# Patient Record
Sex: Male | Born: 1935
Health system: Southern US, Community
[De-identification: ages and names within clinical notes are randomized; demographics above are authoritative.]

## PROBLEM LIST (undated history)

## (undated) DIAGNOSIS — E785 Hyperlipidemia, unspecified: Secondary | ICD-10-CM

## (undated) DIAGNOSIS — M810 Age-related osteoporosis without current pathological fracture: Secondary | ICD-10-CM

## (undated) DIAGNOSIS — R634 Abnormal weight loss: Secondary | ICD-10-CM

## (undated) DIAGNOSIS — J449 Chronic obstructive pulmonary disease, unspecified: Secondary | ICD-10-CM

## (undated) HISTORY — DX: Age-related osteoporosis without current pathological fracture: M81.0

## (undated) HISTORY — DX: Abnormal weight loss: R63.4

## (undated) HISTORY — DX: Hyperlipidemia, unspecified: E78.5

## (undated) HISTORY — DX: Chronic obstructive pulmonary disease, unspecified: J44.9

## (undated) HISTORY — PX: COLONOSCOPY: SHX174

## (undated) HISTORY — PX: INNER EAR SURGERY: SHX679

---

## 2005-03-17 ENCOUNTER — Ambulatory Visit: Payer: Self-pay | Admitting: Internal Medicine

## 2005-04-06 ENCOUNTER — Ambulatory Visit: Payer: Self-pay | Admitting: Internal Medicine

## 2005-04-14 ENCOUNTER — Ambulatory Visit: Payer: Self-pay | Admitting: Internal Medicine

## 2005-05-05 ENCOUNTER — Ambulatory Visit: Payer: Self-pay | Admitting: Internal Medicine

## 2005-08-19 ENCOUNTER — Ambulatory Visit: Payer: Self-pay | Admitting: Internal Medicine

## 2005-12-15 ENCOUNTER — Ambulatory Visit: Payer: Self-pay | Admitting: Internal Medicine

## 2009-06-11 ENCOUNTER — Ambulatory Visit: Payer: Self-pay | Admitting: Family Medicine

## 2009-06-11 DIAGNOSIS — R319 Hematuria, unspecified: Secondary | ICD-10-CM | POA: Insufficient documentation

## 2009-06-11 DIAGNOSIS — M545 Low back pain, unspecified: Secondary | ICD-10-CM | POA: Insufficient documentation

## 2009-06-11 DIAGNOSIS — N529 Male erectile dysfunction, unspecified: Secondary | ICD-10-CM | POA: Insufficient documentation

## 2009-06-11 DIAGNOSIS — L821 Other seborrheic keratosis: Secondary | ICD-10-CM | POA: Insufficient documentation

## 2009-06-11 LAB — CONVERTED CEMR LAB
Glucose, Urine, Semiquant: NEGATIVE
Ketones, urine, test strip: NEGATIVE
Specific Gravity, Urine: <1.005
pH: 6

## 2009-06-13 ENCOUNTER — Encounter: Payer: Self-pay | Admitting: Family Medicine

## 2009-06-15 LAB — CONVERTED CEMR LAB
Bacteria, UA: NONE SEEN
RBC / HPF: NONE SEEN (ref <3)

## 2009-06-19 ENCOUNTER — Encounter: Payer: Self-pay | Admitting: Family Medicine

## 2009-06-22 LAB — CONVERTED CEMR LAB
AST: 22 units/L (ref 0–37)
Albumin: 4.4 g/dL (ref 3.5–5.2)
Alkaline Phosphatase: 53 units/L (ref 39–117)
BUN: 25 mg/dL — ABNORMAL HIGH (ref 6–23)
Basophils Absolute: 0 10*3/uL (ref 0.0–0.1)
Basophils Relative: 1 % (ref 0–1)
Eosinophils Relative: 3 % (ref 0–5)
Glucose, Bld: 107 mg/dL — ABNORMAL HIGH (ref 70–99)
HCT: 43.6 % (ref 39.0–52.0)
Lymphocytes Relative: 42 % (ref 12–46)
MCHC: 33.3 g/dL (ref 30.0–36.0)
Neutro Abs: 2.6 10*3/uL (ref 1.7–7.7)
PSA: 2.48 ng/mL (ref 0.10–4.00)
Platelets: 229 10*3/uL (ref 150–400)
Potassium: 4.6 meq/L (ref 3.5–5.3)
RDW: 14.2 % (ref 11.5–15.5)
Total Bilirubin: 0.5 mg/dL (ref 0.3–1.2)

## 2010-08-13 ENCOUNTER — Encounter: Payer: Self-pay | Admitting: Family Medicine

## 2010-08-24 ENCOUNTER — Encounter: Admission: RE | Admit: 2010-08-24 | Discharge: 2010-08-24 | Payer: Self-pay | Admitting: Family Medicine

## 2010-08-24 ENCOUNTER — Ambulatory Visit: Payer: Self-pay | Admitting: Family Medicine

## 2010-08-24 DIAGNOSIS — M25519 Pain in unspecified shoulder: Secondary | ICD-10-CM | POA: Insufficient documentation

## 2010-09-01 ENCOUNTER — Telehealth: Payer: Self-pay | Admitting: Family Medicine

## 2010-09-01 DIAGNOSIS — M810 Age-related osteoporosis without current pathological fracture: Secondary | ICD-10-CM

## 2010-09-01 HISTORY — DX: Age-related osteoporosis without current pathological fracture: M81.0

## 2010-09-03 ENCOUNTER — Ambulatory Visit: Payer: Self-pay | Admitting: Family Medicine

## 2010-09-06 LAB — CONVERTED CEMR LAB: Vit D, 25-Hydroxy: 55 ng/mL (ref 30–89)

## 2010-09-23 ENCOUNTER — Encounter: Payer: Self-pay | Admitting: Family Medicine

## 2010-11-23 NOTE — Consult Note (Signed)
Summary: [facility]  [facility]   Imported By: Maryln Gottron 10/01/2010 13:59:58  _____________________________________________________________________  External Attachment:    Type:   Image     Comment:   External Document

## 2010-11-23 NOTE — Progress Notes (Signed)
Summary: DEXA results  Phone Note Outgoing Call   Summary of Call: Call pt: Had to call [facility] to get rull bone density report. after his appt I realized I didn't have complete report.  Even though his t score in his spine was OK, the T score in his hip was low in the osteoporosis range. Make sure taking calcium and vitamin D two times a day . 1200mg  of calcium totoal and 1000iu of vitamin D daily.  In addition I do recommend a bidphosphonate for 3-5 years. Do rec we check a vit D level. Can fax slip.  Initial call taken by: Nani Gasser MD,  September 01, 2010 1:42 PM  Follow-up for Phone Call        spoke with pt's wife and she will notify pt Follow-up by: Avon Gully CMA, Duncan Dull),  September 02, 2010 8:19 AM  New Problems: OSTEOPOROSIS (ICD-733.00)   New Problems: OSTEOPOROSIS (ICD-733.00) New/Updated Medications: ALENDRONATE SODIUM 70 MG TABS (ALENDRONATE SODIUM) one tab by mouth Q week. Prescriptions: ALENDRONATE SODIUM 70 MG TABS (ALENDRONATE SODIUM) one tab by mouth Q week.  #4 x 11   Entered and Authorized by:   Nani Gasser MD   Signed by:   Nani Gasser MD on 09/01/2010   Method used:   Electronically to        [facility] [phone]* (retail)       [address] [place], [place]  [address]       Ph: [phone] or [phone]       Fax: [phone]   RxID:   [id number]

## 2010-11-23 NOTE — Assessment & Plan Note (Signed)
Summary: ELBOW/SHOULDER PAIN-   Vital Signs:  Patient profile:   77 year old male Height:      68 inches Weight:      145 pounds BMI:     22.13 Pulse rate:   85 / minute BP sitting:   148 / 81  (right arm) Cuff size:   regular  Vitals Entered By: Avon Gully CMA, Duncan Dull) (August 24, 2010 3:50 PM) CC: rt shoulder pain and rt elbow pain, hurts every am then doesnt hurt during the day, rt arm feel numb at times   Primary Care Provider:  Nani Gasser, MD  CC:  rt shoulder pain and rt elbow pain, hurts every am then doesnt hurt during the day, and rt arm feel numb at times.  History of Present Illness: rt shoulder pain and rt elbow pain, hurts every am then doesnt hurt during the day, rt arm feel numb at times.  Pain started about 2 weeks ago. No rash.  Waking up around 3 AM  with pain on the posterior shoulder.  And also pain over the elbow. Better after gets moving. No pain or limations of ROm during the day.  Taking IBU. No swelling redness. No trauma or injury.  Tramadol din't help. Most left sided. numbnes on the inside of the forearm that started about a week ago.  Was seen at [facility] about  week ago and had a fairly normal Left elbow and shoudler plain films that only showed mild hypertrophic changes.    Current Medications (verified): 1)  Mv 2)  Fish Oil 1200 Mg Caps (Omega-3 Fatty Acids) .... Take 1 Tablet By Mouth Once A Day 3)  Vit C  Allergies (verified): No Known Drug Allergies  Comments:  Nurse/Medical Assistant: The patient's medications and allergies were reviewed with the patient and were updated in the Medication and Allergy Lists. Avon Gully CMA, Duncan Dull) (August 24, 2010 3:52 PM)  Physical Exam  General:  Well-developed,well-nourished,in no acute distress; alert,appropriate and cooperative throughout examination Msk:  Right shoulder with NROM. NOntender. No swelling or redness over the joint.    Impression &  Recommendations:  Problem # 1:  SHOULDER PAIN (ICD-719.41) BAsed on his sxs and the numbness I would suspect nerve impingement form his neck.  Lets start with plain film and if normal then will tx wiht exercises, IBU, and can use the hydrocone at bedtime. If not better after 2-3 weeks then consider Nerve conduction vs cervical MRI for better evaluation.  Reviewed report form his xrays.  His updated medication list for this problem includes:    Hydrocodone-acetaminophen 5-500 Mg Tabs (Hydrocodone-acetaminophen) ..... One by mouth at bedtime as needed pain.  Orders: T-DG Cervical Spine 2-3 Views (04540)  Complete Medication List: 1)  Mv  2)  Fish Oil 1200 Mg Caps (Omega-3 fatty acids) .... Take 1 tablet by mouth once a day 3)  Vit C  4)  Hydrocodone-acetaminophen 5-500 Mg Tabs (Hydrocodone-acetaminophen) .... One by mouth at bedtime as needed pain. Prescriptions: HYDROCODONE-ACETAMINOPHEN 5-500 MG TABS (HYDROCODONE-ACETAMINOPHEN) one by mouth at bedtime as needed pain.  #30 x 0   Entered and Authorized by:   Nani Gasser MD   Signed by:   Nani Gasser MD on 08/24/2010   Method used:   Print then Give to Patient   RxID:   [id number]    Orders Added: 1)  T-DG Cervical Spine 2-3 Views [72040] 2)  Est. Patient Level III [08657]  Appended Document: ELBOW/SHOULDER PAIN-  Preventive Care Screening  Bone Density:    Date:  08/13/2010    Next Due:  08/2012    Results:  abnormal    Prevention & Chronic Care Immunizations   Influenza vaccine: Not documented    Tetanus booster: 10/24/2005: Historical   Tetanus booster due: 10/25/2015    Pneumococcal vaccine: Historical  (10/24/2005)   Pneumococcal vaccine due: None    H. zoster vaccine: Not documented  Colorectal Screening   Hemoccult: Not documented   Hemoccult due: Not Indicated    Colonoscopy: Not documented  Other Screening   PSA: 2.48  (06/19/2009)   Smoking status: current   (06/11/2009)  Lipids   Total Cholesterol: Not documented   LDL: Not documented   LDL Direct: Not documented   HDL: Not documented   Triglycerides: Not documented

## 2010-11-23 NOTE — Assessment & Plan Note (Signed)
Summary: Arm pain, osteoporosis   Vital Signs:  Patient profile:   77 year old male Height:      68 inches Weight:      145 pounds Pulse rate:   134 / minute BP sitting:   141 / 81  (right arm) Cuff size:   regular  Vitals Entered By: Avon Gully CMA, Duncan Dull) (September 03, 2010 2:44 PM) CC: rt arm still hurting and feels numb   Primary Care Provider:  Nani Gasser, MD  CC:  rt arm still hurting and feels numb.  History of Present Illness: rt arm still hurting and feels numb Did review his cerlvical spine films. he has been using NSAIDs and doing some stretching exercises.Feelt it is imrpving some.  Still some numbness in the medial forearm right labia lid.  He is no pain over the elbow itself.  Still some shoulder pain.his symptoms are still worse at night.    Current Medications (verified): 1)  Mv 2)  Fish Oil 1200 Mg Caps (Omega-3 Fatty Acids) .... Take 1 Tablet By Mouth Once A Day 3)  Vit C 4)  Hydrocodone-Acetaminophen 5-500 Mg Tabs (Hydrocodone-Acetaminophen) .... One By Mouth At Bedtime As Needed Pain. 5)  Alendronate Sodium 70 Mg Tabs (Alendronate Sodium) .... One Tab By Mouth Q Week.  Allergies (verified): No Known Drug Allergies  Comments:  Nurse/Medical Assistant: The patient's medications and allergies were reviewed with the patient and were updated in the Medication and Allergy Lists. Avon Gully CMA, Duncan Dull) (September 03, 2010 2:44 PM)  Physical Exam  General:  Well-developed,well-nourished,in no acute distress; alert,appropriate and cooperative throughout examination Msk:  left elbow with normal range of motion and strength.  Slight decreased sensation over the medial area of the forearm below the elbow.  Strength was normal in the wrist, with normal motion.  Finger strength 5/5.  No tenderness over the antecubital fossa or the bony landmarks of the elbow.  Neck with normal range of motion negative Spurling's.  Shoulder with improved range of  motion.   Impression & Recommendations:  Problem # 1:  SHOULDER PAIN (ICD-719.41) assembly of his pain and numbness is coming more from his neck.  It makes sense that at night while he sleeping in his neck as possibly tilting and in one direction or the other that it may be causing some impingement.  That makes the numbness worse.  He certainly has significant arthritis on his cervical x-ray.  I will refer him to sports medicine/orthopedics for further evaluation.  In the meantime he can use his NSAIDs p.r.n. His updated medication list for this problem includes:    Hydrocodone-acetaminophen 5-500 Mg Tabs (Hydrocodone-acetaminophen) ..... One by mouth at bedtime as needed pain.  Orders: Orthopedic Referral (Ortho)  Problem # 2:  OSTEOPOROSIS (ICD-733.00) I did go over his results with him.  He is 30 pickup his bisphosphonate and took his first dose this morning.  Encouraged to take calcium with vitamin D as well.  He did pepper his vitamin D serum level earlier today.  Repeat DEXA in two years.  Plan to continue bisphosphonate for 3 to 5 years. His updated medication list for this problem includes:    Alendronate Sodium 70 Mg Tabs (Alendronate sodium) ..... One tab by mouth q week.  Complete Medication List: 1)  Mv  2)  Fish Oil 1200 Mg Caps (Omega-3 fatty acids) .... Take 1 tablet by mouth once a day 3)  Vit C  4)  Hydrocodone-acetaminophen 5-500 Mg Tabs (Hydrocodone-acetaminophen) .Marland KitchenMarland KitchenMarland Kitchen  One by mouth at bedtime as needed pain. 5)  Alendronate Sodium 70 Mg Tabs (Alendronate sodium) .... One tab by mouth q week.   Orders Added: 1)  Orthopedic Referral [Ortho] 2)  Est. Patient Level III [11914]

## 2010-12-16 ENCOUNTER — Encounter: Payer: Self-pay | Admitting: Family Medicine

## 2010-12-16 ENCOUNTER — Ambulatory Visit (INDEPENDENT_AMBULATORY_CARE_PROVIDER_SITE_OTHER): Payer: Medicare Other | Admitting: Family Medicine

## 2010-12-16 DIAGNOSIS — R05 Cough: Secondary | ICD-10-CM

## 2010-12-16 DIAGNOSIS — R059 Cough, unspecified: Secondary | ICD-10-CM

## 2010-12-16 DIAGNOSIS — R51 Headache: Secondary | ICD-10-CM

## 2010-12-21 NOTE — Assessment & Plan Note (Signed)
Summary: HA, slight cough   Vital Signs:  Patient profile:   77 year old male Height:      68 inches Weight:      143 pounds O2 Sat:      95 % Temp:     97.9 degrees F oral Pulse rate:   71 / minute BP sitting:   102 / 59  (right arm) Cuff size:   regular  Vitals Entered By: Avon Gully CMA, Duncan Dull) (December 16, 2010 3:23 PM) CC: HA since yesterday,feels bad,cough   Primary Care Provider:  Nani Gasser, MD  CC:  HA since yesterday, feels bad, and cough.  History of Present Illness: HA since yesterday,feels bad,cough. NO myalgia. Severe HA and is pounding. No nasal congestions.  Slight cough, dry. Chills. Taking Tyelnol. NO SOB. NO ear pain.  No GI sxs.  HA over the whole head. Felt like a pressure. NO runny nose. Daughte had similar sxs.  No other sxs.   Current Medications (verified): 1)  Mv 2)  Fish Oil 1200 Mg Caps (Omega-3 Fatty Acids) .... Take 1 Tablet By Mouth Once A Day 3)  Vit C 4)  Alendronate Sodium 70 Mg Tabs (Alendronate Sodium) .... One Tab By Mouth Q Week.  Allergies (verified): No Known Drug Allergies  Comments:  Nurse/Medical Assistant: The patient's medications and allergies were reviewed with the patient and were updated in the Medication and Allergy Lists. Avon Gully CMA, Duncan Dull) (December 16, 2010 3:24 PM)  Physical Exam  General:  Well-developed,well-nourished,in no acute distress; alert,appropriate and cooperative throughout examination Head:  Normocephalic and atraumatic without obvious abnormalities. No apparent alopecia or balding. Eyes:  No corneal or conjunctival inflammation noted. EOMI. Perrla.  Ears:  External ear exam shows no significant lesions or deformities.  Otoscopic examination reveals clear canals, tympanic membranes are intact bilaterally without bulging, retraction, inflammation or discharge. Hearing is grossly normal bilaterally. Nose:  External nasal examination shows no deformity or inflammation.  Mouth:  Oral  mucosa and oropharynx without lesions or exudates.  Teeth in good repair. Neck:  No deformities, masses, or tenderness noted. Lungs:  Normal respiratory effort, chest expands symmetrically. Lungs are clear to auscultation, Coarse BS, no crackles or wheezes. Heart:  Normal rate and regular rhythm. S1 and S2 normal without gallop, murmur, click, rub or other extra sounds. Pulses:  Radial 2+  Neurologic:  alert & oriented X3 and gait normal.   Skin:  no rashes.   Cervical Nodes:  No lymphadenopathy noted Psych:  Cognition and judgment appear intact. Alert and cooperative with normal attention span and concentration. No apparent delusions, illusions, hallucinations   Impression & Recommendations:  Problem # 1:  HEADACHE (ICD-784.0) LIkely viral etiiology since also getting chills. Can use the Tyelnol for his HA since does seem to help and he is actually feeling some better today. Exam is normal. GAve reassurance that there is not sign of bronchitis or PNA. Pulse ox is at baseline.  The following medications were removed from the medication list:    Hydrocodone-acetaminophen 5-500 Mg Tabs (Hydrocodone-acetaminophen) ..... One by mouth at bedtime as needed pain.  Problem # 2:  COUGH (ICD-786.2) Cough is slight. No sure is early sxs of viral illnes. Asked him to call if gets worse wtih cough or gets SOB.  Exam is normal except for coasrse BS which is his baseline. Pulse ox at baseline as well.  No sign of bronchitis or PNA on exam.   Complete Medication List: 1)  Mv  2)  Fish Oil 1200 Mg Caps (Omega-3 fatty acids) .... Take 1 tablet by mouth once a day 3)  Vit C  4)  Alendronate Sodium 70 Mg Tabs (Alendronate sodium) .... One tab by mouth q week.  Patient Instructions: 1)  Call if gets worse or not better on Monday.    Orders Added: 1)  Est. Patient Level III [85462]    Prevention & Chronic Care Immunizations   Influenza vaccine: Not documented    Tetanus booster: 10/24/2005:  Historical   Tetanus booster due: 10/25/2015    Pneumococcal vaccine: Historical  (10/24/2005)   Pneumococcal vaccine due: None    H. zoster vaccine: Not documented  Colorectal Screening   Hemoccult: Not documented   Hemoccult due: Not Indicated    Colonoscopy: Not documented  Other Screening   PSA: 2.48  (06/19/2009)   PSA due due: 06/19/2010   Smoking status: current  (06/11/2009)  Lipids   Total Cholesterol: Not documented   LDL: Not documented   LDL Direct: Not documented   HDL: Not documented   Triglycerides: Not documented

## 2012-05-14 DIAGNOSIS — R972 Elevated prostate specific antigen [PSA]: Secondary | ICD-10-CM | POA: Diagnosis not present

## 2014-03-19 DIAGNOSIS — H2510 Age-related nuclear cataract, unspecified eye: Secondary | ICD-10-CM | POA: Diagnosis not present

## 2014-12-12 DIAGNOSIS — H6123 Impacted cerumen, bilateral: Secondary | ICD-10-CM | POA: Diagnosis not present

## 2014-12-15 ENCOUNTER — Encounter: Payer: Self-pay | Admitting: Family Medicine

## 2014-12-15 ENCOUNTER — Ambulatory Visit (INDEPENDENT_AMBULATORY_CARE_PROVIDER_SITE_OTHER): Payer: Medicare Other

## 2014-12-15 ENCOUNTER — Ambulatory Visit (INDEPENDENT_AMBULATORY_CARE_PROVIDER_SITE_OTHER): Payer: Medicare Other | Admitting: Family Medicine

## 2014-12-15 ENCOUNTER — Encounter (INDEPENDENT_AMBULATORY_CARE_PROVIDER_SITE_OTHER): Payer: Self-pay

## 2014-12-15 VITALS — BP 124/67 | HR 70 | Ht 69.0 in | Wt 136.0 lb

## 2014-12-15 DIAGNOSIS — R05 Cough: Secondary | ICD-10-CM

## 2014-12-15 DIAGNOSIS — J439 Emphysema, unspecified: Secondary | ICD-10-CM | POA: Insufficient documentation

## 2014-12-15 DIAGNOSIS — J449 Chronic obstructive pulmonary disease, unspecified: Secondary | ICD-10-CM

## 2014-12-15 DIAGNOSIS — R059 Cough, unspecified: Secondary | ICD-10-CM

## 2014-12-15 DIAGNOSIS — M810 Age-related osteoporosis without current pathological fracture: Secondary | ICD-10-CM | POA: Diagnosis not present

## 2014-12-15 DIAGNOSIS — Z0000 Encounter for general adult medical examination without abnormal findings: Secondary | ICD-10-CM

## 2014-12-15 DIAGNOSIS — H6123 Impacted cerumen, bilateral: Secondary | ICD-10-CM | POA: Diagnosis not present

## 2014-12-15 LAB — COMPLETE METABOLIC PANEL WITH GFR
ALT: 17 U/L (ref 0–53)
AST: 22 U/L (ref 0–37)
Albumin: 4 g/dL (ref 3.5–5.2)
Alkaline Phosphatase: 54 U/L (ref 39–117)
BILIRUBIN TOTAL: 0.7 mg/dL (ref 0.2–1.2)
BUN: 25 mg/dL — AB (ref 6–23)
CHLORIDE: 102 meq/L (ref 96–112)
CO2: 31 mEq/L (ref 19–32)
CREATININE: 1.14 mg/dL (ref 0.50–1.35)
Calcium: 9.5 mg/dL (ref 8.4–10.5)
GFR, EST AFRICAN AMERICAN: 71 mL/min
GFR, Est Non African American: 61 mL/min
GLUCOSE: 97 mg/dL (ref 70–99)
POTASSIUM: 4.5 meq/L (ref 3.5–5.3)
Sodium: 140 mEq/L (ref 135–145)
Total Protein: 6.8 g/dL (ref 6.0–8.3)

## 2014-12-15 LAB — LIPID PANEL
CHOLESTEROL: 203 mg/dL — AB (ref 0–200)
HDL: 88 mg/dL (ref >=40)
LDL Cholesterol: 97 mg/dL (ref 0–99)
Total CHOL/HDL Ratio: 2.3 Ratio
Triglycerides: 90 mg/dL (ref <150)
VLDL: 18 mg/dL (ref 0–40)

## 2014-12-15 NOTE — Progress Notes (Signed)
CC: Timothy Waller is a 81 y.o. male is here for Establish Care   Subjective: HPI:  Colonoscopy: Most recent 5 years ago reportedly normal, requesting outside records. Prostate: Discussed screening risks/beneifts with patient today, no known risk of abnormals, he would like PSA checked today   AAA Screen: Unsure if this is ever been done, requesting outside records  DEXA: History of osteoporosis with last DEXA scan 5 years ago, orders placed today  Influenza Vaccine: Up-to-date Pneumovax: Up-to-date Td/Tdap: Up-to-date Zoster: Already received  Very pleasant 81 year old here to establish care, he is requesting complete physical exam and also has bilateral hearing loss with thick wax discharge. He's had difficulty with cerumen impactions multiple times in the past and was seen for this at a local urgent care clinic where they were unable to remove the impactions bilaterally.  He also complains of difficulty gaining weight that has been present for the majority of his adult life. He denies any weight loss but tells me he can eat whatever he wants never gains a pound.  He is a smoker and has a daily cough he does not think he's ever had a chest x-ray. Cough has been present for at least 3 months now  Review of Systems - General ROS: negative for - chills, fever, night sweats, weight gain or weight loss Ophthalmic ROS: negative for - decreased vision Psychological ROS: negative for - anxiety or depression ENT ROS: negative for - , nasal congestion, tinnitus or allergies Hematological and Lymphatic ROS: negative for - bleeding problems, bruising or swollen lymph nodes Breast ROS: negative Respiratory ROS: no , shortness of breath, or wheezing Cardiovascular ROS: no chest pain or dyspnea on exertion Gastrointestinal ROS: no abdominal pain, change in bowel habits, or black or bloody stools Genito-Urinary ROS: negative for - genital discharge, genital ulcers, incontinence or abnormal  bleeding from genitals Musculoskeletal ROS: negative for - joint pain or muscle pain Neurological ROS: negative for - headaches or memory loss Dermatological ROS: negative for lumps, mole changes, rash and skin lesion changes  Past Medical History  Diagnosis Date  . Osteoporosis 09/01/2010    Qualifier: Diagnosis of  By: McCrimmon CMA, (AAMA), Seth Bake      No past surgical history on file. No family history on file.  History   Social History  . Marital Status: Married    Spouse Name: N/A  . Number of Children: N/A  . Years of Education: N/A   Occupational History  . Not on file.   Social History Main Topics  . Smoking status: Current Every Day Smoker -- 0.50 packs/day for 20 years    Types: Cigarettes  . Smokeless tobacco: Not on file  . Alcohol Use: Not on file  . Drug Use: No  . Sexual Activity: Not Currently   Other Topics Concern  . Not on file   Social History Narrative  . No narrative on file     Objective: BP 124/67 mmHg  Pulse 70  Ht 5\' 9"  (1.753 m)  Wt 136 lb (61.689 kg)  BMI 20.07 kg/m2  General: No Acute Distress HEENT: Atraumatic, normocephalic, conjunctivae normal without scleral icterus.  No nasal discharge, on initial exam hearing is moderately impaired and both ear canals have cerumen impactions, following removal of the impactions hearing grossly intact, TMs with good landmarks bilaterally with no middle ear abnormalities, posterior pharynx clear without oral lesions. Neck: Supple, trachea midline, no cervical nor supraclavicular adenopathy. Pulmonary: Comfortable breathing with trace end expiratory wheezing in the  lower lung fields. No rhonchi nor rales. Cardiac: Regular rate and rhythm.  No murmurs, rubs, nor gallops. No peripheral edema.  2+ peripheral pulses bilaterally. Abdomen: Bowel sounds normal.  No masses.  Non-tender without rebound.  Negative Murphy's sign. MSK: Grossly intact, no signs of weakness.  Full strength throughout upper and  lower extremities.  Full ROM in upper and lower extremities.  No midline spinal tenderness. Neuro: Gait unremarkable, CN II-XII grossly intact.  C5-C6 Reflex 2/4 Bilaterally, L4 Reflex 2/4 Bilaterally.  Cerebellar function intact. Skin: No rashes. Psych: Alert and oriented to person/place/time.  Thought process normal. No anxiety/depression.  Assessment & Plan: Timothy Waller was seen today for establish care.  Diagnoses and all orders for this visit:  Annual physical exam Orders: -     Lipid panel -     COMPLETE METABOLIC PANEL WITH GFR -     PSA  Cerumen impaction, bilateral  Osteoporosis Orders: -     DG Bone Density; Future  Cough Orders: -     DG Chest 2 View; Future   Indication: Cerumen impaction of the ears Medical necessity statement: On physical examination, cerumen impairs clinically significant portions of the external auditory canal, and tympanic membrane. Noted obstructive, copious cerumen that cannot be removed without magnification and instrumentations requiring physician skills Consent: Discussed benefits and risks of procedure and verbal consent obtained Procedure: Patient was prepped for the procedure. Utilized an otoscope to assess and take note of the ear canal, the tympanic membrane, and the presence, amount, and placement of the cerumen. Gentle water irrigation and soft plastic curette was utilized to remove cerumen.  Post procedure examination: shows cerumen was completely removed. Patient tolerated procedure well. The patient is made aware that they may experience temporary vertigo, temporary hearing loss, and temporary discomfort. If these symptom last for more than 24 hours to call the clinic or proceed to the ED.   Healthy lifestyle interventions including but not limited to regular exercise, a healthy low fat diet, moderation of salt intake, the dangers of tobacco/alcohol/recreational drug use, nutrition supplementation, and accident avoidance were discussed  with the patient and a handout was provided for future reference.  Provided with stool cards pending most recent colonoscopy records. Checking an x-ray given difficulty losing weight and chronic cough to screen for lung cancer.  Return in about 3 months (around 03/15/2015).

## 2014-12-16 ENCOUNTER — Telehealth: Payer: Self-pay | Admitting: Family Medicine

## 2014-12-16 DIAGNOSIS — E785 Hyperlipidemia, unspecified: Secondary | ICD-10-CM

## 2014-12-16 LAB — PSA: PSA: 3.89 ng/mL (ref <=4.00)

## 2014-12-16 MED ORDER — ATORVASTATIN CALCIUM 10 MG PO TABS: 10.0000 mg | ORAL_TABLET | Freq: Every day | ORAL | Status: DC

## 2014-12-16 NOTE — Telephone Encounter (Signed)
Timothy Waller, Will you please let patient know tha this kidney function, liver function, blood sugar and PSA prostate test were all normal.  His cholesterol values and age would put him at a 25% risk of having a heart attack in the next 10 years.  I would recommend that he start a cholesterol lowering medication called atorvastatin to help reduce this risk.  Rx sent to [facility] on [address].

## 2014-12-16 NOTE — Telephone Encounter (Signed)
Pt.notified

## 2015-02-13 ENCOUNTER — Other Ambulatory Visit: Payer: Medicare Other | Admitting: Family Medicine

## 2015-02-20 ENCOUNTER — Ambulatory Visit (INDEPENDENT_AMBULATORY_CARE_PROVIDER_SITE_OTHER): Payer: Medicare Other | Admitting: Family Medicine

## 2015-02-20 VITALS — BP 144/68 | HR 62 | Ht 69.0 in | Wt 134.0 lb

## 2015-02-20 DIAGNOSIS — J439 Emphysema, unspecified: Secondary | ICD-10-CM | POA: Diagnosis not present

## 2015-02-20 DIAGNOSIS — R05 Cough: Secondary | ICD-10-CM

## 2015-02-20 DIAGNOSIS — R059 Cough, unspecified: Secondary | ICD-10-CM

## 2015-02-20 DIAGNOSIS — Z1211 Encounter for screening for malignant neoplasm of colon: Secondary | ICD-10-CM | POA: Diagnosis not present

## 2015-02-20 MED ORDER — ALBUTEROL SULFATE (2.5 MG/3ML) 0.083% IN NEBU
2.5000 mg | INHALATION_SOLUTION | Freq: Once | RESPIRATORY_TRACT | Status: AC
  Administered 2015-02-20: 2.5 mg via RESPIRATORY_TRACT

## 2015-02-20 MED ORDER — BUDESONIDE-FORMOTEROL FUMARATE 160-4.5 MCG/ACT IN AERO: 2.0000 | INHALATION_SPRAY | Freq: Two times a day (BID) | RESPIRATORY_TRACT | Status: DC

## 2015-02-20 NOTE — Progress Notes (Signed)
CC: Timothy Waller is a 81 y.o. male is here for No chief complaint on file.   Subjective: HPI:  Follow-up chronic cough: He continues to have a daily cough that is nonproductive occurring multiple times each hour of the day. No nocturnal symptoms. He's cutting back on smoking. Cough is moderate in severity nothing particularly makes it better or worse. He denies any degree of shortness of breath whatsoever at rest or even with exertion. Denies chest pain wheezing, blood in sputum, orthopnea peripheral edema fevers or chills   Review Of Systems Outlined In HPI  Past Medical History  Diagnosis Date  . Osteoporosis 09/01/2010    Qualifier: Diagnosis of  By: McCrimmon CMA, (AAMA), Seth Bake      No past surgical history on file. Family History  Problem Relation Age of Onset  . Cancer Father     mouth cancer     History   Social History  . Marital Status: Married    Spouse Name: N/A  . Number of Children: N/A  . Years of Education: N/A   Occupational History  . Not on file.   Social History Main Topics  . Smoking status: Current Every Day Smoker -- 0.50 packs/day for 20 years    Types: Cigarettes  . Smokeless tobacco: Not on file  . Alcohol Use: Not on file  . Drug Use: No  . Sexual Activity: Not Currently   Other Topics Concern  . Not on file   Social History Narrative  . No narrative on file     Objective: BP 144/68 mmHg  Pulse 62  Ht 5\' 9"  (1.753 m)  Wt 134 lb (60.782 kg)  BMI 19.78 kg/m2  SpO2 96%  General: Alert and Oriented, No Acute Distress HEENT: Pupils equal, round, reactive to light. Conjunctivae clear.  Moist mucous membranes Lungs: Clear to auscultation bilaterally, no wheezing/ronchi/rales.  Comfortable work of breathing. Good air movement. Cardiac: Regular rate and rhythm. Normal S1/S2.  No murmurs, rubs, nor gallops.   Extremities: No peripheral edema.  Strong peripheral pulses.  Mental Status: No depression, anxiety, nor agitation. Skin: Warm and  dry.  Assessment & Plan: Diagnoses and all orders for this visit:  Cough Orders: -     Spirometry with graph -     albuterol (PROVENTIL) (2.5 MG/3ML) 0.083% nebulizer solution 2.5 mg; Take 3 mLs (2.5 mg total) by nebulization once.  Pulmonary emphysema, unspecified emphysema type  Special screening for malignant neoplasms, colon Orders: -     Ambulatory referral to Gastroenterology  Other orders -     budesonide-formoterol (SYMBICORT) 160-4.5 MCG/ACT inhaler; Inhale 2 puffs into the lungs 2 (two) times daily.   Cough: Pulmonary function testing was obtained showing severe airway obstruction with low vital capacity. No improvement objectively with bronchodilator however he reports significant improvement with a sensation of decreased cough and lung expansion. Encouraged trial of Symbicort and if tolerated please call for formal prescription. Emphasized the importance of complete smoking cessation to minimize further decline in lung function and lung disease.  He tells me he's ready to go through with a colonoscopy that he believes he is overdue for  25 minutes spent face-to-face during visit today of which at least 50% was counseling or coordinating care regarding: 1. Cough   2. Pulmonary emphysema, unspecified emphysema type   3. Special screening for malignant neoplasms, colon      Return in about 3 months (around 05/22/2015).

## 2015-03-05 ENCOUNTER — Encounter: Payer: Self-pay | Admitting: Family Medicine

## 2015-03-05 DIAGNOSIS — Z1211 Encounter for screening for malignant neoplasm of colon: Secondary | ICD-10-CM | POA: Insufficient documentation

## 2015-04-13 ENCOUNTER — Ambulatory Visit (INDEPENDENT_AMBULATORY_CARE_PROVIDER_SITE_OTHER): Payer: Medicare Other

## 2015-04-13 ENCOUNTER — Encounter: Payer: Self-pay | Admitting: Family Medicine

## 2015-04-13 ENCOUNTER — Ambulatory Visit (INDEPENDENT_AMBULATORY_CARE_PROVIDER_SITE_OTHER): Payer: Medicare Other | Admitting: Family Medicine

## 2015-04-13 VITALS — BP 132/64 | HR 70 | Wt 136.0 lb

## 2015-04-13 DIAGNOSIS — M19041 Primary osteoarthritis, right hand: Secondary | ICD-10-CM | POA: Diagnosis not present

## 2015-04-13 DIAGNOSIS — M7731 Calcaneal spur, right foot: Secondary | ICD-10-CM | POA: Diagnosis not present

## 2015-04-13 DIAGNOSIS — M109 Gout, unspecified: Secondary | ICD-10-CM

## 2015-04-13 DIAGNOSIS — M79671 Pain in right foot: Secondary | ICD-10-CM | POA: Diagnosis not present

## 2015-04-13 MED ORDER — PREDNISONE 20 MG PO TABS
ORAL_TABLET | ORAL | Status: AC
Start: 2015-04-13 — End: 2015-04-26

## 2015-04-13 NOTE — Progress Notes (Signed)
CC: Timothy Waller is a 81 y.o. male is here for Foot Swelling   Subjective: HPI:  Abrupt onset of pain, redness, swelling at the base of the fifth toe on the right foot that began one week ago. No interventions as of yet. Hurts to touch or wear shoes. No pain with weightbearing. He's never had this before. Swelling has slightly improved but redness is persistent. Denies joint pain elsewhere. Denies any recent or remote trauma or any accident to the right foot. Denies fevers, chills, nor swelling of any other parts of the appendages.   Review Of Systems Outlined In HPI  Past Medical History  Diagnosis Date  . Osteoporosis 09/01/2010    Qualifier: Diagnosis of  By: McCrimmon CMA, (AAMA), Seth Bake      No past surgical history on file. Family History  Problem Relation Age of Onset  . Cancer Father     mouth cancer     History   Social History  . Marital Status: Married    Spouse Name: N/A  . Number of Children: N/A  . Years of Education: N/A   Occupational History  . Not on file.   Social History Main Topics  . Smoking status: Current Every Day Smoker -- 0.50 packs/day for 20 years    Types: Cigarettes  . Smokeless tobacco: Not on file  . Alcohol Use: Not on file  . Drug Use: No  . Sexual Activity: Not Currently   Other Topics Concern  . Not on file   Social History Narrative     Objective: BP 132/64 mmHg  Pulse 70  Wt 136 lb (61.689 kg)  Vital signs reviewed. General: Alert and Oriented, No Acute Distress HEENT: Pupils equal, round, reactive to light. Conjunctivae clear.  External ears unremarkable.  Moist mucous membranes. Lungs: Clear and comfortable work of breathing, speaking in full sentences without accessory muscle use. Cardiac: Regular rate and rhythm.  Neuro: CN II-XII grossly intact, gait normal. Extremities: No peripheral edema.  Strong peripheral pulses. Moderate erythema and swelling on the dorsal surface of the base of the right fifth toe. Slightly  tender to the touch moderately warm to the touch. Mental Status: No depression, anxiety, nor agitation. Logical though process. Skin: Warm and dry.  Assessment & Plan: Timothy Waller was seen today for foot swelling.  Diagnoses and all orders for this visit:  Right foot pain Orders: -     DG Foot Complete Right; Future  Gout without tophus, unspecified cause, unspecified chronicity, unspecified site Orders: -     predniSONE (DELTASONE) 20 MG tablet; Three tabs daily days 1-3, two tabs daily days 4-6, one tab daily days 7-9, half tab daily days 10-13.   X-ray was obtained to rule out osteoarthritis or fracture at the base of the fifth toe, x-rays were reassuring supporting a diagnosis of gout. Begin prednisone taper.  Return if symptoms worsen or fail to improve.

## 2016-01-05 ENCOUNTER — Other Ambulatory Visit: Payer: Self-pay | Admitting: Family Medicine

## 2016-01-13 ENCOUNTER — Telehealth: Payer: Self-pay | Admitting: Family Medicine

## 2016-01-13 NOTE — Telephone Encounter (Signed)
I called pt and left message with his wife to call our office and schedule his F/u appt with Dr.Hommel for med refills

## 2016-01-19 ENCOUNTER — Ambulatory Visit (INDEPENDENT_AMBULATORY_CARE_PROVIDER_SITE_OTHER): Payer: Medicare Other | Admitting: Family Medicine

## 2016-01-19 ENCOUNTER — Encounter: Payer: Self-pay | Admitting: Family Medicine

## 2016-01-19 VITALS — BP 155/76 | HR 58 | Wt 131.0 lb

## 2016-01-19 DIAGNOSIS — Z1211 Encounter for screening for malignant neoplasm of colon: Secondary | ICD-10-CM

## 2016-01-19 DIAGNOSIS — J439 Emphysema, unspecified: Secondary | ICD-10-CM

## 2016-01-19 DIAGNOSIS — E785 Hyperlipidemia, unspecified: Secondary | ICD-10-CM | POA: Diagnosis not present

## 2016-01-19 LAB — LIPID PANEL
Cholesterol: 160 mg/dL (ref 125–200)
HDL: 94 mg/dL (ref >=40)
LDL CALC: 57 mg/dL (ref <130)
Total CHOL/HDL Ratio: 1.7 Ratio (ref <=5.0)
Triglycerides: 46 mg/dL (ref <150)
VLDL: 9 mg/dL (ref <30)

## 2016-01-19 NOTE — Progress Notes (Signed)
CC: Timothy Waller is a 82 y.o. male is here for Hyperlipidemia   Subjective: HPI:  Follow-up hyperlipidemia: Ever since starting atorvastatin he denies any right upper quadrant pain or myalgias. He denies any chest pain or limb claudication. No motor or sensory disturbances.  Follow-up COPD: Still smoking but has cut back drastically on his daily cigarette use. He reports a daily cough but no increased sputum or increased coughing frequency. Denies any shortness of breath or wheezing. Rarely using Symbicort  He knows it is due to have a colonoscopy however he has reservations about the procedure due to fears of complications. He wants noninvasive way to screen for colon cancer.   Review Of Systems Outlined In HPI  Past Medical History  Diagnosis Date  . Osteoporosis 09/01/2010    Qualifier: Diagnosis of  By: McCrimmon CMA, (AAMA), Seth Bake      No past surgical history on file. Family History  Problem Relation Age of Onset  . Cancer Father     mouth cancer     Social History   Social History  . Marital Status: Married    Spouse Name: N/A  . Number of Children: N/A  . Years of Education: N/A   Occupational History  . Not on file.   Social History Main Topics  . Smoking status: Current Every Day Smoker -- 0.50 packs/day for 20 years    Types: Cigarettes  . Smokeless tobacco: Not on file  . Alcohol Use: Not on file  . Drug Use: No  . Sexual Activity: Not Currently   Other Topics Concern  . Not on file   Social History Narrative     Objective: BP 155/76 mmHg  Pulse 58  Wt 131 lb (59.421 kg)  General: Alert and Oriented, No Acute Distress HEENT: Pupils equal, round, reactive to light. Conjunctivae clear. Moist mucous membranes Lungs: Clear to auscultation bilaterally, no wheezing/ronchi/rales.  Comfortable work of breathing. Good air movement. Cardiac: Regular rate and rhythm. Normal S1/S2.  No murmurs, rubs, nor gallops.   Extremities: No peripheral edema.   Strong peripheral pulses.  Mental Status: No depression, anxiety, nor agitation. Skin: Warm and dry.  Assessment & Plan: Timothy Waller was seen today for hyperlipidemia.  Diagnoses and all orders for this visit:  Hyperlipidemia -     Lipid panel  Pulmonary emphysema, unspecified emphysema type ([place])  Special screening for malignant neoplasms, colon   Hyperlipidemia: Clinical control due for repeat lipid panel, he'll need 90 day supplies of atorvastatin if controlled COPD: Controlled, congratulated his cutting back on cigarettes. Encouraged complete cessation Ordering Cologuard to screen for colon cancer.  Return in about 6 months (around 07/21/2016).

## 2016-01-20 ENCOUNTER — Telehealth: Payer: Self-pay | Admitting: Family Medicine

## 2016-01-20 MED ORDER — ATORVASTATIN CALCIUM 10 MG PO TABS: ORAL_TABLET | ORAL | Status: DC

## 2016-01-20 NOTE — Telephone Encounter (Signed)
Will you please let patient know that his cholesterol numbers look well controlled and I've sent a years worth of refills to his [facility].

## 2016-01-20 NOTE — Telephone Encounter (Signed)
Wife notified.

## 2016-01-25 DIAGNOSIS — Z1211 Encounter for screening for malignant neoplasm of colon: Secondary | ICD-10-CM | POA: Diagnosis not present

## 2016-01-25 DIAGNOSIS — Z1212 Encounter for screening for malignant neoplasm of rectum: Secondary | ICD-10-CM | POA: Diagnosis not present

## 2016-02-05 LAB — COLOGUARD: Cologuard: POSITIVE

## 2016-02-08 ENCOUNTER — Telehealth: Payer: Self-pay | Admitting: Family Medicine

## 2016-02-08 DIAGNOSIS — R195 Other fecal abnormalities: Secondary | ICD-10-CM

## 2016-02-08 NOTE — Telephone Encounter (Signed)
Pt number on file and emergency contact number are currently not working

## 2016-02-08 NOTE — Telephone Encounter (Signed)
Will you please let patient know that his cologuard test was abnormal and this result can indicate the presence of cancer or a pre-cancerous polyp. The next step in workup is to go through with a colonoscopy so I'll refer him to a gastroenterologist who will help arrange this.

## 2016-02-09 ENCOUNTER — Encounter: Payer: Self-pay | Admitting: Family Medicine

## 2016-02-09 ENCOUNTER — Ambulatory Visit (INDEPENDENT_AMBULATORY_CARE_PROVIDER_SITE_OTHER): Payer: Medicare Other | Admitting: Family Medicine

## 2016-02-09 VITALS — BP 160/75 | HR 62 | Wt 131.0 lb

## 2016-02-09 DIAGNOSIS — M810 Age-related osteoporosis without current pathological fracture: Secondary | ICD-10-CM

## 2016-02-09 DIAGNOSIS — I10 Essential (primary) hypertension: Secondary | ICD-10-CM

## 2016-02-09 NOTE — Telephone Encounter (Signed)
Pt notified during visit  

## 2016-02-09 NOTE — Progress Notes (Signed)
CC: Timothy Waller is a 82 y.o. male is here for Hypertension   Subjective: HPI:  Follow essential hypertension: At home he is consistently seeing blood pressure readings below 130/80. Last week he started to notice if he stands up quickly he'll experience some lightheadedness. Symptoms sudden persistent ever since onset. Mild in severity but affecting quality of life. There's been no changes to his medication regimen. He denies any chest pain, chest discomfort, irregular heartbeat or any other motor or sensory disturbances. Symptoms occur 1 or 2 times a day. He'll check his blood pressure around this time and it's always close to 100/70, he never sees anything above 140/90. His used two separate blood pressure cuffs now that confirm these readings. He tells me he doesn't add salt in his food and he drinks far less than 8,8 ounce glasses of water a day   Review Of Systems Outlined In HPI  Past Medical History  Diagnosis Date  . Osteoporosis 09/01/2010    Qualifier: Diagnosis of  By: McCrimmon CMA, (AAMA), Timothy Waller      No past surgical history on file. Family History  Problem Relation Age of Onset  . Cancer Father     mouth cancer     Social History   Social History  . Marital Status: Married    Spouse Name: N/A  . Number of Children: N/A  . Years of Education: N/A   Occupational History  . Not on file.   Social History Main Topics  . Smoking status: Current Every Day Smoker -- 0.50 packs/day for 20 years    Types: Cigarettes  . Smokeless tobacco: Not on file  . Alcohol Use: Not on file  . Drug Use: No  . Sexual Activity: Not Currently   Other Topics Concern  . Not on file   Social History Narrative     Objective: BP 160/75 mmHg  Pulse 62  Wt 131 lb (59.421 kg)  General: Alert and Oriented, No Acute Distress HEENT: Pupils equal, round, reactive to light. Conjunctivae clear. Moist mucous membranes Lungs: Clear to auscultation bilaterally, no wheezing/ronchi/rales.   Comfortable work of breathing. Good air movement. Cardiac: Regular rate and rhythm. Normal S1/S2.  No murmurs, rubs, nor gallops.  No carotid bruit Extremities: No peripheral edema.  Strong peripheral pulses.  Mental Status: No depression, anxiety, nor agitation. Skin: Warm and dry.  Assessment & Plan: Timothy Waller was seen today for hypertension.  Diagnoses and all orders for this visit:  Essential hypertension  Osteoporosis -     DG Bone Density; Future   Essential hypertension: Elevated here however normotensive at home. His orthostatic symptoms would be worsened if he started on any blood pressure medication therefore holding off on this. I encouraged him to start drinking 8, 8 ounce glasses of water a day and add a teaspoon of salt to his meal over the course of the day. If symptoms do not improve after this for 2-3 days for blood pressure rises well 140/90 please call for further evaluation. Overdue for DEXA scan orders placed  No Follow-up on file.

## 2016-02-10 ENCOUNTER — Encounter: Payer: Self-pay | Admitting: Family Medicine

## 2016-02-16 ENCOUNTER — Encounter: Payer: Self-pay | Admitting: Internal Medicine

## 2016-02-17 ENCOUNTER — Encounter: Payer: Self-pay | Admitting: Family Medicine

## 2016-02-17 ENCOUNTER — Ambulatory Visit (INDEPENDENT_AMBULATORY_CARE_PROVIDER_SITE_OTHER): Payer: Medicare Other

## 2016-02-17 DIAGNOSIS — M85851 Other specified disorders of bone density and structure, right thigh: Secondary | ICD-10-CM

## 2016-02-17 DIAGNOSIS — M8589 Other specified disorders of bone density and structure, multiple sites: Secondary | ICD-10-CM | POA: Diagnosis not present

## 2016-02-17 DIAGNOSIS — M810 Age-related osteoporosis without current pathological fracture: Secondary | ICD-10-CM

## 2016-02-17 DIAGNOSIS — M8580 Other specified disorders of bone density and structure, unspecified site: Secondary | ICD-10-CM | POA: Insufficient documentation

## 2016-03-03 ENCOUNTER — Telehealth: Payer: Self-pay

## 2016-03-03 NOTE — Telephone Encounter (Signed)
Dr Henrene Pastor,      [phone].  COPD.  Colonoscopy with [facility] in 2004.  Will further assess health status @ [facility].  If pt appears frail or @ high risk will contact you again for possible OV.                                                                                                                                                                                                                                          Thank you, Angela/[facility]

## 2016-03-03 NOTE — Telephone Encounter (Signed)
May not need "routine" screening colonoscopy at 59 with comorbidities

## 2016-03-16 ENCOUNTER — Encounter: Payer: Medicare Other | Admitting: Internal Medicine

## 2016-04-19 ENCOUNTER — Ambulatory Visit (AMBULATORY_SURGERY_CENTER): Payer: Self-pay

## 2016-04-19 ENCOUNTER — Encounter: Payer: Medicare Other | Admitting: Internal Medicine

## 2016-04-19 VITALS — Ht 68.0 in | Wt 127.0 lb

## 2016-04-19 DIAGNOSIS — R195 Other fecal abnormalities: Secondary | ICD-10-CM

## 2016-04-19 MED ORDER — NA SULFATE-K SULFATE-MG SULF 17.5-3.13-1.6 GM/177ML PO SOLN: ORAL | Status: DC

## 2016-04-19 NOTE — Progress Notes (Signed)
Per pt, no allergies to soy or egg products.Pt not taking any weight loss meds or using  O2 at home. 

## 2016-05-03 ENCOUNTER — Encounter: Payer: Self-pay | Admitting: Internal Medicine

## 2016-05-03 ENCOUNTER — Ambulatory Visit (AMBULATORY_SURGERY_CENTER): Payer: Medicare Other | Admitting: Internal Medicine

## 2016-05-03 VITALS — BP 126/63 | HR 64 | Temp 98.9°F | Resp 13 | Ht 68.0 in | Wt 127.0 lb

## 2016-05-03 DIAGNOSIS — D122 Benign neoplasm of ascending colon: Secondary | ICD-10-CM | POA: Diagnosis not present

## 2016-05-03 DIAGNOSIS — D120 Benign neoplasm of cecum: Secondary | ICD-10-CM

## 2016-05-03 DIAGNOSIS — K635 Polyp of colon: Secondary | ICD-10-CM | POA: Diagnosis not present

## 2016-05-03 DIAGNOSIS — Z1211 Encounter for screening for malignant neoplasm of colon: Secondary | ICD-10-CM | POA: Diagnosis not present

## 2016-05-03 DIAGNOSIS — R195 Other fecal abnormalities: Secondary | ICD-10-CM | POA: Diagnosis present

## 2016-05-03 MED ORDER — SODIUM CHLORIDE 0.9 % IV SOLN: 500.0000 mL | INTRAVENOUS | Status: DC

## 2016-05-03 NOTE — Progress Notes (Signed)
Called to room to assist during endoscopic procedure.  Patient ID and intended procedure confirmed with present staff. Received instructions for my participation in the procedure from the performing physician.  

## 2016-05-03 NOTE — Op Note (Signed)
[facility] Patient Name: Timothy Waller Procedure Date: 05/03/2016 1:31 PM MRN: [id number] Endoscopist: Docia Chuck. Henrene Pastor , MD Age: 82 Referring MD:  Date of Birth: 1934/09/19 Gender: Male Account #: [id number] Procedure:                Colonoscopy, with cold snare polypectomy x 6 Indications:              Positive Cologuard test. Previous exam 2004 ([facility])                            negative Medicines:                Monitored Anesthesia Care Procedure:                Pre-Anesthesia Assessment:                           - Prior to the procedure, a History and Physical                            was performed, and patient medications and                            allergies were reviewed. The patient's tolerance of                            previous anesthesia was also reviewed. The risks                            and benefits of the procedure and the sedation                            options and risks were discussed with the patient.                            All questions were answered, and informed consent                            was obtained. Prior Anticoagulants: The patient has                            taken no previous anticoagulant or antiplatelet                            agents. ASA Grade Assessment: II - A patient with                            mild systemic disease. After reviewing the risks                            and benefits, the patient was deemed in                            satisfactory condition to undergo the procedure.  After obtaining informed consent, the colonoscope                            was passed under direct vision. Throughout the                            procedure, the patient's blood pressure, pulse, and                            oxygen saturations were monitored continuously. The                            Model PCF-H190DL [id number]) scope was introduced                            through the anus and  advanced to the the cecum,                            identified by appendiceal orifice and ileocecal                            valve. The ileocecal valve, appendiceal orifice,                            and rectum were photographed. The quality of the                            bowel preparation was excellent. The colonoscopy                            was performed with significant difficulty secondary                            to marked sigmoid stenosis. Required exchanging                            standard adult scope for pediatric scope. Still                            difficult with pediatric scope.. The patient                            tolerated the procedure well. The bowel preparation                            used was SUPREP. Scope In: 1:44:57 PM Scope Out: 2:14:24 PM Scope Withdrawal Time: 0 hours 20 minutes 6 seconds  Total Procedure Duration: 0 hours 29 minutes 27 seconds  Findings:                 Six polyps were found in the ascending colon and                            cecum. The polyps were 3 to 5 mm in size. These  polyps were removed with a cold snare. Resection                            and retrieval were complete.                           Multiple diverticula were found in the sigmoid                            colon, with marked rectosigmoid stenosis.                           Internal hemorrhoids were found during retroflexion.                           The exam was otherwise without abnormality on                            direct and retroflexion views. Complications:            No immediate complications. Estimated blood loss:                            None. Estimated Blood Loss:     Estimated blood loss: none. Impression:               - Six 3 to 5 mm polyps in the ascending colon and                            in the cecum, removed with a cold snare. Resected                            and retrieved.                            - Diverticulosis in the sigmoid colon, with marked                            stenosis.                           - Internal hemorrhoids.                           - The examination was otherwise normal on direct                            and retroflexion views. Recommendation:           - Repeat colonoscopy is not recommended for                            surveillance.                           - Patient has a contact number available for  emergencies. The signs and symptoms of potential                            delayed complications were discussed with the                            patient. Return to normal activities tomorrow.                            Written discharge instructions were provided to the                            patient.                           - Resume previous diet.                           - Continue present medications.                           - Await pathology results. Docia Chuck. Henrene Pastor, MD 05/03/2016 2:22:01 PM This report has been signed electronically.

## 2016-05-03 NOTE — Patient Instructions (Signed)
YOU HAD AN ENDOSCOPIC PROCEDURE TODAY AT [facility]:   Refer to the procedure report that was given to you for any specific questions about what was found during the examination.  If the procedure report does not answer your questions, please call your gastroenterologist to clarify.  If you requested that your care partner not be given the details of your procedure findings, then the procedure report has been included in a sealed envelope for you to review at your convenience later.  YOU SHOULD EXPECT: Some feelings of bloating in the abdomen. Passage of more gas than usual.  Walking can help get rid of the air that was put into your GI tract during the procedure and reduce the bloating. If you had a lower endoscopy (such as a colonoscopy or flexible sigmoidoscopy) you may notice spotting of blood in your stool or on the toilet paper. If you underwent a bowel prep for your procedure, you may not have a normal bowel movement for a few days.  Please Note:  You might notice some irritation and congestion in your nose or some drainage.  This is from the oxygen used during your procedure.  There is no need for concern and it should clear up in a day or so.  SYMPTOMS TO REPORT IMMEDIATELY:   Following lower endoscopy (colonoscopy or flexible sigmoidoscopy):  Excessive amounts of blood in the stool  Significant tenderness or worsening of abdominal pains  Swelling of the abdomen that is new, acute  Fever of 100F or higher   For urgent or emergent issues, a gastroenterologist can be reached at any hour by calling [phone].   DIET: Your first meal following the procedure should be a small meal and then it is ok to progress to your normal diet. Heavy or fried foods are harder to digest and may make you feel nauseous or bloated.  Likewise, meals heavy in dairy and vegetables can increase bloating.  Drink plenty of fluids but you should avoid alcoholic beverages for 24  hours.  ACTIVITY:  You should plan to take it easy for the rest of today and you should NOT DRIVE or use heavy machinery until tomorrow (because of the sedation medicines used during the test).    FOLLOW UP: Our staff will call the number listed on your records the next business day following your procedure to check on you and address any questions or concerns that you may have regarding the information given to you following your procedure. If we do not reach you, we will leave a message.  However, if you are feeling well and you are not experiencing any problems, there is no need to return our call.  We will assume that you have returned to your regular daily activities without incident.  If any biopsies were taken you will be contacted by phone or by letter within the next 1-3 weeks.  Please call us at [phone] if you have not heard about the biopsies in 3 weeks.    SIGNATURES/CONFIDENTIALITY: You and/or your care partner have signed paperwork which will be entered into your electronic medical record.  These signatures attest to the fact that that the information above on your After Visit Summary has been reviewed and is understood.  Full responsibility of the confidentiality of this discharge information lies with you and/or your care-partner.  Polyps, diverticulosis, high fiber diet, and hemorrhoid information.

## 2016-05-03 NOTE — Progress Notes (Signed)
A and O x3. Report to RN. Tolerated MAC anesthesia well. 

## 2016-05-04 ENCOUNTER — Telehealth: Payer: Self-pay | Admitting: *Deleted

## 2016-05-04 NOTE — Telephone Encounter (Signed)
  Follow up Call-  Call back number 05/03/2016  Post procedure Call Back phone  # [phone]  Permission to leave phone message Yes     Patient questions:  Do you have a fever, pain , or abdominal swelling? No. Pain Score  0 *  Have you tolerated food without any problems? Yes.    Have you been able to return to your normal activities? Yes.    Do you have any questions about your discharge instructions: Diet   No. Medications  No. Follow up visit  No.  Do you have questions or concerns about your Care? No.  Actions: * If pain score is 4 or above: No action needed, pain <4.

## 2016-05-09 ENCOUNTER — Encounter: Payer: Self-pay | Admitting: Internal Medicine

## 2016-05-10 ENCOUNTER — Encounter: Payer: Medicare Other | Admitting: Internal Medicine

## 2016-06-20 ENCOUNTER — Other Ambulatory Visit: Payer: Self-pay

## 2016-07-08 ENCOUNTER — Ambulatory Visit (INDEPENDENT_AMBULATORY_CARE_PROVIDER_SITE_OTHER): Payer: Medicare Other | Admitting: Family Medicine

## 2016-07-08 VITALS — BP 136/62 | HR 67 | Temp 97.9°F

## 2016-07-08 DIAGNOSIS — H6123 Impacted cerumen, bilateral: Secondary | ICD-10-CM

## 2016-07-08 DIAGNOSIS — Z23 Encounter for immunization: Secondary | ICD-10-CM | POA: Diagnosis not present

## 2016-07-08 NOTE — Progress Notes (Signed)
Pt here for ear irrigation. Bilateral irrigation done, pt tolerated well. Pt also given flu immunization with no complications.Audelia Hives Gutierrez

## 2016-11-02 ENCOUNTER — Encounter: Payer: Self-pay | Admitting: Osteopathic Medicine

## 2016-11-02 ENCOUNTER — Ambulatory Visit (INDEPENDENT_AMBULATORY_CARE_PROVIDER_SITE_OTHER): Payer: Medicare Other | Admitting: Osteopathic Medicine

## 2016-11-02 ENCOUNTER — Ambulatory Visit (INDEPENDENT_AMBULATORY_CARE_PROVIDER_SITE_OTHER): Payer: Medicare Other

## 2016-11-02 VITALS — BP 135/80 | HR 68 | Ht 69.0 in | Wt 125.0 lb

## 2016-11-02 DIAGNOSIS — I10 Essential (primary) hypertension: Secondary | ICD-10-CM | POA: Diagnosis not present

## 2016-11-02 DIAGNOSIS — K635 Polyp of colon: Secondary | ICD-10-CM

## 2016-11-02 DIAGNOSIS — F17200 Nicotine dependence, unspecified, uncomplicated: Secondary | ICD-10-CM | POA: Diagnosis not present

## 2016-11-02 DIAGNOSIS — J449 Chronic obstructive pulmonary disease, unspecified: Secondary | ICD-10-CM

## 2016-11-02 DIAGNOSIS — R972 Elevated prostate specific antigen [PSA]: Secondary | ICD-10-CM

## 2016-11-02 DIAGNOSIS — E7849 Other hyperlipidemia: Secondary | ICD-10-CM

## 2016-11-02 DIAGNOSIS — Z125 Encounter for screening for malignant neoplasm of prostate: Secondary | ICD-10-CM

## 2016-11-02 DIAGNOSIS — E784 Other hyperlipidemia: Secondary | ICD-10-CM

## 2016-11-02 DIAGNOSIS — J439 Emphysema, unspecified: Secondary | ICD-10-CM

## 2016-11-02 DIAGNOSIS — I700 Atherosclerosis of aorta: Secondary | ICD-10-CM

## 2016-11-02 DIAGNOSIS — R634 Abnormal weight loss: Secondary | ICD-10-CM

## 2016-11-02 LAB — COMPLETE METABOLIC PANEL WITH GFR
ALK PHOS: 59 U/L (ref 40–115)
ALT: 24 U/L (ref 9–46)
AST: 30 U/L (ref 10–35)
Albumin: 4.3 g/dL (ref 3.6–5.1)
BILIRUBIN TOTAL: 0.8 mg/dL (ref 0.2–1.2)
BUN: 26 mg/dL — AB (ref 7–25)
CO2: 23 mmol/L (ref 20–31)
Calcium: 10 mg/dL (ref 8.6–10.3)
Chloride: 101 mmol/L (ref 98–110)
Creat: 1.2 mg/dL — ABNORMAL HIGH (ref 0.70–1.11)
GFR, Est African American: 66 mL/min (ref >=60)
GFR, Est Non African American: 57 mL/min — ABNORMAL LOW (ref >=60)
GLUCOSE: 101 mg/dL — AB (ref 65–99)
Potassium: 4.5 mmol/L (ref 3.5–5.3)
Sodium: 141 mmol/L (ref 135–146)
TOTAL PROTEIN: 7.5 g/dL (ref 6.1–8.1)

## 2016-11-02 LAB — CBC WITH DIFFERENTIAL/PLATELET
Basophils Absolute: 0 cells/uL (ref 0–200)
Basophils Relative: 0 %
EOS PCT: 3 %
Eosinophils Absolute: 177 cells/uL (ref 15–500)
HCT: 42.1 % (ref 38.5–50.0)
HEMOGLOBIN: 14 g/dL (ref 13.2–17.1)
LYMPHS ABS: 2360 {cells}/uL (ref 850–3900)
Lymphocytes Relative: 40 %
MCH: 29.8 pg (ref 27.0–33.0)
MCHC: 33.3 g/dL (ref 32.0–36.0)
MCV: 89.6 fL (ref 80.0–100.0)
MPV: 10.1 fL (ref 7.5–12.5)
Monocytes Absolute: 472 cells/uL (ref 200–950)
Monocytes Relative: 8 %
NEUTROS ABS: 2891 {cells}/uL (ref 1500–7800)
Neutrophils Relative %: 49 %
Platelets: 236 10*3/uL (ref 140–400)
RBC: 4.7 MIL/uL (ref 4.20–5.80)
RDW: 14 % (ref 11.0–15.0)
WBC: 5.9 10*3/uL (ref 3.8–10.8)

## 2016-11-02 LAB — TSH: TSH: 1.53 mIU/L (ref 0.40–4.50)

## 2016-11-02 LAB — LIPID PANEL
Cholesterol: 190 mg/dL (ref <200)
HDL: 101 mg/dL (ref >40)
LDL CALC: 75 mg/dL (ref <100)
Total CHOL/HDL Ratio: 1.9 Ratio (ref <5.0)
Triglycerides: 69 mg/dL (ref <150)
VLDL: 14 mg/dL (ref <30)

## 2016-11-02 LAB — PSA: PSA: 5 ng/mL — AB (ref <=4.0)

## 2016-11-02 MED ORDER — ATORVASTATIN CALCIUM 10 MG PO TABS: ORAL_TABLET | ORAL | 3 refills | Status: DC

## 2016-11-02 NOTE — Progress Notes (Signed)
HPI: Timothy Waller is a 83 y.o. male  who presents to [facility] today, 11/02/16,  for chief complaint of:  Chief Complaint  Patient presents with  . Other    Switch from [place]     Hyperlipidemia: Patient requests refill of Lipitor. Doing well on this medication.  Patient states that he has had some trouble gaining weight but has not noticed a dramatic loss of weight, that he is down 10 pounds from about this time 2 years ago. History of smoking. No significant weakness/fatigue. No shortness of breath or chest pain. No dark/tarry stools or bloody stools. Patient requests PSA testing.  Hypertension: History of essential hypertension noted on problem list, blood pressure is normal today. No chest pain, pressure, shortness of breath.   Past medical, surgical, social and family history reviewed: Patient Active Problem List   Diagnosis Date Noted  . Needs flu shot 07/08/2016  . Osteopenia 02/17/2016  . Essential hypertension 02/09/2016  . Special screening for malignant neoplasms, colon 03/05/2015  . Hyperlipidemia 12/16/2014  . COPD with emphysema ([place]) 12/15/2014  . SHOULDER PAIN 08/24/2010  . HEMATURIA UNSPECIFIED 06/11/2009  . ERECTILE DYSFUNCTION, ORGANIC 06/11/2009  . SEBORRHEIC KERATOSIS 06/11/2009  . LUMBAGO 06/11/2009   Past Surgical History:  Procedure Laterality Date  . COLONOSCOPY    . INNER EAR SURGERY     hole in eardrum 30 years ago   Social History  Substance Use Topics  . Smoking status: Current Every Day Smoker    Packs/day: 0.50    Years: 20.00    Types: Cigarettes  . Smokeless tobacco: Never Used  . Alcohol use No   Family History  Problem Relation Age of Onset  . Cancer Father     mouth cancer      Current medication list and allergy/intolerance information reviewed:   Current Outpatient Prescriptions on File Prior to Visit  Medication Sig Dispense Refill  . Ascorbic Acid (VITAMIN C) 1000 MG tablet Take  1,000 mg by mouth daily.    Marland Kitchen atorvastatin (LIPITOR) 10 MG tablet TAKE 1 TABLET (10 MG TOTAL) BY MOUTH DAILY. 90 tablet 3  . COD LIVER OIL PO Take by mouth.    . Multiple Vitamins-Minerals (CENTRUM PO) Take by mouth.    . Omega-3 Fatty Acids (FISH OIL) 1000 MG CAPS Take by mouth.     No current facility-administered medications on file prior to visit.    No Known Allergies    Review of Systems:  Constitutional: No recent illness, + unintentional weight change  HEENT: No  headache, no vision change  Cardiac: No  chest pain, No  pressure, No palpitations  Respiratory:  No  shortness of breath. No  Cough  Gastrointestinal: No  abdominal pain, no change on bowel habits  Musculoskeletal: No new myalgia/arthralgia  Skin: No  Rash  Hem/Onc: No  easy bruising/bleeding, No  abnormal lumps/bumps  Neurologic: No  weakness, No  Dizziness  Psychiatric: No  concerns with depression, No  concerns with anxiety  Exam:  BP 135/80   Pulse 68   Ht 5\' 9"  (1.753 m)   Wt 125 lb (56.7 kg)   BMI 18.46 kg/m   Constitutional: VS see above. General Appearance: alert, well-developed, well-nourished, NAD  Eyes: Normal lids and conjunctive, non-icteric sclera  Ears, Nose, Mouth, Throat: MMM, Normal external inspection ears/nares/mouth/lips/gums.  Neck: No masses, trachea midline.   Respiratory: Normal respiratory effort. no wheeze, no rhonchi, no rales  Cardiovascular: S1/S2 normal, no  murmur, no rub/gallop auscultated. RRR.   GI: Nontender, nondistended. No masses. Rectal exam deferred. Bowel sounds normal 4  Musculoskeletal: Gait normal. Symmetric and independent movement of all extremities  Neurological: Normal balance/coordination. No tremor.  Skin: warm, dry, intact.   Psychiatric: Normal judgment/insight. Normal mood and affect. Oriented x3.     Records reviewed: Positive cold guard in 2017, subsequent colonoscopy revealed benign polyp disease.   Chest x-ray personally  reviewed: Findings consistent with COPD, no obvious large mass, questionable small lymph nodes visible and left lower lobe scar tissue versus indistinct mass, await radiology over read    ASSESSMENT/PLAN:   Pulmonary emphysema, unspecified emphysema type ([place]) - Plan: DG Chest 2 View  Loss of weight - Plan: CBC with Differential/Platelet, COMPLETE METABOLIC PANEL WITH GFR, TSH, Lipid panel, DG Chest 2 View, PSA, CANCELED: PSA  Essential hypertension - Plan: CBC with Differential/Platelet, COMPLETE METABOLIC PANEL WITH GFR, TSH, Lipid panel  Prostate cancer screening - Plan: PSA, CANCELED: PSA, CANCELED: PSA  Other hyperlipidemia  Benign colonic polyp     Visit summary with medication list and pertinent instructions was printed for patient to review. All questions at time of visit were answered - patient instructed to contact office with any additional concerns. ER/RTC precautions were reviewed with the patient. Follow-up plan: Return for medicare annual [place] visit something in the next year, see Dr Sheppard Coil annually .

## 2016-11-04 ENCOUNTER — Other Ambulatory Visit: Payer: Self-pay

## 2016-11-04 DIAGNOSIS — R972 Elevated prostate specific antigen [PSA]: Secondary | ICD-10-CM

## 2016-11-04 NOTE — Addendum Note (Signed)
Addended by: Maryla Morrow on: 11/04/2016 02:13 PM   Modules accepted: Orders

## 2016-12-13 DIAGNOSIS — R972 Elevated prostate specific antigen [PSA]: Secondary | ICD-10-CM | POA: Diagnosis not present

## 2016-12-14 LAB — PSA: PSA: 3 ng/mL (ref <=4.0)

## 2017-04-28 DIAGNOSIS — H5213 Myopia, bilateral: Secondary | ICD-10-CM | POA: Diagnosis not present

## 2017-04-28 DIAGNOSIS — H25813 Combined forms of age-related cataract, bilateral: Secondary | ICD-10-CM | POA: Diagnosis not present

## 2017-04-28 DIAGNOSIS — H527 Unspecified disorder of refraction: Secondary | ICD-10-CM | POA: Diagnosis not present

## 2017-04-28 DIAGNOSIS — H524 Presbyopia: Secondary | ICD-10-CM | POA: Diagnosis not present

## 2017-05-15 DIAGNOSIS — S51801A Unspecified open wound of right forearm, initial encounter: Secondary | ICD-10-CM | POA: Diagnosis not present

## 2017-05-15 DIAGNOSIS — S50311A Abrasion of right elbow, initial encounter: Secondary | ICD-10-CM | POA: Diagnosis not present

## 2017-05-15 DIAGNOSIS — L089 Local infection of the skin and subcutaneous tissue, unspecified: Secondary | ICD-10-CM | POA: Diagnosis not present

## 2017-06-12 ENCOUNTER — Ambulatory Visit (INDEPENDENT_AMBULATORY_CARE_PROVIDER_SITE_OTHER): Payer: Medicare Other

## 2017-06-12 ENCOUNTER — Ambulatory Visit (INDEPENDENT_AMBULATORY_CARE_PROVIDER_SITE_OTHER): Payer: Medicare Other | Admitting: Physician Assistant

## 2017-06-12 ENCOUNTER — Encounter: Payer: Self-pay | Admitting: Physician Assistant

## 2017-06-12 VITALS — BP 156/71 | HR 66 | Temp 97.9°F | Wt 127.0 lb

## 2017-06-12 DIAGNOSIS — J441 Chronic obstructive pulmonary disease with (acute) exacerbation: Secondary | ICD-10-CM

## 2017-06-12 DIAGNOSIS — R062 Wheezing: Secondary | ICD-10-CM

## 2017-06-12 DIAGNOSIS — F17200 Nicotine dependence, unspecified, uncomplicated: Secondary | ICD-10-CM

## 2017-06-12 DIAGNOSIS — Z23 Encounter for immunization: Secondary | ICD-10-CM | POA: Diagnosis not present

## 2017-06-12 DIAGNOSIS — J449 Chronic obstructive pulmonary disease, unspecified: Secondary | ICD-10-CM | POA: Diagnosis not present

## 2017-06-12 DIAGNOSIS — I951 Orthostatic hypotension: Secondary | ICD-10-CM

## 2017-06-12 MED ORDER — PREDNISONE 50 MG PO TABS: ORAL_TABLET | ORAL | 0 refills | Status: DC

## 2017-06-12 MED ORDER — AZITHROMYCIN 250 MG PO TABS: ORAL_TABLET | ORAL | 0 refills | Status: DC

## 2017-06-12 NOTE — Patient Instructions (Addendum)
-   Chest x-ray today - Antibiotic and steroids for 5 days as prescribed - Follow-up in 1 week with PCP   Chronic Obstructive Pulmonary Disease Exacerbation Chronic obstructive pulmonary disease (COPD) is a common lung condition in which airflow from the lungs is limited. COPD is a general term that can be used to describe many different lung problems that limit airflow, including chronic bronchitis and emphysema. COPD exacerbations are episodes when breathing symptoms become much worse and require extra treatment. Without treatment, COPD exacerbations can be life threatening, and frequent COPD exacerbations can cause further damage to your lungs. What are the causes?  Respiratory infections.  Exposure to smoke.  Exposure to air pollution, chemical fumes, or dust. Sometimes there is no apparent cause or trigger. What increases the risk?  Smoking cigarettes.  Older age.  Frequent prior COPD exacerbations. What are the signs or symptoms?  Increased coughing.  Increased thick spit (sputum) production.  Increased wheezing.  Increased shortness of breath.  Rapid breathing.  Chest tightness. How is this diagnosed? Your medical history, a physical exam, and tests will help your health care provider make a diagnosis. Tests may include:  A chest X-ray.  Basic lab tests.  Sputum testing.  An arterial blood gas test.  How is this treated? Depending on the severity of your COPD exacerbation, you may need to be admitted to a hospital for treatment. Some of the treatments commonly used to treat COPD exacerbations are:  Antibiotic medicines.  Bronchodilators. These are drugs that expand the air passages. They may be given with an inhaler or nebulizer. Spacer devices may be needed to help improve drug delivery.  Corticosteroid medicines.  Supplemental oxygen therapy.  Airway clearing techniques, such as noninvasive ventilation (NIV) and positive expiratory pressure (PEP). These  provide respiratory support through a mask or other noninvasive device.  Follow these instructions at home:  Do not smoke. Quitting smoking is very important to prevent COPD from getting worse and exacerbations from happening as often.  Avoid exposure to all substances that irritate the airway, especially to tobacco smoke.  If you were prescribed an antibiotic medicine, finish it all even if you start to feel better.  Take all medicines as directed by your health care provider.It is important to use correct technique with inhaled medicines.  Drink enough fluids to keep your urine clear or pale yellow (unless you have a medical condition that requires fluid restriction).  Use a cool mist vaporizer. This makes it easier to clear your chest when you cough.  If you have a home nebulizer and oxygen, continue to use them as directed.  Maintain all necessary vaccinations to prevent infections.  Exercise regularly.  Eat a healthy diet.  Keep all follow-up appointments as directed by your health care provider. Get help right away if:  You have worsening shortness of breath.  You have trouble talking.  You have severe chest pain.  You have blood in your sputum.  You have a fever.  You have weakness, vomit repeatedly, or faint.  You feel confused.  You continue to get worse. This information is not intended to replace advice given to you by your health care provider. Make sure you discuss any questions you have with your health care provider. Document Released: 08/07/2007 Document Revised: 03/17/2016 Document Reviewed: 06/14/2013 Elsevier Interactive Patient Education  2017 [employer].

## 2017-06-12 NOTE — Progress Notes (Signed)
HPI:                                                                Timothy Waller is a 83 y.o. male who presents to [facility]: [facility] today for lightheadedness  Patient with PMH of HTN, HLD, COPD reports intermittent lightheadedness x 2 weeks. Worse with position change.  Denies falls or LOC. Denies palpitations or chest pain with exertion. Denies visual disturbance. He does endorse chronic nonproductive cough that is worsening from baseline. Denies fever, chills, malaise. Continues to smoke 1/2 ppd.   Past Medical History:  Diagnosis Date  . Abnormal weight loss   . COPD (chronic obstructive pulmonary disease) ([place])   . Hyperlipidemia   . Osteoporosis 09/01/2010   Qualifier: Diagnosis of  By: Renae Fickle CMA, (AAMA), Seth Bake     Past Surgical History:  Procedure Laterality Date  . COLONOSCOPY    . INNER EAR SURGERY     hole in eardrum 30 years ago   Social History  Substance Use Topics  . Smoking status: Current Every Day Smoker    Packs/day: 0.50    Years: 20.00    Types: Cigarettes  . Smokeless tobacco: Never Used  . Alcohol use No   family history includes Cancer in his father.  ROS: negative except as noted in the HPI  Medications: Current Outpatient Prescriptions  Medication Sig Dispense Refill  . Ascorbic Acid (VITAMIN C) 1000 MG tablet Take 1,000 mg by mouth daily.    Marland Kitchen atorvastatin (LIPITOR) 10 MG tablet TAKE 1 TABLET (10 MG TOTAL) BY MOUTH DAILY. 90 tablet 3  . COD LIVER OIL PO Take by mouth.    . Multiple Vitamins-Minerals (CENTRUM PO) Take by mouth.    . Omega-3 Fatty Acids (FISH OIL) 1000 MG CAPS Take by mouth.     No current facility-administered medications for this visit.    No Known Allergies   Objective:  BP (!) 156/71   Pulse 66   Temp 97.9 F (36.6 C)   Wt 127 lb (57.6 kg)   SpO2 94%   BMI 18.75 kg/m  Gen:  alert, not ill-appearing, no distress, appropriate for age 11: head normocephalic without  obvious abnormality, conjunctiva and cornea clear, wearing glasses, trachea midline CV: Normal rate, regular rhythm, s1 and s2 distinct, no murmurs, clicks or rubs  Pulm: Normal work of breathing, normal phonation, diffuse inspiratory and expiratory wheezes Neuro: alert and oriented x 3, no tremor MSK: extremities atraumatic, normal gait and station Skin: intact, no rashes on exposed skin, no cyanosis    Orthostatic VS for the past 24 hrs:  BP- Lying Pulse- Lying BP- Sitting Pulse- Sitting BP- Standing at 0 minutes Pulse- Standing at 0 minutes  06/12/17 1056 134/69 71 151/68 69 120/69 73      No results found for this or any previous visit (from the past 72 hour(s)). No results found.  CLINICAL DATA:  COPD, current smoker. No complaints except difficulty gaining weight.  EXAM: CHEST  2 VIEW  COMPARISON:  None in PACs  FINDINGS: The lungs remain hyperinflated with hemidiaphragm flattening. There is no focal infiltrate. There is no pleural effusion. The interstitial markings are coarse bilaterally. The heart and pulmonary vascularity are normal. The mediastinum  is normal in width. There is calcification in the wall of the aortic arch. The bony thorax exhibits no acute abnormality.  IMPRESSION: COPD. No evidence of pneumonia nor other acute cardiopulmonary abnormality.  Thoracic aortic atherosclerosis.   Electronically Signed   By: David  Martinique M.D.   On: 11/02/2016 13:06  Assessment and Plan: 83 y.o. male with   1. Orthostasis - drop in SBP of 31 points from sitting to standing with symptoms of lightheadedness  2. Wheezes   3. COPD with acute exacerbation (H[facility]) - SpO2 94% at rest on room air. No signs of respiratory distress. Diffuse wheezes on exam. Chest x-ray ordered. - influenza high-dose given today - due for pneumonia vaccination - DG Chest 2 View - reviewed prior CXR from 11/02/16 which showed hyperinflation consistent with COPD/emphysema -  recommend close follow-up with PCP. Patient could benefit from LABA and pulmonary rehab - azithromycin (ZITHROMAX Z-PAK) 250 MG tablet; Take 2 tablets (500 mg) on  Day 1,  followed by 1 tablet (250 mg) once daily on Days 2 through 5.  Dispense: 6 tablet; Refill: 0 - predniSONE (DELTASONE) 50 MG tablet; One tab PO daily for 5 days.  Dispense: 5 tablet; Refill: 0  4. Heavy tobacco smoker - no longer a candidate for low-dose lung ca screen due to age >3 - not open to smoking cessation  Patient education and anticipatory guidance given Patient agrees with treatment plan Follow-up in 1 week or sooner as needed if symptoms worsen or fail to improve  Darlyne Russian PA-C

## 2017-06-13 ENCOUNTER — Encounter: Payer: Self-pay | Admitting: Osteopathic Medicine

## 2017-06-16 NOTE — Progress Notes (Signed)
Good morning Giankarlo,  Your chest x-ray showed COPD, but no evidence of pneumonia. Continue with the current treatment plan and follow-up with Dr. Sheppard Coil next week.  Best, Evlyn Clines

## 2017-06-20 ENCOUNTER — Ambulatory Visit (INDEPENDENT_AMBULATORY_CARE_PROVIDER_SITE_OTHER): Payer: Medicare Other | Admitting: Osteopathic Medicine

## 2017-06-20 ENCOUNTER — Encounter: Payer: Self-pay | Admitting: Osteopathic Medicine

## 2017-06-20 VITALS — BP 139/62 | HR 57 | Wt 126.0 lb

## 2017-06-20 DIAGNOSIS — J439 Emphysema, unspecified: Secondary | ICD-10-CM

## 2017-06-20 NOTE — Progress Notes (Signed)
HPI: Windle Huebert Gersten is a 83 y.o. male  who presents to [facility] today, 06/20/17,  for chief complaint of:  Chief Complaint  Patient presents with  . Follow-up     . Was seen last week for COPD exacerbation - was seen and treated by a colleague while I was out of town. He is feeling much improved today, dizziness and SOB have resolved. Pt is able to do all ADl as desired, does not want to be on inhalers or O2 unless absolutely needed    Immunization History  Administered Date(s) Administered  . Influenza, High Dose Seasonal PF 06/12/2017  . Influenza,inj,Quad PF,6+ Mos 07/08/2016  . Influenza-Unspecified 07/24/2014, 07/22/2015  . Pneumococcal Polysaccharide-23 10/24/2005  . Pneumococcal-Unspecified 08/24/2014  . Td 10/24/2005  . Zoster 02/07/2014     Patient is accompanied by daughter who assists with history-taking.   Past medical history, surgical history, social history and family history reviewed.  Patient Active Problem List   Diagnosis Date Noted  . Orthostasis 06/12/2017  . COPD with acute exacerbation ([place]) 06/12/2017  . Heavy tobacco smoker 06/12/2017  . Benign colonic polyp 11/02/2016  . Needs flu shot 07/08/2016  . Osteopenia 02/17/2016  . Essential hypertension 02/09/2016  . Special screening for malignant neoplasms, colon 03/05/2015  . Hyperlipidemia 12/16/2014  . COPD with emphysema ([place]) 12/15/2014  . SHOULDER PAIN 08/24/2010  . HEMATURIA UNSPECIFIED 06/11/2009  . ERECTILE DYSFUNCTION, ORGANIC 06/11/2009  . SEBORRHEIC KERATOSIS 06/11/2009  . LUMBAGO 06/11/2009    Current medication list and allergy/intolerance information reviewed.   Current Outpatient Prescriptions on File Prior to Visit  Medication Sig Dispense Refill  . Ascorbic Acid (VITAMIN C) 1000 MG tablet Take 1,000 mg by mouth daily.    Marland Kitchen atorvastatin (LIPITOR) 10 MG tablet TAKE 1 TABLET (10 MG TOTAL) BY MOUTH DAILY. 90 tablet 3  . COD LIVER OIL PO Take  by mouth.    . Multiple Vitamins-Minerals (CENTRUM PO) Take by mouth.    . Omega-3 Fatty Acids (FISH OIL) 1000 MG CAPS Take by mouth.     No current facility-administered medications on file prior to visit.    No Known Allergies    Review of Systems:  Constitutional: +recent illness but has recovered to his satisfaction  HEENT: No  headache, no vision change  Cardiac: No  chest pain, No  pressure, No palpitations  Respiratory:  No  shortness of breath. No  Cough  Neurologic: No  weakness, No  Dizziness   Exam:  BP 139/62   Pulse (!) 57   Wt 126 lb (57.2 kg)   BMI 18.61 kg/m   Constitutional: VS see above. General Appearance: alert, well-developed, well-nourished, NAD  Eyes: Normal lids and conjunctive, non-icteric sclera  Ears, Nose, Mouth, Throat: MMM, Normal external inspection ears/nares/mouth/lips/gums.  Neck: No masses, trachea midline.   Respiratory: Normal respiratory effort. no wheeze, no rhonchi, no rales. Breath sounds diminished bilaterally  Cardiovascular: S1/S2 normal, no murmur, no rub/gallop auscultated. RRR.   Musculoskeletal: Gait normal. Symmetric and independent movement of all extremities  Neurological: Normal balance/coordination. No tremor.  Skin: warm, dry, intact.   Psychiatric: Normal judgment/insight. Normal mood and affect. Oriented x3.   CXR personally reviewed - significant COPD changes.     ASSESSMENT/PLAN:   Pulmonary emphysema, unspecified emphysema type ([place]) - pt desires no meds at this point, f/u PFT or other w/u or tx as needed.       Follow-up plan: Return for [place]  10/2017, sooner if needed .  Visit summary with medication list and pertinent instructions was printed for patient to review, alert [place] if any changes needed. All questions at time of visit were answered - patient instructed to contact office with any additional concerns. ER/RTC precautions were reviewed with the patient and understanding  verbalized.

## 2017-11-06 ENCOUNTER — Encounter: Payer: Self-pay | Admitting: Osteopathic Medicine

## 2017-11-06 ENCOUNTER — Ambulatory Visit (INDEPENDENT_AMBULATORY_CARE_PROVIDER_SITE_OTHER): Payer: Medicare Other | Admitting: Osteopathic Medicine

## 2017-11-06 VITALS — BP 128/57 | HR 61 | Temp 97.7°F | Ht 69.0 in | Wt 123.1 lb

## 2017-11-06 DIAGNOSIS — E785 Hyperlipidemia, unspecified: Secondary | ICD-10-CM | POA: Diagnosis not present

## 2017-11-06 DIAGNOSIS — Z0000 Encounter for general adult medical examination without abnormal findings: Secondary | ICD-10-CM | POA: Diagnosis not present

## 2017-11-06 DIAGNOSIS — J439 Emphysema, unspecified: Secondary | ICD-10-CM

## 2017-11-06 DIAGNOSIS — Z87898 Personal history of other specified conditions: Secondary | ICD-10-CM

## 2017-11-06 MED ORDER — ATORVASTATIN CALCIUM 10 MG PO TABS: ORAL_TABLET | ORAL | 3 refills | Status: DC

## 2017-11-06 NOTE — Progress Notes (Signed)
Subjective:   Timothy Waller is a 84 y.o. male who presents for Medicare Annual/Subsequent preventive examination.  Review of Systems: 10 point review of systems was conducted today, no concerns. Patient is feeling well.       Objective:    Vitals: BP (!) 128/57   Pulse 61   Temp 97.7 F (36.5 C) (Oral)   Ht 5\' 9"  (1.753 m)   Wt 123 lb 1.6 oz (55.8 kg)   BMI 18.18 kg/m   Body mass index is 18.18 kg/m.  Advanced Directives 05/03/2016  Does Patient Have a Medical Advance Directive? Yes  Copy of [place] in Chart? No - copy requested    Tobacco Social History   Tobacco Use  Smoking Status Current Every Day Smoker  . Packs/day: 0.25  . Types: Cigarettes  Smokeless Tobacco Never Used     Ready to quit: No Counseling given: Yes   Clinical Intake: Intake questionnaire was negative for depression screening, fall risk, concerns with activities of daily living, concerns with memory problems. Patient sees no other medical specialists on a routine basis. If instructed discussed as below   Past Medical History:  Diagnosis Date  . Abnormal weight loss   . COPD (chronic obstructive pulmonary disease) ([place])   . Hyperlipidemia   . Osteoporosis 09/01/2010   Qualifier: Diagnosis of  By: Renae Fickle CMA, (AAMA), Seth Bake     Past Surgical History:  Procedure Laterality Date  . COLONOSCOPY    . INNER EAR SURGERY     hole in eardrum 30 years ago   Family History  Problem Relation Age of Onset  . Cancer Father        mouth cancer    Social History   Socioeconomic History  . Marital status: Married    Spouse name: None  . Number of children: None  . Years of education: None  . Highest education level: None  Social Needs  . Financial resource strain: None  . Food insecurity - worry: None  . Food insecurity - inability: None  . Transportation needs - medical: None  . Transportation needs - non-medical: None  Occupational History  . None  Tobacco Use   . Smoking status: Current Every Day Smoker    Packs/day: 0.25    Types: Cigarettes  . Smokeless tobacco: Never Used  Substance and Sexual Activity  . Alcohol use: Yes    Alcohol/week: 0.0 oz    Comment: 2-3 drinks per week   . Drug use: No  . Sexual activity: Not Currently  Other Topics Concern  . None  Social History Narrative  . None    Outpatient Encounter Medications as of 11/06/2017  Medication Sig  . Ascorbic Acid (VITAMIN C) 1000 MG tablet Take 1,000 mg by mouth daily.  Marland Kitchen atorvastatin (LIPITOR) 10 MG tablet TAKE 1 TABLET (10 MG TOTAL) BY MOUTH DAILY.  . COD LIVER OIL PO Take by mouth.  . Multiple Vitamins-Minerals (CENTRUM PO) Take by mouth.  . Omega-3 Fatty Acids (FISH OIL) 1000 MG CAPS Take by mouth.  . [DISCONTINUED] atorvastatin (LIPITOR) 10 MG tablet TAKE 1 TABLET (10 MG TOTAL) BY MOUTH DAILY.   No facility-administered encounter medications on file as of 11/06/2017.     Activities of Daily Living No flowsheet data found.  Patient Care Team: Emeterio Reeve, DO as PCP - General (Osteopathic Medicine)   Assessment:   This is a routine wellness examination for Timothy Waller.  Exercise Activities and Dietary recommendations  Goals    None      Fall Risk Fall Risk  06/20/2017 06/20/2016  Falls in the past year? No No  Comment - Emmi Telephone Survey: data to providers prior to load   Is the patient's home free of loose throw rugs in walkways, pet beds, electrical cords, etc?   yes      Grab bars in the bathroom? no      Handrails on the stairs?   yes      Adequate lighting?   yes  Timed Get Up and Go Performed: normal  Depression Screen PHQ 2/9 Scores 06/20/2017  PHQ - 2 Score 0    Cognitive Function: MMSE normal        Immunization History  Administered Date(s) Administered  . Influenza, High Dose Seasonal PF 06/12/2017  . Influenza,inj,Quad PF,6+ Mos 07/08/2016  . Influenza-Unspecified 07/24/2014, 07/22/2015  . Pneumococcal  Polysaccharide-23 10/24/2005  . Pneumococcal-Unspecified 08/24/2014  . Td 10/24/2005  . Zoster 02/07/2014    Qualifies for Shingles Vaccine? Yes - updated vaccine was declined by the patient  Screening Tests Health Maintenance  Topic Date Due  . PNA vac Low Risk Adult (2 of 2 - PCV13) 08/25/2015  . TETANUS/TDAP  10/25/2015  . INFLUENZA VACCINE  Completed   Cancer Screenings: Lung: Low Dose CT Chest recommended if Age 27-80 years, 30 pack-year currently smoking OR have quit w/in 15years. Patient does not qualify. Colorectal: Does not qualify  Additional Screenings:  Hepatitis B/HIV/Syphillis: no need Hepatitis C Screening: no need    Plan:    Medicare annual wellness visit, subsequent  Hyperlipidemia, unspecified hyperlipidemia type - Plan: CBC, COMPLETE METABOLIC PANEL WITH GFR, Lipid panel  History of elevated PSA - Plan: CANCELED: PSA, Total with Reflex to PSA, Free  Pulmonary emphysema, unspecified emphysema type ([place])    I have personally reviewed and noted the following in the patient's chart:   . Medical and social history . Use of alcohol, tobacco or illicit drugs  . Current medications and supplements . Functional ability and status . Nutritional status . Physical activity . Advanced directives . List of other physicians . Hospitalizations, surgeries, and ER visits in previous 12 months . Vitals . Screenings to include cognitive, depression, and falls . Referrals and appointments  In addition, I have reviewed and discussed with patient certain preventive protocols, quality metrics, and best practice recommendations. A written personalized care plan for preventive services as well as general preventive health recommendations were provided to patient.     Emeterio Reeve, DO  11/06/2017

## 2017-11-07 ENCOUNTER — Encounter: Payer: Self-pay | Admitting: Osteopathic Medicine

## 2017-11-08 DIAGNOSIS — Z87898 Personal history of other specified conditions: Secondary | ICD-10-CM | POA: Diagnosis not present

## 2017-11-08 DIAGNOSIS — E785 Hyperlipidemia, unspecified: Secondary | ICD-10-CM | POA: Diagnosis not present

## 2017-11-08 LAB — CBC
HCT: 38.9 % (ref 38.5–50.0)
Hemoglobin: 13.1 g/dL — ABNORMAL LOW (ref 13.2–17.1)
MCH: 29.5 pg (ref 27.0–33.0)
MCHC: 33.7 g/dL (ref 32.0–36.0)
MCV: 87.6 fL (ref 80.0–100.0)
MPV: 10.8 fL (ref 7.5–12.5)
PLATELETS: 194 10*3/uL (ref 140–400)
RBC: 4.44 10*6/uL (ref 4.20–5.80)
RDW: 12.6 % (ref 11.0–15.0)
WBC: 5.3 10*3/uL (ref 3.8–10.8)

## 2017-11-08 LAB — LIPID PANEL
CHOLESTEROL: 162 mg/dL (ref <200)
HDL: 88 mg/dL (ref >40)
LDL Cholesterol (Calc): 60 mg/dL (calc)
Non-HDL Cholesterol (Calc): 74 mg/dL (calc) (ref <130)
TRIGLYCERIDES: 59 mg/dL (ref <150)
Total CHOL/HDL Ratio: 1.8 (calc) (ref <5.0)

## 2017-11-08 LAB — COMPLETE METABOLIC PANEL WITH GFR
AG RATIO: 1.6 (calc) (ref 1.0–2.5)
ALKALINE PHOSPHATASE (APISO): 48 U/L (ref 40–115)
ALT: 18 U/L (ref 9–46)
AST: 26 U/L (ref 10–35)
Albumin: 4 g/dL (ref 3.6–5.1)
BUN: 24 mg/dL (ref 7–25)
CALCIUM: 9.2 mg/dL (ref 8.6–10.3)
CO2: 28 mmol/L (ref 20–32)
Chloride: 105 mmol/L (ref 98–110)
Creat: 1.11 mg/dL (ref 0.70–1.11)
GFR, Est African American: 72 mL/min/{1.73_m2} (ref >=60)
GFR, Est Non African American: 62 mL/min/{1.73_m2} (ref >=60)
Globulin: 2.5 g/dL (calc) (ref 1.9–3.7)
Glucose, Bld: 92 mg/dL (ref 65–99)
Potassium: 4.6 mmol/L (ref 3.5–5.3)
Sodium: 140 mmol/L (ref 135–146)
Total Bilirubin: 0.9 mg/dL (ref 0.2–1.2)
Total Protein: 6.5 g/dL (ref 6.1–8.1)

## 2017-11-16 ENCOUNTER — Emergency Department (INDEPENDENT_AMBULATORY_CARE_PROVIDER_SITE_OTHER)
Admission: EM | Admit: 2017-11-16 | Discharge: 2017-11-16 | Disposition: A | Discharge status: Home / Self Care | Payer: Medicare Other | Source: Home / Self Care

## 2017-11-16 ENCOUNTER — Other Ambulatory Visit: Payer: Self-pay

## 2017-11-16 ENCOUNTER — Encounter: Payer: Self-pay | Admitting: Emergency Medicine

## 2017-11-16 DIAGNOSIS — H6123 Impacted cerumen, bilateral: Secondary | ICD-10-CM | POA: Diagnosis not present

## 2017-11-16 NOTE — Discharge Instructions (Signed)
I recommend that you put it on your calendar as a reminder every 3 or 4 months to use some over-the-counter Debrox in your ears and flush them out with the little support bulb that comes with the Debrox kit.  If you stay ahead of the wax like this, but you can avoid having to periodically come in and get your ears irrigated.  In the event of ear pain or hearing loss or inability to get the wax out on your own get checked .

## 2017-11-16 NOTE — ED Triage Notes (Signed)
Patient states has need of ear flushing periodically and feels fullness in both ears over the past 2 days. Denies pain.

## 2017-11-16 NOTE — ED Provider Notes (Addendum)
[facility]    CSN: [id number] Arrival date & time: 11/16/17  1707     History   Chief Complaint Chief Complaint  Patient presents with  . Ear Fullness    right and left    HPI Timothy Waller is a 84 y.o. male.   HPI Patient is here asking for wax to be washed out of his ears.  When he was in the [employer] many years ago he had a perforation of the left TM that they put paper on and it healed over.  He has had his ears washed out many times since then.  Recurrent cerumen impaction is a chronic problem for him.  He says as far as he knows that there is no persisting hole in the eardrum.  He says this time the right is worse than the left. Past Medical History:  Diagnosis Date  . Abnormal weight loss   . COPD (chronic obstructive pulmonary disease) ([place])   . Hyperlipidemia   . Osteoporosis 09/01/2010   Qualifier: Diagnosis of  By: Renae Fickle CMA, Deborra Medina), Seth Bake      Patient Active Problem List   Diagnosis Date Noted  . Orthostasis 06/12/2017  . COPD with acute exacerbation ([place]) 06/12/2017  . Heavy tobacco smoker 06/12/2017  . Benign colonic polyp 11/02/2016  . Needs flu shot 07/08/2016  . Osteopenia 02/17/2016  . Essential hypertension 02/09/2016  . Special screening for malignant neoplasms, colon 03/05/2015  . Hyperlipidemia 12/16/2014  . COPD with emphysema ([place]) 12/15/2014  . SHOULDER PAIN 08/24/2010  . HEMATURIA UNSPECIFIED 06/11/2009  . ERECTILE DYSFUNCTION, ORGANIC 06/11/2009  . SEBORRHEIC KERATOSIS 06/11/2009  . LUMBAGO 06/11/2009    Past Surgical History:  Procedure Laterality Date  . COLONOSCOPY    . INNER EAR SURGERY     hole in eardrum 30 years ago       Home Medications    Prior to Admission medications   Medication Sig Start Date End Date Taking? Authorizing Provider  Ascorbic Acid (VITAMIN C) 1000 MG tablet Take 1,000 mg by mouth daily.    [provider]  atorvastatin (LIPITOR) 10 MG tablet TAKE 1 TABLET (10 MG TOTAL) BY  MOUTH DAILY. 11/06/17   Emeterio Reeve, DO  COD LIVER OIL PO Take by mouth.    [provider]  Multiple Vitamins-Minerals (CENTRUM PO) Take by mouth.    [provider]  Omega-3 Fatty Acids (FISH OIL) 1000 MG CAPS Take by mouth.    [provider]    Family History Family History  Problem Relation Age of Onset  . Cancer Father        mouth cancer     Social History Social History   Tobacco Use  . Smoking status: Current Every Day Smoker    Packs/day: 0.50    Types: Cigarettes  . Smokeless tobacco: Never Used  Substance Use Topics  . Alcohol use: Yes    Alcohol/week: 0.0 oz    Comment: 2-3 drinks per week   . Drug use: No     Allergies   Patient has no known allergies.   Review of Systems Review of Systems Constitutional: Unremarkable HEENT: Unremarkable except for the ear issues as noted above.  Physical Exam Triage Vital Signs ED Triage Vitals  Enc Vitals Group     BP 11/16/17 1732 138/72     Pulse Rate 11/16/17 1732 72     Resp 11/16/17 1732 18     Temp 11/16/17 1732 (!) 97.4 F (36.3  C)     Temp Source 11/16/17 1732 Oral     SpO2 11/16/17 1732 94 %     Weight 11/16/17 1733 125 lb (56.7 kg)     Height 11/16/17 1733 _0  (1.753 m)     Head Circumference --      Peak Flow --      Pain Score --      Pain Loc --      Pain Edu? --      Excl. in [place]? --    No data found.  Updated Vital Signs BP 138/72 (BP Location: Right Arm)   Pulse 72   Temp (!) 97.4 F (36.3 C) (Oral)   Resp 18   Ht _1  (1.753 m)   Wt 125 lb (56.7 kg)   SpO2 94%   BMI 18.46 kg/m   Visual Acuity Right Eye Distance:   Left Eye Distance:   Bilateral Distance:    Right Eye Near:   Left Eye Near:    Bilateral Near:     Physical Exam No major acute distress.  Alert and oriented.  TMs not visible on the right with cerumen impaction.  Left ear has cerumen buildup anteriorly but I can see a small bit of the posterior portion of the drum which  looks intact.  Neck supple without nodes.  Throat clear.  Nose got congested.  UC Treatments / Results  Labs (all labs ordered are listed, but only abnormal results are displayed) Labs Reviewed - No data to display  EKG  EKG Interpretation None       Radiology No results found.  Procedures Procedures (including critical care time)  Medications Ordered in UC Medications - No data to display   Initial Impression / Assessment and Plan / UC Course  I have reviewed the triage vital signs and the nursing notes.  Pertinent labs & imaging results that were available during my care of the patient were reviewed by me and considered in my medical decision making (see chart for details).     Bilateral cerumen impaction, worse on the right.  With his history I think it is safe to irrigate his ears.  Final Clinical Impressions(s) / UC Diagnoses   Final diagnoses:  Bilateral impacted cerumen    Post irrigation drums look fine.  ED Discharge Orders    None    I recommend that you put it on your calendar as a reminder every 3 or 4 months to use some over-the-counter Debrox in your ears and flush them out with the little support bulb that comes with the Debrox kit.  If you stay ahead of the wax like this, but you can avoid having to periodically come in and get your ears irrigated.  In the event of ear pain or hearing loss or inability to get the wax out on your own get checked .   Controlled Substance Prescriptions [place] Controlled Substance Registry consulted? Not Applicable   Posey Boyer, MD 11/16/17 [name]    Posey Boyer, MD 11/16/17 [id number]

## 2018-01-11 ENCOUNTER — Ambulatory Visit (INDEPENDENT_AMBULATORY_CARE_PROVIDER_SITE_OTHER): Payer: Medicare Other | Admitting: Osteopathic Medicine

## 2018-01-11 ENCOUNTER — Encounter: Payer: Self-pay | Admitting: Osteopathic Medicine

## 2018-01-11 VITALS — BP 125/66 | HR 76 | Temp 98.1°F | Wt 120.0 lb

## 2018-01-11 DIAGNOSIS — B9689 Other specified bacterial agents as the cause of diseases classified elsewhere: Secondary | ICD-10-CM

## 2018-01-11 DIAGNOSIS — J329 Chronic sinusitis, unspecified: Secondary | ICD-10-CM

## 2018-01-11 DIAGNOSIS — J439 Emphysema, unspecified: Secondary | ICD-10-CM | POA: Diagnosis not present

## 2018-01-11 MED ORDER — IPRATROPIUM BROMIDE 0.06 % NA SOLN: 2.0000 | Freq: Four times a day (QID) | NASAL | 1 refills | Status: DC | PRN

## 2018-01-11 MED ORDER — AMOXICILLIN-POT CLAVULANATE 875-125 MG PO TABS: 1.0000 | ORAL_TABLET | Freq: Two times a day (BID) | ORAL | 0 refills | Status: DC

## 2018-01-11 MED ORDER — PREDNISONE 20 MG PO TABS: 20.0000 mg | ORAL_TABLET | Freq: Two times a day (BID) | ORAL | 0 refills | Status: DC

## 2018-01-11 NOTE — Progress Notes (Signed)
HPI: Timothy Waller is a 84 y.o. male who  has a past medical history of Abnormal weight loss, COPD (chronic obstructive pulmonary disease) ([place]), Hyperlipidemia, and Osteoporosis (09/01/2010).  he presents to [facility] today, 01/11/18,  for chief complaint of: Sinus congestion  Sinus congestion about a week. Taking sudafed. Doesn't seem to be helping. Some cough, he attributes this to smoking, no SOB or mucus production.    Past medical, surgical, social and family history reviewed:  Patient Active Problem List   Diagnosis Date Noted  . Orthostasis 06/12/2017  . COPD with acute exacerbation ([place]) 06/12/2017  . Heavy tobacco smoker 06/12/2017  . Benign colonic polyp 11/02/2016  . Needs flu shot 07/08/2016  . Osteopenia 02/17/2016  . Essential hypertension 02/09/2016  . Special screening for malignant neoplasms, colon 03/05/2015  . Hyperlipidemia 12/16/2014  . COPD with emphysema ([place]) 12/15/2014  . SHOULDER PAIN 08/24/2010  . HEMATURIA UNSPECIFIED 06/11/2009  . ERECTILE DYSFUNCTION, ORGANIC 06/11/2009  . SEBORRHEIC KERATOSIS 06/11/2009  . LUMBAGO 06/11/2009    Past Surgical History:  Procedure Laterality Date  . COLONOSCOPY    . INNER EAR SURGERY     hole in eardrum 30 years ago    Social History   Tobacco Use  . Smoking status: Current Every Day Smoker    Packs/day: 0.50    Types: Cigarettes  . Smokeless tobacco: Never Used  Substance Use Topics  . Alcohol use: Yes    Alcohol/week: 0.0 oz    Comment: 2-3 drinks per week     Family History  Problem Relation Age of Onset  . Cancer Father        mouth cancer      Current medication list and allergy/intolerance information reviewed:    Current Outpatient Medications  Medication Sig Dispense Refill  . Ascorbic Acid (VITAMIN C) 1000 MG tablet Take 1,000 mg by mouth daily.    Marland Kitchen atorvastatin (LIPITOR) 10 MG tablet TAKE 1 TABLET (10 MG TOTAL) BY MOUTH DAILY. 90 tablet 3  .  COD LIVER OIL PO Take by mouth.    . Multiple Vitamins-Minerals (CENTRUM PO) Take by mouth.    . Omega-3 Fatty Acids (FISH OIL) 1000 MG CAPS Take by mouth.     No current facility-administered medications for this visit.     No Known Allergies    Review of Systems:  Constitutional:  No  fever, no chills, +recent illness, No unintentional weight changes. +fatigue.   HEENT: No  headache, no vision change, no hearing change, No sore throat, +sinus pressure  Cardiac: No  chest pain, No  pressure, No palpitations  Respiratory:  No  shortness of breath. +Cough  Gastrointestinal: No  abdominal pain, No  nausea, No  vomiting,  No  blood in stool, No  diarrhea  Musculoskeletal: No new myalgia/arthralgia  Skin: No  Rash  Neurologic: No  weakness, No  dizziness  Exam:  BP 125/66 (BP Location: Left Arm)   Pulse 76   Temp 98.1 F (36.7 C) (Oral)   Wt 120 lb 0.6 oz (54.4 kg)   BMI 17.73 kg/m   Constitutional: VS see above. General Appearance: alert, well-developed, well-nourished, NAD  Eyes: Normal lids and conjunctive, non-icteric sclera  Ears, Nose, Mouth, Throat: MMM, Normal external inspection ears/nares/mouth/lips/gums. TM normal bilaterally. Pharynx/tonsils no erythema but +postnasal drip, no exudate. Nasal mucosa normal.   Neck: No masses, trachea midline. No tenderness/mass appreciated. No lymphadenopathy  Respiratory: Normal respiratory effort. no wheeze, no rhonchi,  no rales  Cardiovascular: S1/S2 normal, no murmur, no rub/gallop auscultated. RRR. No lower extremity edema.   Gastrointestinal: Nontender, no masses. Bowel sounds normal.  Musculoskeletal: Gait normal.   Neurological: Normal balance/coordination. No tremor.   Skin: warm, dry, intact.   Psychiatric: Normal judgment/insight. Normal mood and affect.     ASSESSMENT/PLAN: The primary encounter diagnosis was Bacterial sinusitis. A diagnosis of Pulmonary emphysema, unspecified emphysema type ([place]) was  also pertinent to this visit.  No apparent COPD exacerbation but would fill steroids and call us if worse cough.      Meds ordered this encounter  Medications  . ipratropium (ATROVENT) 0.06 % nasal spray    Sig: Place 2 sprays into both nostrils 4 (four) times daily as needed for rhinitis.    Dispense:  15 mL    Refill:  1  . amoxicillin-clavulanate (AUGMENTIN) 875-125 MG tablet    Sig: Take 1 tablet by mouth 2 (two) times daily.    Dispense:  14 tablet    Refill:  0  . predniSONE (DELTASONE) 20 MG tablet    Sig: Take 1 tablet (20 mg total) by mouth 2 (two) times daily with a meal. Fill and call office if cough worse or short of breath    Dispense:  10 tablet    Refill:  0      Visit summary with medication list and pertinent instructions was printed for patient to review. All questions at time of visit were answered - patient instructed to contact office with any additional concerns. ER/RTC precautions were reviewed with the patient.   Follow-up plan: Return if symptoms worsen or fail to improve.    Please note: voice recognition software was used to produce this document, and typos may escape review. Please contact Dr. Sheppard Coil for any needed clarifications.

## 2018-01-31 ENCOUNTER — Ambulatory Visit (INDEPENDENT_AMBULATORY_CARE_PROVIDER_SITE_OTHER): Payer: Medicare Other | Admitting: Osteopathic Medicine

## 2018-01-31 ENCOUNTER — Encounter: Payer: Self-pay | Admitting: Osteopathic Medicine

## 2018-01-31 VITALS — BP 144/71 | HR 66 | Temp 97.8°F | Wt 118.1 lb

## 2018-01-31 DIAGNOSIS — R350 Frequency of micturition: Secondary | ICD-10-CM | POA: Diagnosis not present

## 2018-01-31 DIAGNOSIS — R809 Proteinuria, unspecified: Secondary | ICD-10-CM | POA: Insufficient documentation

## 2018-01-31 LAB — POCT URINALYSIS DIPSTICK
BILIRUBIN UA: NEGATIVE
GLUCOSE UA: NEGATIVE
Ketones, UA: NEGATIVE
Nitrite, UA: NEGATIVE
Protein, UA: 100
RBC UA: NEGATIVE
Urobilinogen, UA: 0.2 E.U./dL
pH, UA: 5.5 (ref 5.0–8.0)

## 2018-01-31 MED ORDER — CIPROFLOXACIN HCL 500 MG PO TABS: 500.0000 mg | ORAL_TABLET | Freq: Two times a day (BID) | ORAL | 0 refills | Status: DC

## 2018-01-31 NOTE — Progress Notes (Signed)
HPI: Timothy Waller is a 84 y.o. male who  has a past medical history of Abnormal weight loss, COPD (chronic obstructive pulmonary disease) ([place]), Hyperlipidemia, and Osteoporosis (09/01/2010).  he presents to [facility] today, 01/31/18,  for chief complaint of:  Urinary frequency  Ongoing for the past 3-4 days, noticing more frequent urination. No pain, no penile discharge. No malodorous urine, no hematuria.  History of hematuria is on patient's problem list but he cannot recall ever having had blood in the urine   Past medical history, surgical history, social history and family history reviewed. No updates needed.   Current medication list and allergy/intolerance information reviewed.    Current Outpatient Medications on File Prior to Visit  Medication Sig Dispense Refill  . amoxicillin-clavulanate (AUGMENTIN) 875-125 MG tablet Take 1 tablet by mouth 2 (two) times daily. 14 tablet 0  . Ascorbic Acid (VITAMIN C) 1000 MG tablet Take 1,000 mg by mouth daily.    Marland Kitchen atorvastatin (LIPITOR) 10 MG tablet TAKE 1 TABLET (10 MG TOTAL) BY MOUTH DAILY. 90 tablet 3  . COD LIVER OIL PO Take by mouth.    Marland Kitchen ipratropium (ATROVENT) 0.06 % nasal spray Place 2 sprays into both nostrils 4 (four) times daily as needed for rhinitis. 15 mL 1  . Multiple Vitamins-Minerals (CENTRUM PO) Take by mouth.    . Omega-3 Fatty Acids (FISH OIL) 1000 MG CAPS Take by mouth.    . predniSONE (DELTASONE) 20 MG tablet Take 1 tablet (20 mg total) by mouth 2 (two) times daily with a meal. Fill and call office if cough worse or short of breath 10 tablet 0   No current facility-administered medications on file prior to visit.    No Known Allergies    Review of Systems:  Constitutional: No recent illness  HEENT: No  headache, no vision change  Cardiac: No  chest pain, No  pressure, No palpitations  Respiratory:  No  shortness of breath. No  Cough  Gastrointestinal: No  abdominal  pain, no change on bowel habits  Musculoskeletal: No new myalgia/arthralgia  Skin: No  Rash  Hem/Onc: No  easy bruising/bleeding, No  abnormal lumps/bumps  Neurologic: No  weakness, No  Dizziness  Psychiatric: No  concerns with depression, No  concerns with anxiety  Exam:  BP (!) 144/71 (BP Location: Left Arm, Patient Position: Sitting, Cuff Size: Small)   Pulse 66   Temp 97.8 F (36.6 C) (Oral)   Wt 118 lb 1.3 oz (53.6 kg)   BMI 17.44 kg/m   Constitutional: VS see above. General Appearance: alert, well-developed, well-nourished, NAD  Eyes: Normal lids and conjunctive, non-icteric sclera  Ears, Nose, Mouth, Throat: MMM, Normal external inspection ears/nares/mouth/lips/gums.  Neck: No masses, trachea midline.   Respiratory: Normal respiratory effort. no wheeze, no rhonchi, no rales  Cardiovascular: S1/S2 normal, no murmur, no rub/gallop auscultated. RRR.   Musculoskeletal: Gait normal. Symmetric and independent movement of all extremities  Neurological: Normal balance/coordination. No tremor.  Skin: warm, dry, intact.   Psychiatric: Normal judgment/insight. Normal mood and affect. Oriented x3.    Results for orders placed or performed in visit on 01/31/18 (from the past 72 hour(s))  POCT Urinalysis Dipstick     Status: Abnormal   Collection Time: 01/31/18  1:33 PM  Result Value Ref Range   Color, UA YELLOW    Clarity, UA CLEAR    Glucose, UA NEGATIVE    Bilirubin, UA NEGATIVE    Ketones, UA NEGATIVE  Spec Grav, UA >=1.030 (A) 1.010 - 1.025   Blood, UA NEGATIVE    pH, UA 5.5 5.0 - 8.0   Protein, UA 100    Urobilinogen, UA 0.2 0.2 or 1.0 E.U./dL   Nitrite, UA NEGATIVE    Leukocytes, UA Trace (A) Negative   Appearance     Odor      Previous results reviewed: Urinalysis 06/13/2009: No WBC, RBC, bacteria. History of hematuria is on his problem list from previous provider, I don't see any urinalysis results in the system to confirm  this.   ASSESSMENT/PLAN: trace leukocytes, proteinuria. No fever/abdominal pain. We'll go ahead and treat as computed UTI in male, await further urinalysis and renal function testing.   Frequent urination - Plan: Urine Culture, Urinalysis, microscopic only, POCT Urinalysis Dipstick, COMPLETE METABOLIC PANEL WITH GFR  Proteinuria, unspecified type - Plan: COMPLETE METABOLIC PANEL WITH GFR   Meds ordered this encounter  Medications  . ciprofloxacin (CIPRO) 500 MG tablet    Sig: Take 1 tablet (500 mg total) by mouth 2 (two) times daily.    Dispense:  20 tablet    Refill:  0      Follow-up plan: Return if symptoms worsen or fail to improve within 1 week .  Visit summary with medication list and pertinent instructions was printed for patient to review, alert [place] if any changes needed. All questions at time of visit were answered - patient instructed to contact office with any additional concerns. ER/RTC precautions were reviewed with the patient and understanding verbalized.     Please note: voice recognition software was used to produce this document, and typos may escape review. Please contact Dr. Sheppard Coil for any needed clarifications.

## 2018-02-01 LAB — COMPLETE METABOLIC PANEL WITH GFR
AG Ratio: 1.5 (calc) (ref 1.0–2.5)
ALBUMIN MSPROF: 4.2 g/dL (ref 3.6–5.1)
ALT: 21 U/L (ref 9–46)
AST: 27 U/L (ref 10–35)
Alkaline phosphatase (APISO): 59 U/L (ref 40–115)
BILIRUBIN TOTAL: 0.7 mg/dL (ref 0.2–1.2)
BUN / CREAT RATIO: 27 (calc) — AB (ref 6–22)
BUN: 30 mg/dL — AB (ref 7–25)
CO2: 28 mmol/L (ref 20–32)
CREATININE: 1.11 mg/dL (ref 0.70–1.11)
Calcium: 9.6 mg/dL (ref 8.6–10.3)
Chloride: 103 mmol/L (ref 98–110)
GFR, EST AFRICAN AMERICAN: 72 mL/min/{1.73_m2} (ref >=60)
GFR, Est Non African American: 62 mL/min/{1.73_m2} (ref >=60)
GLOBULIN: 2.8 g/dL (ref 1.9–3.7)
Glucose, Bld: 89 mg/dL (ref 65–99)
Potassium: 4.6 mmol/L (ref 3.5–5.3)
SODIUM: 139 mmol/L (ref 135–146)
TOTAL PROTEIN: 7 g/dL (ref 6.1–8.1)

## 2018-02-02 LAB — URINALYSIS, MICROSCOPIC ONLY
BACTERIA UA: NONE SEEN /HPF
Hyaline Cast: NONE SEEN /LPF
Squamous Epithelial / LPF: NONE SEEN /HPF (ref <=5)

## 2018-02-02 LAB — URINE CULTURE
MICRO NUMBER: 90444352
RESULT: NO GROWTH
SPECIMEN QUALITY: ADEQUATE

## 2018-02-02 NOTE — Addendum Note (Signed)
Addended by: Maryla Morrow on: 02/02/2018 01:29 PM   Modules accepted: Orders

## 2018-05-08 ENCOUNTER — Ambulatory Visit: Payer: Medicare Other | Admitting: Osteopathic Medicine

## 2018-05-08 ENCOUNTER — Ambulatory Visit (INDEPENDENT_AMBULATORY_CARE_PROVIDER_SITE_OTHER): Payer: Medicare Other | Admitting: Osteopathic Medicine

## 2018-05-08 ENCOUNTER — Encounter: Payer: Self-pay | Admitting: Osteopathic Medicine

## 2018-05-08 VITALS — BP 131/62 | HR 65 | Temp 98.1°F | Wt 117.7 lb

## 2018-05-08 DIAGNOSIS — J439 Emphysema, unspecified: Secondary | ICD-10-CM

## 2018-05-08 MED ORDER — ATORVASTATIN CALCIUM 10 MG PO TABS: ORAL_TABLET | ORAL | 3 refills | Status: DC

## 2018-05-08 NOTE — Progress Notes (Signed)
HPI: Timothy Waller is a 84 y.o. male who  has a past medical history of Abnormal weight loss, COPD (chronic obstructive pulmonary disease) ([place]), Hyperlipidemia, and Osteoporosis (09/01/2010).  he presents to [facility] today, 05/08/18,  for chief complaint of:  Follow-up COPD/emphysema   Doing well, no breathing problems, no frequent respiratory infections. UTD on pneumonia vax, gets flu vax every year, no limitation to ADL, would like to avoid inhalers.   Immunization History  Administered Date(s) Administered  . Influenza, High Dose Seasonal PF 06/12/2017  . Influenza,inj,Quad PF,6+ Mos 07/08/2016  . Influenza-Unspecified 07/24/2014, 07/22/2015  . Pneumococcal Polysaccharide-23 10/24/2005  . Pneumococcal-Unspecified 08/24/2014  . Td 10/24/2005  . Zoster 02/07/2014       Past medical history, surgical history, and family history reviewed.  Current medication list and allergy/intolerance information reviewed.   (See remainder of HPI, ROS, Phys Exam below)    ASSESSMENT/PLAN:   Pulmonary emphysema, unspecified emphysema type ([place])   Meds ordered this encounter  Medications  . atorvastatin (LIPITOR) 10 MG tablet    Sig: TAKE 1 TABLET (10 MG TOTAL) BY MOUTH DAILY.    Dispense:  90 tablet    Refill:  3     Follow-up plan: Return for 10/2018 for medicare annual check-up, nurse visit 06/2018 or 07/2018 for nurse visit flu shot .     ############################################ ############################################ ############################################ ############################################    Outpatient Encounter Medications as of 05/08/2018  Medication Sig  . Ascorbic Acid (VITAMIN C) 1000 MG tablet Take 1,000 mg by mouth daily.  Marland Kitchen atorvastatin (LIPITOR) 10 MG tablet TAKE 1 TABLET (10 MG TOTAL) BY MOUTH DAILY.  . COD LIVER OIL PO Take by mouth.  . Multiple Vitamins-Minerals (CENTRUM PO) Take by mouth.  .  Omega-3 Fatty Acids (FISH OIL) 1000 MG CAPS Take by mouth.  . ciprofloxacin (CIPRO) 500 MG tablet Take 1 tablet (500 mg total) by mouth 2 (two) times daily. (Patient not taking: Reported on 05/08/2018)   No facility-administered encounter medications on file as of 05/08/2018.    No Known Allergies    Review of Systems:  Constitutional: No recent illness  Cardiac: No  chest pain, No  pressure, No palpitations  Respiratory:  No  shortness of breath. No  Cough  Musculoskeletal: No new myalgia/arthralgia  Neurologic: No  weakness, No  Dizziness   Exam:  BP 131/62 (BP Location: Left Arm, Patient Position: Sitting, Cuff Size: Small)   Pulse 65   Temp 98.1 F (36.7 C) (Oral)   Wt 117 lb 11.2 oz (53.4 kg)   BMI 17.38 kg/m   Constitutional: VS see above. General Appearance: alert, well-developed, thin, NAD  Eyes: Normal lids and conjunctive, non-icteric sclera  Ears, Nose, Mouth, Throat: MMM, Normal external inspection ears/nares/mouth/lips/gums.  Neck: No masses, trachea midline.   Respiratory: Normal respiratory effort. no wheeze, no rhonchi, no rales, diminished breath sounds bilaterally  Cardiovascular: S1/S2 normal, no murmur, no rub/gallop auscultated. RRR.   Musculoskeletal: Gait normal. Symmetric and independent movement of all extremities  Neurological: Normal balance/coordination. No tremor.  Skin: warm, dry, intact.   Psychiatric: Normal judgment/insight. Normal mood and affect. Oriented x3.   Visit summary with medication list and pertinent instructions was printed for patient to review, advised to alert [place] if any changes needed. All questions at time of visit were answered - patient instructed to contact office with any additional concerns. ER/RTC precautions were reviewed with the patient and understanding verbalized.   Follow-up plan: Return for  10/2018 for medicare annual check-up, nurse visit 06/2018 or 07/2018 for nurse visit flu shot .  Note: Total time  spent 10 minutes, greater than 50% of the visit was spent face-to-face counseling and coordinating care for the following: The encounter diagnosis was Pulmonary emphysema, unspecified emphysema type ([place])..  Please note: voice recognition software was used to produce this document, and typos may escape review. Please contact Dr. Sheppard Coil for any needed clarifications.

## 2018-07-19 ENCOUNTER — Ambulatory Visit (INDEPENDENT_AMBULATORY_CARE_PROVIDER_SITE_OTHER): Payer: Medicare Other | Admitting: Physician Assistant

## 2018-07-19 DIAGNOSIS — Z23 Encounter for immunization: Secondary | ICD-10-CM | POA: Diagnosis not present

## 2018-11-05 ENCOUNTER — Emergency Department (INDEPENDENT_AMBULATORY_CARE_PROVIDER_SITE_OTHER)
Admission: EM | Admit: 2018-11-05 | Discharge: 2018-11-05 | Disposition: A | Discharge status: Home / Self Care | Payer: Medicare Other | Source: Home / Self Care | Attending: Family Medicine | Admitting: Family Medicine

## 2018-11-05 ENCOUNTER — Other Ambulatory Visit: Payer: Self-pay

## 2018-11-05 DIAGNOSIS — H6123 Impacted cerumen, bilateral: Secondary | ICD-10-CM | POA: Diagnosis not present

## 2018-11-05 NOTE — ED Provider Notes (Signed)
Timothy [facility]er C[facility]    CSN: [id number] Arrival date & time: 11/05/18  1740     History   Chief Complaint Chief Complaint  Patient presents with  . Ear Fullness    HPI Timothy Waller is a 85 y.o. male.   Patient reports that his right ear has felt clogged during the past several days, without pain.  He denies nasal congestion or recent URI.  The history is provided by the patient.    Past Medical History:  Diagnosis Date  . Abnormal weight loss   . COPD (chronic obstructive pulmonary disease) ([place])   . Hyperlipidemia   . Osteoporosis 09/01/2010   Qualifier: Diagnosis of  By: Renae Fickle CMA, Deborra Medina), Seth Bake      Patient Active Problem List   Diagnosis Date Noted  . Proteinuria 01/31/2018  . Frequent urination 01/31/2018  . Orthostasis 06/12/2017  . Heavy tobacco smoker 06/12/2017  . Benign colonic polyp 11/02/2016  . Needs flu shot 07/08/2016  . Osteopenia 02/17/2016  . Essential hypertension 02/09/2016  . Special screening for malignant neoplasms, colon 03/05/2015  . Hyperlipidemia 12/16/2014  . COPD with emphysema ([place]) 12/15/2014  . SHOULDER PAIN 08/24/2010  . HEMATURIA UNSPECIFIED 06/11/2009  . ERECTILE DYSFUNCTION, ORGANIC 06/11/2009  . SEBORRHEIC KERATOSIS 06/11/2009  . LUMBAGO 06/11/2009    Past Surgical History:  Procedure Laterality Date  . COLONOSCOPY    . INNER EAR SURGERY     hole in eardrum 30 years ago       Home Medications    Prior to Admission medications   Medication Sig Start Date End Date Taking? Authorizing Provider  Ascorbic Acid (VITAMIN C) 1000 MG tablet Take 1,000 mg by mouth daily.    [provider]  atorvastatin (LIPITOR) 10 MG tablet TAKE 1 TABLET (10 MG TOTAL) BY MOUTH DAILY. 05/08/18   Emeterio Reeve, DO  COD LIVER OIL PO Take by mouth.    [provider]  Multiple Vitamins-Minerals (CENTRUM PO) Take by mouth.    [provider]  Omega-3 Fatty Acids (FISH OIL) 1000 MG CAPS Take by mouth.     [provider]    Family History Family History  Problem Relation Age of Onset  . Cancer Father        mouth cancer     Social History Social History   Tobacco Use  . Smoking status: Current Every Day Smoker    Packs/day: 0.50    Types: Cigarettes  . Smokeless tobacco: Never Used  Substance Use Topics  . Alcohol use: Yes    Alcohol/week: 0.0 standard drinks    Comment: 2-3 drinks per week   . Drug use: No     Allergies   Patient has no known allergies.   Review of Systems Review of Systems No sore throat No cough No pleuritic pain No wheezing No nasal congestion No post-nasal drainage No sinus pain/pressure No itchy/red eyes No earache; right ear feel clogged No hemoptysis No SOB No fever/chills No nausea No vomiting No abdominal pain No diarrhea No urinary symptoms No skin rash No fatigue No myalgias No headache    Physical Exam Triage Vital Signs ED Triage Vitals  Enc Vitals Group     BP 11/05/18 1808 117/75     Pulse Rate 11/05/18 1808 76     Resp 11/05/18 1808 18     Temp --      Temp src --      SpO2 11/05/18 1808 92 %  Weight 11/05/18 1809 117 lb (53.1 kg)     Height 11/05/18 1809 5\' 9"  (1.753 m)     Head Circumference --      Peak Flow --      Pain Score 11/05/18 1809 0     Pain Loc --      Pain Edu? --      Excl. in [place]? --    No data found.  Updated Vital Signs BP 117/75 (BP Location: Right Arm)   Pulse 76   Resp 18   Ht 5\' 9"  (1.753 m)   Wt 53.1 kg   SpO2 92%   BMI 17.28 kg/m   Visual Acuity Right Eye Distance:   Left Eye Distance:   Bilateral Distance:    Right Eye Near:   Left Eye Near:    Bilateral Near:     Physical Exam Vitals signs and nursing note reviewed.  Constitutional:      General: He is not in acute distress. HENT:     Head: Normocephalic.     Comments: After lavage, left canal normal.  Right canal partly occluded with cerumen distally, but tympanic membrane appears normal.     Right Ear: There is impacted cerumen.     Left Ear: There is impacted cerumen.     Nose: Nose normal.  Eyes:     Pupils: Pupils are equal, round, and reactive to light.  Cardiovascular:     Rate and Rhythm: Normal rate.  Pulmonary:     Breath sounds: Normal breath sounds.  Lymphadenopathy:     Cervical: No cervical adenopathy.  Skin:    General: Skin is warm and dry.  Neurological:     Mental Status: He is alert.      UC Treatments / Results  Labs (all labs ordered are listed, but only abnormal results are displayed) Labs Reviewed - No data to display  EKG None  Radiology No results found.  Procedures Procedures  Bilateral ear lavage by nurse  Medications Ordered in UC Medications - No data to display  Initial Impression / Assessment and Plan / UC Course  I have reviewed the triage vital signs and the nursing notes.  Pertinent labs & imaging results that were available during my care of the patient were reviewed by me and considered in my medical decision making (see chart for details).    Followup with Family Doctor in about 2 weeks.   Final Clinical Impressions(s) / UC Diagnoses   Final diagnoses:  Bilateral impacted cerumen     Discharge Instructions      To prevent recurrent ear wax blockage, try the following: Soak two cotton balls with mineral oil, and gently place in each ear canal once weekly.  Leave the cotton balls in place for 10 to 20 minutes.  This will help liquefy the ear wax and aid your body's normal elimination process.  If applicable, do not use a hearing aid for 8 hours overnight.  Have your ears cleaned by a health professional every 6 to 12 months.  Avoid using "Q-tips" and ear wax softening solutions     ED Prescriptions    None         Kandra Nicolas, MD 11/07/18 1850

## 2018-11-05 NOTE — ED Triage Notes (Signed)
Pt stated that ear has become clogged over the past 2-3 days.

## 2018-11-05 NOTE — Discharge Instructions (Addendum)
°  To prevent recurrent ear wax blockage, try the following: Soak two cotton balls with mineral oil, and gently place in each ear canal once weekly.  Leave the cotton balls in place for 10 to 20 minutes.  This will help liquefy the ear wax and aid your body's normal elimination process.  If applicable, do not use a hearing aid for 8 hours overnight.  Have your ears cleaned by a health professional every 6 to 12 months.  Avoid using "Q-tips" and ear wax softening solutions

## 2018-11-06 ENCOUNTER — Encounter: Payer: Self-pay | Admitting: Osteopathic Medicine

## 2018-11-06 ENCOUNTER — Ambulatory Visit (INDEPENDENT_AMBULATORY_CARE_PROVIDER_SITE_OTHER): Payer: Medicare Other | Admitting: Osteopathic Medicine

## 2018-11-06 VITALS — BP 133/62 | HR 66 | Temp 97.7°F | Wt 114.4 lb

## 2018-11-06 DIAGNOSIS — R319 Hematuria, unspecified: Secondary | ICD-10-CM

## 2018-11-06 DIAGNOSIS — E7849 Other hyperlipidemia: Secondary | ICD-10-CM

## 2018-11-06 DIAGNOSIS — R634 Abnormal weight loss: Secondary | ICD-10-CM

## 2018-11-06 DIAGNOSIS — M8580 Other specified disorders of bone density and structure, unspecified site: Secondary | ICD-10-CM

## 2018-11-06 DIAGNOSIS — R809 Proteinuria, unspecified: Secondary | ICD-10-CM

## 2018-11-06 DIAGNOSIS — R7309 Other abnormal glucose: Secondary | ICD-10-CM | POA: Diagnosis not present

## 2018-11-06 DIAGNOSIS — J439 Emphysema, unspecified: Secondary | ICD-10-CM | POA: Diagnosis not present

## 2018-11-06 MED ORDER — ZOSTER VAC RECOMB ADJUVANTED 50 MCG/0.5ML IM SUSR: 0.5000 mL | Freq: Once | INTRAMUSCULAR | 1 refills | Status: AC

## 2018-11-06 MED ORDER — TETANUS-DIPHTH-ACELL PERTUSSIS 5-2-15.5 LF-MCG/0.5 IM SUSP: 0.5000 mL | Freq: Once | INTRAMUSCULAR | 0 refills | Status: AC

## 2018-11-06 NOTE — Progress Notes (Signed)
HPI: Timothy Waller is a 85 y.o. male  who presents to [facility] today, 11/06/18,  for Medicare Annual Wellness Exam  Patient presents for annual physical/Medicare wellness exam. No complaints today other than noting his clothes fit a bit looser, worried about weight loss. See below for weight measurements.    BP Readings from Last 3 Encounters:  11/06/18 133/62  11/05/18 117/75  05/08/18 131/62   Wt Readings from Last 3 Encounters:  11/06/18 114 lb 6.4 oz (51.9 kg)  11/05/18 117 lb (53.1 kg)  05/08/18 117 lb 11.2 oz (53.4 kg)     Past medical, surgical, social and family history reviewed:  Patient Active Problem List   Diagnosis Date Noted  . Proteinuria 01/31/2018  . Frequent urination 01/31/2018  . Orthostasis 06/12/2017  . Heavy tobacco smoker 06/12/2017  . Benign colonic polyp 11/02/2016  . Needs flu shot 07/08/2016  . Osteopenia 02/17/2016  . Essential hypertension 02/09/2016  . Special screening for malignant neoplasms, colon 03/05/2015  . Hyperlipidemia 12/16/2014  . COPD with emphysema ([place]) 12/15/2014  . SHOULDER PAIN 08/24/2010  . HEMATURIA UNSPECIFIED 06/11/2009  . ERECTILE DYSFUNCTION, ORGANIC 06/11/2009  . SEBORRHEIC KERATOSIS 06/11/2009  . LUMBAGO 06/11/2009    Past Surgical History:  Procedure Laterality Date  . COLONOSCOPY    . INNER EAR SURGERY     hole in eardrum 30 years ago    Social History   Socioeconomic History  . Marital status: Married    Spouse name: Not on file  . Number of children: Not on file  . Years of education: Not on file  . Highest education level: Not on file  Occupational History  . Not on file  Social Needs  . Financial resource strain: Not on file  . Food insecurity:    Worry: Not on file    Inability: Not on file  . Transportation needs:    Medical: Not on file    Non-medical: Not on file  Tobacco Use  . Smoking status: Current Every Day Smoker    Packs/day: 0.50   Types: Cigarettes  . Smokeless tobacco: Never Used  Substance and Sexual Activity  . Alcohol use: Yes    Alcohol/week: 0.0 standard drinks    Comment: 2-3 drinks per week   . Drug use: No  . Sexual activity: Not Currently  Lifestyle  . Physical activity:    Days per week: Not on file    Minutes per session: Not on file  . Stress: Not on file  Relationships  . Social connections:    Talks on phone: Not on file    Gets together: Not on file    Attends religious service: Not on file    Active member of club or organization: Not on file    Attends meetings of clubs or organizations: Not on file    Relationship status: Not on file  . Intimate partner violence:    Fear of current or ex partner: Not on file    Emotionally abused: Not on file    Physically abused: Not on file    Forced sexual activity: Not on file  Other Topics Concern  . Not on file  Social History Narrative  . Not on file    Family History  Problem Relation Age of Onset  . Cancer Father        mouth cancer      Current medication list and allergy/intolerance information reviewed:    Outpatient Encounter  Medications as of 11/06/2018  Medication Sig  . Ascorbic Acid (VITAMIN C) 1000 MG tablet Take 1,000 mg by mouth daily.  Marland Kitchen atorvastatin (LIPITOR) 10 MG tablet TAKE 1 TABLET (10 MG TOTAL) BY MOUTH DAILY.  . COD LIVER OIL PO Take by mouth.  . Multiple Vitamins-Minerals (CENTRUM PO) Take by mouth.  . Omega-3 Fatty Acids (FISH OIL) 1000 MG CAPS Take by mouth.   No facility-administered encounter medications on file as of 11/06/2018.     No Known Allergies     Review of Systems: Review of Systems - Negative except some weight loss past few mos.    Medicare Wellness Questionnaire  Are there smokers in your home (other than you)? no  Depression Screen (Note: if answer to either of the following is "Yes", a more complete depression screening is indicated)   Q1: Over the past two weeks, have you felt  down, depressed or hopeless? no  Q2: Over the past two weeks, have you felt little interest or pleasure in doing things? no  Have you lost interest or pleasure in daily life? no  Do you often feel hopeless? no  Do you cry easily over simple problems? no  Activities of Daily Living In your present state of health, do you have any difficulty performing the following activities?:  Driving? no Managing money?  no Feeding yourself? no Getting from bed to chair? no Climbing a flight of stairs? no Preparing food and eating?: no Bathing or showering? no Getting dressed: no Getting to the toilet? no Using the toilet: no Moving around from place to place: no In the past year have you fallen or had a near fall?: no  Hearing Difficulties:  Do you often ask people to speak up or repeat themselves? no Do you experience ringing or noises in your ears? no  Do you have difficulty understanding soft or whispered voices? no  Memory Difficulties:  Do you feel that you have a problem with memory? no  Do you often misplace items? no  Do you feel safe at home?  yes  Sexual Health:   Are you sexually active?  Yes  Do you have more than one partner?  No  Advanced Directives:   Advanced directives discussed: has NO advanced directive  - add't info requested. Referral to SW: n/a  Additional information provided: yes  Risk Factors  Current exercise habits: none  Dietary issues discussed:no concerns  Cardiac risk factors: smoker, hypercholesterolemia/hyperlipidemia, COPD, positive family history   Exam:  BP 133/62 (BP Location: Left Arm, Patient Position: Sitting, Cuff Size: Normal)   Pulse 66   Temp 97.7 F (36.5 C) (Oral)   Wt 114 lb 6.4 oz (51.9 kg)   BMI 16.89 kg/m  Vision by Snellen chart: right eye:see nurse notes, left eye:see nurse notes  Constitutional: VS see above. General Appearance: alert, well-developed, well-nourished, NAD  Ears, Nose, Mouth, Throat: MMM  Neck: No masses,  trachea midline.   Respiratory: Normal respiratory effort. no wheeze, no rhonchi, no rales  Cardiovascular:No lower extremity edema.   Musculoskeletal: Gait normal. No clubbing/cyanosis of digits.   Neurological: Normal balance/coordination. No tremor. Recalls 3 objects and able to read face of watch with correct time.   Skin: warm, dry, intact. No rash/ulcer.   Psychiatric: Normal judgment/insight. Normal mood and affect. Oriented x3.     ASSESSMENT/PLAN:   Encounter for Medicare annual wellness exam  No diagnosis found.  CANCER SCREENING  Lung - USPSTF: 55-80yo w/ 30 py hx unless  quit w/in 36yr - does not need  Colon - does not need  Prostate - does not need  Breast - does not need  Cervical - does not need OTHER DISEASE SCREENING  Lipid - needs  DM2 - needs  AAA - male 27-75yo ever smoked - does not need  Osteoporosis - women 83yo+, men 83yo+ - needs INFECTIOUS DISEASE SCREENING  HIV - does not need  GC/CT - does not need  HepC -does not need  TB - does not need ADULT VACCINATION  Influenza - annual vaccine recommended  Td - booster every 10 years - needs maybe - will try to get records  Zoster - option at 27, yes at 19+ - needs  PCV13 - does not need  PPSV23 - does not need Immunization History  Administered Date(s) Administered  . Influenza, High Dose Seasonal PF 06/12/2017, 07/19/2018  . Influenza,inj,Quad PF,6+ Mos 07/08/2016  . Influenza-Unspecified 07/24/2014, 07/22/2015  . Pneumococcal Polysaccharide-23 10/24/2005  . Pneumococcal-Unspecified 08/24/2014  . Td 10/24/2005  . Zoster 02/07/2014   OTHER  Fall - exercise and Vit D age 8+ - does not need  Advanced Directives -  Discussed as above   During the course of the visit the patient was educated and counseled about appropriate screening and preventive services as noted above.   Patient Instructions (the written plan) was given to the patient.  Medicare Attestation I have  personally reviewed: The patient's medical and social history Their use of alcohol, tobacco or illicit drugs Their current medications and supplements The patient's functional ability including ADLs,fall risks, home safety risks, cognitive, and hearing and visual impairment Diet and physical activities Evidence for depression or mood disorders  The patient's weight, height, BMI, and visual acuity have been recorded in the chart.  I have made referrals, counseling, and provided education to the patient based on review of the above and I have provided the patient with a written personalized care plan for preventive services.     Emeterio Reeve, DO   11/06/18   Visit summary with medication list and pertinent instructions was printed for patient to review. All questions at time of visit were answered - patient instructed to contact office with any additional concerns. ER/RTC precautions were reviewed with the patient. Follow-up plan: Return in about 1 year (around 11/07/2019) for [place], check-up with Dr A same day or earlier as needed .

## 2018-11-06 NOTE — Patient Instructions (Addendum)
General Preventive Care  Most recent routine screening lipids/other labs: ordered today. Please check with the lab with regard to cost of getting Vitamin D levels.   Everyone should have blood pressure checked once per year. Yours looks good.   Tobacco: continue to cut back! Please let me know if you need help quitting!  Alcohol: responsible moderation is ok for most adults - if you have concerns about your alcohol intake, please talk to me!   Exercise: as tolerated to reduce risk of cardiovascular disease and diabetes. Strength training will also prevent osteoporosis.   Mental health: if need for mental health care (medicines, counseling, other), or concerns about moods, please let me know!   Sexual health: if ever a need for STD testing, or if concerns with libido/pain problems, please let me know!   Advanced Directive: Living Will and/or Healthcare Power of Attorney recommended for all adults, regardless of age or health. See printed info.  Vaccines  Flu vaccine: recommended for almost everyone, every fall.   Shingles vaccine: Shingrix recommended. Medicare requires this be administered by the pharmacy and NOT the doctor's office - see printed Rx.   Pneumonia vaccines: you're up to date.  Tetanus booster: Done 2007, nothing else on file. Tdap recommended every 10 years. Will call [facility] and see if this has been updated. If not, see printed Rx.  Cancer screenings   Colon cancer screening: recommended for everyone at age 77-75  Prostate cancer screening: PSA blood test around age 73-71 Infection screenings . HIV, Gonorrhea/Chlamydia: screening as needed . Hepatitis C: recommended for anyone born 1945-1965, not needed for you.  Other . Bone Density Test: recommended given your previous results of osteopenia in 2017. Would check with imaging to see about coverage for this.

## 2018-11-08 LAB — COMPLETE METABOLIC PANEL WITH GFR
AG Ratio: 1.6 (calc) (ref 1.0–2.5)
ALBUMIN MSPROF: 4.2 g/dL (ref 3.6–5.1)
ALT: 19 U/L (ref 9–46)
AST: 24 U/L (ref 10–35)
Alkaline phosphatase (APISO): 51 U/L (ref 40–115)
BUN / CREAT RATIO: 21 (calc) (ref 6–22)
BUN: 25 mg/dL (ref 7–25)
CALCIUM: 9.5 mg/dL (ref 8.6–10.3)
CO2: 29 mmol/L (ref 20–32)
CREATININE: 1.21 mg/dL — AB (ref 0.70–1.11)
Chloride: 101 mmol/L (ref 98–110)
GFR, EST NON AFRICAN AMERICAN: 55 mL/min/{1.73_m2} — AB (ref >=60)
GFR, Est African American: 64 mL/min/{1.73_m2} (ref >=60)
GLOBULIN: 2.7 g/dL (ref 1.9–3.7)
Glucose, Bld: 102 mg/dL — ABNORMAL HIGH (ref 65–99)
Potassium: 4.3 mmol/L (ref 3.5–5.3)
SODIUM: 141 mmol/L (ref 135–146)
TOTAL PROTEIN: 6.9 g/dL (ref 6.1–8.1)
Total Bilirubin: 0.9 mg/dL (ref 0.2–1.2)

## 2018-11-08 LAB — URINALYSIS, ROUTINE W REFLEX MICROSCOPIC
BILIRUBIN URINE: NEGATIVE
Bacteria, UA: NONE SEEN /HPF
GLUCOSE, UA: NEGATIVE
Hgb urine dipstick: NEGATIVE
Hyaline Cast: NONE SEEN /LPF
Ketones, ur: NEGATIVE
NITRITE: NEGATIVE
PH: 5.5 (ref 5.0–8.0)
SPECIFIC GRAVITY, URINE: 1.019 (ref 1.001–1.03)
Squamous Epithelial / LPF: NONE SEEN /HPF (ref <=5)

## 2018-11-08 LAB — LIPID PANEL
Cholesterol: 178 mg/dL (ref <200)
HDL: 86 mg/dL (ref >40)
LDL Cholesterol (Calc): 77 mg/dL (calc)
NON-HDL CHOLESTEROL (CALC): 92 mg/dL (ref <130)
Total CHOL/HDL Ratio: 2.1 (calc) (ref <5.0)
Triglycerides: 71 mg/dL (ref <150)

## 2018-11-08 LAB — VITAMIN D 25 HYDROXY (VIT D DEFICIENCY, FRACTURES): Vit D, 25-Hydroxy: 46 ng/mL (ref 30–100)

## 2018-11-08 LAB — HEMOGLOBIN A1C W/OUT EAG: Hgb A1c MFr Bld: 5.8 % of total Hgb — ABNORMAL HIGH (ref <5.7)

## 2018-11-08 LAB — CBC
HEMATOCRIT: 40.5 % (ref 38.5–50.0)
HEMOGLOBIN: 13.7 g/dL (ref 13.2–17.1)
MCH: 30.2 pg (ref 27.0–33.0)
MCHC: 33.8 g/dL (ref 32.0–36.0)
MCV: 89.2 fL (ref 80.0–100.0)
MPV: 10.2 fL (ref 7.5–12.5)
Platelets: 222 10*3/uL (ref 140–400)
RBC: 4.54 10*6/uL (ref 4.20–5.80)
RDW: 12.4 % (ref 11.0–15.0)
WBC: 5.3 10*3/uL (ref 3.8–10.8)

## 2018-11-08 LAB — TSH: TSH: 1.51 mIU/L (ref 0.40–4.50)

## 2018-11-08 LAB — TEST AUTHORIZATION

## 2019-02-25 ENCOUNTER — Encounter: Payer: Self-pay | Admitting: Osteopathic Medicine

## 2019-05-27 ENCOUNTER — Other Ambulatory Visit: Payer: Self-pay

## 2019-06-08 ENCOUNTER — Other Ambulatory Visit: Payer: Self-pay | Admitting: Osteopathic Medicine

## 2019-06-08 NOTE — Telephone Encounter (Signed)
Please advise 

## 2019-07-03 ENCOUNTER — Ambulatory Visit (INDEPENDENT_AMBULATORY_CARE_PROVIDER_SITE_OTHER): Payer: Medicare Other | Admitting: Osteopathic Medicine

## 2019-07-03 DIAGNOSIS — Z23 Encounter for immunization: Secondary | ICD-10-CM | POA: Diagnosis not present

## 2019-07-03 NOTE — Progress Notes (Signed)
Pt here for flu shot. Afebrile,no recent illness. Vaccination given, pt tolerated well..Suhey Radford Lynetta, CMA  

## 2019-07-24 ENCOUNTER — Other Ambulatory Visit: Payer: Self-pay

## 2019-07-24 ENCOUNTER — Emergency Department (INDEPENDENT_AMBULATORY_CARE_PROVIDER_SITE_OTHER)
Admission: EM | Admit: 2019-07-24 | Discharge: 2019-07-24 | Disposition: A | Discharge status: Home / Self Care | Payer: Medicare Other | Source: Home / Self Care

## 2019-07-24 ENCOUNTER — Encounter: Payer: Self-pay | Admitting: *Deleted

## 2019-07-24 DIAGNOSIS — H6123 Impacted cerumen, bilateral: Secondary | ICD-10-CM

## 2019-07-24 NOTE — ED Provider Notes (Signed)
Tim[facility]    CSN: [id number] Arrival date & time: 07/24/19  1325      History   Chief Complaint Chief Complaint  Patient presents with  . Cerumen Impaction    HPI Timothy Waller is a 85 y.o. male.   Is an 85 year old [place] established urgent care patient who presents with fullness in his right ear for 2 days.  He has a history of earwax impaction and has to be disimpacted about once every year.  He has had no dizziness or loss of hearing.  He denies ear pain but he has had problems with a fungus in his left ear since he was in the [employer] and had several surgeries to repair the damaged tympanic membrane.     Past Medical History:  Diagnosis Date  . Abnormal weight loss   . COPD (chronic obstructive pulmonary disease) ([place])   . Hyperlipidemia   . Osteoporosis 09/01/2010   Qualifier: Diagnosis of  By: Renae Fickle CMA, Deborra Medina), Seth Bake      Patient Active Problem List   Diagnosis Date Noted  . Proteinuria 01/31/2018  . Frequent urination 01/31/2018  . Orthostasis 06/12/2017  . Heavy tobacco smoker 06/12/2017  . Benign colonic polyp 11/02/2016  . Needs flu shot 07/08/2016  . Osteopenia 02/17/2016  . Essential hypertension 02/09/2016  . Special screening for malignant neoplasms, colon 03/05/2015  . Hyperlipidemia 12/16/2014  . COPD with emphysema ([place]) 12/15/2014  . SHOULDER PAIN 08/24/2010  . HEMATURIA UNSPECIFIED 06/11/2009  . ERECTILE DYSFUNCTION, ORGANIC 06/11/2009  . SEBORRHEIC KERATOSIS 06/11/2009  . LUMBAGO 06/11/2009    Past Surgical History:  Procedure Laterality Date  . COLONOSCOPY    . INNER EAR SURGERY     hole in eardrum 30 years ago       Home Medications    Prior to Admission medications   Medication Sig Start Date End Date Taking? Authorizing Provider  Ascorbic Acid (VITAMIN C) 1000 MG tablet Take 1,000 mg by mouth daily.    [provider]  atorvastatin (LIPITOR) 10 MG tablet TAKE 1 TABLET BY MOUTH EVERY DAY  06/10/19   Emeterio Reeve, DO  COD LIVER OIL PO Take by mouth.    [provider]  Multiple Vitamins-Minerals (CENTRUM PO) Take by mouth.    [provider]  Omega-3 Fatty Acids (FISH OIL) 1000 MG CAPS Take by mouth.    [provider]    Family History Family History  Problem Relation Age of Onset  . Cancer Father        mouth cancer     Social History Social History   Tobacco Use  . Smoking status: Current Every Day Smoker    Packs/day: 0.50    Types: Cigarettes  . Smokeless tobacco: Never Used  Substance Use Topics  . Alcohol use: Yes    Alcohol/week: 0.0 standard drinks    Comment: 2-3 drinks per week   . Drug use: No     Allergies   Patient has no known allergies.   Review of Systems Review of Systems   Physical Exam Triage Vital Signs ED Triage Vitals [07/24/19 1339]  Enc Vitals Group     BP (!) 162/73     Pulse Rate 73     Resp 16     Temp 97.6 F (36.4 C)     Temp Source Oral     SpO2 96 %     Weight 118 lb (53.5 kg)     Height 5\' 9"  (  1.753 m)     Head Circumference      Peak Flow      Pain Score 0     Pain Loc      Pain Edu?      Excl. in [place]?    No data found.  Updated Vital Signs BP (!) 162/73 (BP Location: Right Arm)   Pulse 73   Temp 97.6 F (36.4 C) (Oral)   Resp 16   Ht 5\' 9"  (1.753 m)   Wt 53.5 kg   SpO2 96%   BMI 17.43 kg/m    Physical Exam Vitals signs reviewed.  Constitutional:      Appearance: Normal appearance. He is normal weight. He is not ill-appearing.  HENT:     Head: Normocephalic.     Right Ear: Tympanic membrane, ear canal and external ear normal.     Left Ear: Tympanic membrane, ear canal and external ear normal.  Neck:     Musculoskeletal: Normal range of motion and neck supple.  Cardiovascular:     Pulses: Normal pulses.  Pulmonary:     Effort: Pulmonary effort is normal.  Musculoskeletal: Normal range of motion.  Skin:    General: Skin is warm and dry.  Neurological:      General: No focal deficit present.     Mental Status: He is alert and oriented to person, place, and time.  Psychiatric:        Mood and Affect: Mood normal.      UC Treatments / Results  Labs (all labs ordered are listed, but only abnormal results are displayed) Labs Reviewed - No data to display  EKG   Radiology No results found.  Procedures Procedures (including critical care time)  Medications Ordered in UC Medications - No data to display  Initial Impression / Assessment and Plan / UC Course  I have reviewed the triage vital signs and the nursing notes.  Pertinent labs & imaging results that were available during my care of the patient were reviewed by me and considered in my medical decision making (see chart for details).    Final Clinical Impressions(s) / UC Diagnoses   Final diagnoses:  Hearing loss due to cerumen impaction, bilateral     Discharge Instructions     Ears are now clear with normal tympanic membranes    ED Prescriptions    None     PDMP not reviewed this encounter.   Robyn Haber, MD 07/24/19 [phone]

## 2019-07-24 NOTE — Discharge Instructions (Addendum)
Ears are now clear with normal tympanic membranes

## 2019-07-24 NOTE — ED Triage Notes (Signed)
Pt c/o RT ear fullness x 2 days.

## 2019-08-05 ENCOUNTER — Encounter: Payer: Self-pay | Admitting: Osteopathic Medicine

## 2019-08-05 ENCOUNTER — Ambulatory Visit (INDEPENDENT_AMBULATORY_CARE_PROVIDER_SITE_OTHER): Payer: Medicare Other | Admitting: Osteopathic Medicine

## 2019-08-05 ENCOUNTER — Other Ambulatory Visit: Payer: Self-pay

## 2019-08-05 VITALS — BP 139/71 | HR 72 | Temp 98.0°F | Wt 120.0 lb

## 2019-08-05 DIAGNOSIS — D649 Anemia, unspecified: Secondary | ICD-10-CM

## 2019-08-05 DIAGNOSIS — R5383 Other fatigue: Secondary | ICD-10-CM

## 2019-08-05 DIAGNOSIS — R42 Dizziness and giddiness: Secondary | ICD-10-CM | POA: Diagnosis not present

## 2019-08-05 MED ORDER — MECLIZINE HCL 25 MG PO TABS: 25.0000 mg | ORAL_TABLET | Freq: Three times a day (TID) | ORAL | 0 refills | Status: DC | PRN

## 2019-08-05 NOTE — Progress Notes (Signed)
HPI: Timothy Waller is a 85 y.o. male who  has a past medical history of Abnormal weight loss, COPD (chronic obstructive pulmonary disease) ([place]), Hyperlipidemia, and Osteoporosis (09/01/2010).  he presents to [facility] today, 08/05/19,  for chief complaint of: lightheadedness   . Quality: more so lightheaded than spinning, Yesterday patient felt "out of it". Has episodes in the morning where he feels unsteady on his feet. 1 episode of the room spinning when laying down in bed.  . Duration: over the past few weeks. Have become more noticeable in the past couple of days . Timing: Not every day but can occur anytime throughout the day  Happens when he leans forward then straightens up.  . Severity: Have not worsened but more noticeable. . Context: no injury, no recent illness  . Modifying factors:Worse when he lowers head down, not worse with any other head position changes.  . Assoc signs/symptoms: No new medications, No syncopal episodes, no falls, no n/v, no headache, no chest pain, no double vision, no vision loss, no chest pain, no SOB, no LE weakness   At today's visit 08/05/19 ... PMH, PSH, FH reviewed and updated as needed.  Current medication list and allergy/intolerance hx reviewed and updated as needed. (See remainder of HPI, ROS, Phys Exam below)   No results found.  No results found for this or any previous visit (from the past 72 hour(s)).  Orthostatic VS for the past 24 hrs:  BP- Lying Pulse- Lying BP- Sitting Pulse- Sitting BP- Standing at 0 minutes Pulse- Standing at 0 minutes  08/05/19 1459 145/70 69 149/67 63 131/66 76     EKG 08/05/19 - Rate: 68 - PR interval: 140 ms - QRS duration: 96 ms Interpretation: Normal Sinus Rhythm      ASSESSMENT/PLAN: The primary encounter diagnosis was Lightheadedness. A diagnosis of Other fatigue was also pertinent to this visit.   Timothy Waller is a 85 y.o. male being seen for two weeks  of lightheadedness. Given negative Dix-Hallpike maneuver on exam and no evidence of weakness or focal neurologic deficits, lightheadness not likely neurologic in etiology. Orthostatic vital signs performed revealed a drop in systolic BP of 18. EKG revealed a normal sinus rhythm. Will workup with CBC, CMP, TSH, UA, and Echocardiogram. Start meclizine to see if symptoms improve.   If no better / depending on results, may consider specialist referral  ER precautions reviewed   Orders Placed This Encounter  Procedures  . CBC with Differential/Platelet  . COMPLETE METABOLIC PANEL WITH GFR  . TSH  . Urinalysis, Routine w reflex microscopic  . EKG 12-Lead  . ECHOCARDIOGRAM COMPLETE  . VAS US CAROTID     Meds ordered this encounter  Medications  . meclizine (ANTIVERT) 25 MG tablet    Sig: Take 1 tablet (25 mg total) by mouth 3 (three) times daily as needed for dizziness.    Dispense:  30 tablet    Refill:  0    There are no Patient Instructions on file for this visit.    Follow-up plan: Return for RECHECK PENDING RESULTS / IF WORSE OR CHANGE.                                                 ################################################# ################################################# ################################################# #################################################    No outpatient medications have been marked  as taking for the 08/05/19 encounter (Appointment) with Emeterio Reeve, DO.    No Known Allergies     Review of Systems:  Constitutional: No recent illness  HEENT: No  headache, no vision change  Cardiac: No  chest pain  Respiratory:  No  shortness of breath. No  Cough  Neurologic: Dizziness per HPI. No  weakness  Psychiatric: No  concerns with depression, No  concerns with anxiety  Exam:  BP 139/71 (BP Location: Left Arm, Patient Position: Sitting, Cuff Size: Normal)   Pulse 72   Temp  98 F (36.7 C) (Oral)   Wt 120 lb 0.6 oz (54.4 kg)   BMI 17.73 kg/m   Constitutional: VS see above. General Appearance: alert, well-developed, well-nourished, NAD  Eyes: Normal lids and conjunctive, non-icteric sclera  Ears, Nose, Mouth, Throat: MMM, Normal external inspection ears/nares/mouth/lips/gums.  Neck: No masses, trachea midline. No palpable lymphadenopathy   Respiratory: Normal respiratory effort. no wheeze, no rhonchi, no rales  Cardiovascular: S1/S2 normal, no murmur, no rub/gallop auscultated. RRR. No carotid bruits.   Musculoskeletal: Gait normal. Symmetric and independent movement of all extremities  Neurological: Normal balance/coordination. No tremor. EOMs intact. Dix-Hallpike maneuver negative bilaterally. No nystagmus elicited.   Skin: warm, dry, intact.   Psychiatric: Normal judgment/insight. Normal mood and affect. Oriented x3.       Visit summary with medication list and pertinent instructions was printed for patient to review, patient was advised to alert [place] if any updates are needed. All questions at time of visit were answered - patient instructed to contact office with any additional concerns. ER/RTC precautions were reviewed with the patient and understanding verbalized.   Note: Total time spent 25 minutes, greater than 50% of the visit was spent face-to-face counseling and coordinating care for the following: The primary encounter diagnosis was Lightheadedness. A diagnosis of Other fatigue was also pertinent to this visit.Marland Kitchen  Please note: voice recognition software was used to produce this document, and typos may escape review. Please contact Dr. Sheppard Coil for any needed clarifications.    Follow up plan: Return for RECHECK PENDING RESULTS / IF WORSE OR CHANGE.

## 2019-08-06 LAB — COMPLETE METABOLIC PANEL WITH GFR
AG Ratio: 1.4 (calc) (ref 1.0–2.5)
ALT: 15 U/L (ref 9–46)
AST: 26 U/L (ref 10–35)
Albumin: 4.1 g/dL (ref 3.6–5.1)
Alkaline phosphatase (APISO): 53 U/L (ref 35–144)
BUN/Creatinine Ratio: 22 (calc) (ref 6–22)
BUN: 28 mg/dL — ABNORMAL HIGH (ref 7–25)
CO2: 26 mmol/L (ref 20–32)
Calcium: 9.9 mg/dL (ref 8.6–10.3)
Chloride: 102 mmol/L (ref 98–110)
Creat: 1.25 mg/dL — ABNORMAL HIGH (ref 0.70–1.11)
GFR, Est African American: 62 mL/min/{1.73_m2} (ref >=60)
GFR, Est Non African American: 53 mL/min/{1.73_m2} — ABNORMAL LOW (ref >=60)
Globulin: 3 g/dL (calc) (ref 1.9–3.7)
Glucose, Bld: 110 mg/dL (ref 65–139)
Potassium: 4.9 mmol/L (ref 3.5–5.3)
Sodium: 138 mmol/L (ref 135–146)
Total Bilirubin: 0.6 mg/dL (ref 0.2–1.2)
Total Protein: 7.1 g/dL (ref 6.1–8.1)

## 2019-08-06 LAB — CBC WITH DIFFERENTIAL/PLATELET
Absolute Monocytes: 718 cells/uL (ref 200–950)
Basophils Absolute: 47 cells/uL (ref 0–200)
Basophils Relative: 0.6 %
Eosinophils Absolute: 187 cells/uL (ref 15–500)
Eosinophils Relative: 2.4 %
HCT: 37 % — ABNORMAL LOW (ref 38.5–50.0)
Hemoglobin: 12.6 g/dL — ABNORMAL LOW (ref 13.2–17.1)
Lymphs Abs: 2239 cells/uL (ref 850–3900)
MCH: 30.4 pg (ref 27.0–33.0)
MCHC: 34.1 g/dL (ref 32.0–36.0)
MCV: 89.2 fL (ref 80.0–100.0)
MPV: 10.8 fL (ref 7.5–12.5)
Monocytes Relative: 9.2 %
Neutro Abs: 4610 cells/uL (ref 1500–7800)
Neutrophils Relative %: 59.1 %
Platelets: 226 10*3/uL (ref 140–400)
RBC: 4.15 10*6/uL — ABNORMAL LOW (ref 4.20–5.80)
RDW: 12.2 % (ref 11.0–15.0)
Total Lymphocyte: 28.7 %
WBC: 7.8 10*3/uL (ref 3.8–10.8)

## 2019-08-06 LAB — URINALYSIS, ROUTINE W REFLEX MICROSCOPIC
Bacteria, UA: NONE SEEN /HPF
Bilirubin Urine: NEGATIVE
Glucose, UA: NEGATIVE
Hgb urine dipstick: NEGATIVE
Hyaline Cast: NONE SEEN /LPF
Ketones, ur: NEGATIVE
Leukocytes,Ua: NEGATIVE
Nitrite: NEGATIVE
Specific Gravity, Urine: 1.018 (ref 1.001–1.03)
Squamous Epithelial / HPF: NONE SEEN /HPF (ref <=5)
WBC, UA: NONE SEEN /HPF (ref 0–5)
pH: <=5 (ref 5.0–8.0)

## 2019-08-06 LAB — TSH: TSH: 1.09 mIU/L (ref 0.40–4.50)

## 2019-08-06 NOTE — Addendum Note (Signed)
Addended by: Maryla Morrow on: 08/06/2019 07:14 AM   Modules accepted: Orders

## 2019-08-08 ENCOUNTER — Ambulatory Visit (HOSPITAL_COMMUNITY)
Admission: RE | Admit: 2019-08-08 | Discharge: 2019-08-08 | Disposition: A | Discharge status: Home / Self Care | Payer: Medicare Other | Source: Ambulatory Visit | Attending: Osteopathic Medicine | Admitting: Osteopathic Medicine

## 2019-08-08 ENCOUNTER — Ambulatory Visit (HOSPITAL_BASED_OUTPATIENT_CLINIC_OR_DEPARTMENT_OTHER)
Admission: RE | Admit: 2019-08-08 | Discharge: 2019-08-08 | Disposition: A | Discharge status: Home / Self Care | Payer: Medicare Other | Source: Ambulatory Visit | Attending: Osteopathic Medicine | Admitting: Osteopathic Medicine

## 2019-08-08 ENCOUNTER — Other Ambulatory Visit: Payer: Self-pay

## 2019-08-08 DIAGNOSIS — E785 Hyperlipidemia, unspecified: Secondary | ICD-10-CM | POA: Insufficient documentation

## 2019-08-08 DIAGNOSIS — I77810 Thoracic aortic ectasia: Secondary | ICD-10-CM | POA: Insufficient documentation

## 2019-08-08 DIAGNOSIS — R42 Dizziness and giddiness: Secondary | ICD-10-CM | POA: Insufficient documentation

## 2019-08-08 DIAGNOSIS — J449 Chronic obstructive pulmonary disease, unspecified: Secondary | ICD-10-CM | POA: Diagnosis not present

## 2019-08-08 DIAGNOSIS — F17200 Nicotine dependence, unspecified, uncomplicated: Secondary | ICD-10-CM | POA: Insufficient documentation

## 2019-08-08 DIAGNOSIS — I517 Cardiomegaly: Secondary | ICD-10-CM | POA: Insufficient documentation

## 2019-08-08 NOTE — Progress Notes (Signed)
  Echocardiogram 2D Echocardiogram has been performed.  Jannett Celestine 08/08/2019, 9:47 AM

## 2019-09-05 ENCOUNTER — Ambulatory Visit: Payer: Medicare Other | Admitting: Family Medicine

## 2019-10-28 ENCOUNTER — Telehealth: Payer: Self-pay

## 2019-10-28 NOTE — Telephone Encounter (Signed)
FYI: Pt's daughter Lelon Frohlich (on HIPPA/ROI form) called stating pt has a hx of dizziness and has been getting progressively worse over the last 3-4 days. She is requesting a heart monitor for him. Informed her that he really needs to be seen to make sure we are approaching this from all angels. She was agreeable to bringing him in tomorrow morning at 9:10 for an office visit with Samuel Bouche, NP

## 2019-10-29 ENCOUNTER — Encounter: Payer: Self-pay | Admitting: Medical-Surgical

## 2019-10-29 ENCOUNTER — Ambulatory Visit (INDEPENDENT_AMBULATORY_CARE_PROVIDER_SITE_OTHER): Payer: Medicare Other | Admitting: Medical-Surgical

## 2019-10-29 ENCOUNTER — Other Ambulatory Visit: Payer: Self-pay

## 2019-10-29 VITALS — BP 134/65 | HR 69 | Temp 97.5°F | Ht 69.0 in | Wt 118.0 lb

## 2019-10-29 DIAGNOSIS — L989 Disorder of the skin and subcutaneous tissue, unspecified: Secondary | ICD-10-CM | POA: Insufficient documentation

## 2019-10-29 DIAGNOSIS — R42 Dizziness and giddiness: Secondary | ICD-10-CM | POA: Diagnosis not present

## 2019-10-29 DIAGNOSIS — R238 Other skin changes: Secondary | ICD-10-CM

## 2019-10-29 LAB — CBC
HCT: 39.7 % (ref 38.5–50.0)
Hemoglobin: 13.2 g/dL (ref 13.2–17.1)
MCH: 29.9 pg (ref 27.0–33.0)
MCHC: 33.2 g/dL (ref 32.0–36.0)
MCV: 89.8 fL (ref 80.0–100.0)
MPV: 10.6 fL (ref 7.5–12.5)
Platelets: 222 10*3/uL (ref 140–400)
RBC: 4.42 10*6/uL (ref 4.20–5.80)
RDW: 12.4 % (ref 11.0–15.0)
WBC: 5.5 10*3/uL (ref 3.8–10.8)

## 2019-10-29 NOTE — Progress Notes (Signed)
Subjective:    CC: Lightheadedness  HPI:  Pleasant 86 year old male presenting today with complaints of mild, intermittent lightheadedness x5 days.  Reports this is the same feeling as he had in October but not quite as severe.  Previous symptoms resolved spontaneously without treatment.  Episodes happen in the daytime only and most often with position changes that involve bending over/straightening up.  Reports episodes are worse in the morning. Denies dizziness, nausea, vomiting, chest pain, shortness of breath, palpitations, irregular heartbeat.  No changes in hearing, ear pain, ringing in the ears. Does not interfere with walking or driving.  No falls.  Previously prescribed meclizine but reports he never took it.  Has not tried it with these new recurrence of symptoms.  Blood pressure checked at home yesterday 144/75.  Patient asking about removal of small growth on left forehead near the hairline. Growth present for several weeks, gradually increasing in size then fell off spontaneously. Two weeks ago, growth noticed to be returning and has increased in size. No drainage, redness, or inflammation. Not painful.  I reviewed the past medical history, family history, social history, surgical history, and allergies today and no changes were needed.  Please see the problem list section below in epic for further details.  Past Medical History: Past Medical History:  Diagnosis Date  . Abnormal weight loss   . COPD (chronic obstructive pulmonary disease) ([place])   . Hyperlipidemia   . Osteoporosis 09/01/2010   Qualifier: Diagnosis of  By: Renae Fickle CMA, (AAMA), Seth Bake     Past Surgical History: Past Surgical History:  Procedure Laterality Date  . COLONOSCOPY    . INNER EAR SURGERY     hole in eardrum 30 years ago   Social History: Social History   Socioeconomic History  . Marital status: Married    Spouse name: Not on file  . Number of children: Not on file  . Years of education: Not  on file  . Highest education level: Not on file  Occupational History  . Not on file  Tobacco Use  . Smoking status: Current Every Day Smoker    Packs/day: 0.50    Types: Cigarettes  . Smokeless tobacco: Never Used  Substance and Sexual Activity  . Alcohol use: Yes    Alcohol/week: 0.0 standard drinks    Comment: 2-3 drinks per week   . Drug use: No  . Sexual activity: Not Currently  Other Topics Concern  . Not on file  Social History Narrative  . Not on file   Social Determinants of Health   Financial Resource Strain:   . Difficulty of Paying Living Expenses: Not on file  Food Insecurity:   . Worried About Charity fundraiser in the Last Year: Not on file  . Ran Out of Food in the Last Year: Not on file  Transportation Needs:   . Lack of Transportation (Medical): Not on file  . Lack of Transportation (Non-Medical): Not on file  Physical Activity:   . Days of Exercise per Week: Not on file  . Minutes of Exercise per Session: Not on file  Stress:   . Feeling of Stress : Not on file  Social Connections:   . Frequency of Communication with Friends and Family: Not on file  . Frequency of Social Gatherings with Friends and Family: Not on file  . Attends Religious Services: Not on file  . Active Member of Clubs or Organizations: Not on file  . Attends Archivist Meetings: Not on  file  . Marital Status: Not on file   Family History: Family History  Problem Relation Age of Onset  . Cancer Father        mouth cancer    Allergies: No Known Allergies Medications: See med rec.  Review of Systems: No fevers, chills, night sweats, weight loss, chest pain, or shortness of breath.   Objective:    General: Well Developed, well nourished, and in no acute distress.  Neuro: Alert and oriented x3, extra-ocular muscles intact, sensation grossly intact.  Grips equal bilaterally.  Muscle strength 5 of 5 in upper and lower extremities.  Romberg negative.  Dix-Hallpike  negative for nystagmus but did elicit slight lightheadedness to the right. HEENT: Normocephalic, atraumatic, pupils equal round reactive to light, neck supple, no masses, no lymphadenopathy, thyroid nonpalpable.  Bilateral external ear canals patent, TMs normal. Skin: Warm and dry, no rashes. 0.5cm round x .25cm deep cutaneous horn on left side of forehead near hairline.  Cardiac: Regular rate and rhythm, no murmurs rubs or gallops, no lower extremity edema.  Respiratory: Scattered wheezes right upper lobe and bilateral lower lobes. Not using accessory muscles, speaking in full sentences.   Impression and Recommendations:    Lightheadedness Hemoglobin 12.6 at last check.  Recheck CBC today.  Encouraged adequate hydration.  Instructed to check blood pressure during lightheaded episodes at home and keep a journal of this.  Patient agreed to send readings via MyChart for evaluation.  Discussed Zio patch monitoring but patient declined at this time.    Return in about 2 weeks (around 11/12/2019) for evaluation of lesion removal site, lightheadedness follow-up.  ___________________________________________ Clearnce Sorrel, DNP, APRN, FNP-BC [facility] and [place]

## 2019-10-29 NOTE — Progress Notes (Signed)
    Procedures performed today:    Lesion was removed bluntly, base was hyfrecated.  Independent interpretation of tests performed by another provider:   None.  Impression and Recommendations:    Skin lesion of face Appears to be a cutaneous horn. Removed, hyfrecation to base, we will send this off for dermatopathology.     ___________________________________________ Gwen Her. Dianah Field, M.D., ABFM., CAQSM. Primary Care and [place] Instructor of [place] [facility]

## 2019-10-29 NOTE — Assessment & Plan Note (Signed)
Hemoglobin 12.6 at last check.  Recheck CBC today.  Encouraged adequate hydration.  Instructed to check blood pressure during lightheaded episodes at home and keep a journal of this.  Patient agreed to send readings via MyChart for evaluation.  Discussed Zio patch monitoring but patient declined at this time.

## 2019-10-29 NOTE — Assessment & Plan Note (Signed)
Appears to be a cutaneous horn. Removed, hyfrecation to base, we will send this off for dermatopathology.

## 2019-10-29 NOTE — Patient Instructions (Signed)
Although you are not feeling dizzy, this information is also useful for managing lightheadedness.   Dizziness Dizziness is a common problem. It makes you feel unsteady or light-headed. You may feel like you are about to pass out (faint). Dizziness can lead to getting hurt if you stumble or fall. Dizziness can be caused by many things, including:  Medicines.  Not having enough water in your body (dehydration).  Illness. Follow these instructions at home: Eating and drinking   Drink enough fluid to keep your pee (urine) clear or pale yellow. This helps to keep you from getting dehydrated. Try to drink more clear fluids, such as water.  Do not drink alcohol.  Limit how much caffeine you drink or eat, if your doctor tells you to do that.  Limit how much salt (sodium) you drink or eat, if your doctor tells you to do that. Activity   Avoid making quick movements. ? When you stand up from sitting in a chair, steady yourself until you feel okay. ? In the morning, first sit up on the side of the bed. When you feel okay, stand slowly while you hold onto something. Do this until you know that your balance is fine.  If you need to stand in one place for a long time, move your legs often. Tighten and relax the muscles in your legs while you are standing.  Do not drive or use heavy machinery if you feel dizzy.  Avoid bending down if you feel dizzy. Place items in your home so you can reach them easily without leaning over. Lifestyle  Do not use any products that contain nicotine or tobacco, such as cigarettes and e-cigarettes. If you need help quitting, ask your doctor.  Try to lower your stress level. You can do this by using methods such as yoga or meditation. Talk with your doctor if you need help. General instructions  Watch your dizziness for any changes.  Take over-the-counter and prescription medicines only as told by your doctor. Talk with your doctor if you think that you are  dizzy because of a medicine that you are taking.  Tell a friend or a family member that you are feeling dizzy. If he or she notices any changes in your behavior, have this person call your doctor.  Keep all follow-up visits as told by your doctor. This is important. Contact a doctor if:  Your dizziness does not go away.  Your dizziness or light-headedness gets worse.  You feel sick to your stomach (nauseous).  You have trouble hearing.  You have new symptoms.  You are unsteady on your feet.  You feel like the room is spinning. Get help right away if:  You throw up (vomit) or have watery poop (diarrhea), and you cannot eat or drink anything.  You have trouble: ? Talking. ? Walking. ? Swallowing. ? Using your arms, hands, or legs.  You feel generally weak.  You are not thinking clearly, or you have trouble forming sentences. A friend or family member may notice this.  You have: ? Chest pain. ? Pain in your belly (abdomen). ? Shortness of breath. ? Sweating.  Your vision changes.  You are bleeding.  You have a very bad headache.  You have neck pain or a stiff neck.  You have a fever. These symptoms may be an emergency. Do not wait to see if the symptoms will go away. Get medical help right away. Call your local emergency services (911 in the [place] Do  not drive yourself to the hospital. Summary  Dizziness makes you feel unsteady or light-headed. You may feel like you are about to pass out (faint).  Drink enough fluid to keep your pee (urine) clear or pale yellow. Do not drink alcohol.  Avoid making quick movements if you feel dizzy.  Watch your dizziness for any changes. This information is not intended to replace advice given to you by your health care provider. Make sure you discuss any questions you have with your health care provider. Document Revised: 10/13/2017 Document Reviewed: 10/27/2016 Elsevier Patient Education  [place].

## 2019-11-08 ENCOUNTER — Ambulatory Visit: Payer: Medicare Other

## 2019-11-08 ENCOUNTER — Ambulatory Visit: Payer: Medicare Other | Admitting: Medical-Surgical

## 2019-11-08 ENCOUNTER — Ambulatory Visit: Payer: Medicare Other | Attending: Internal Medicine

## 2019-11-08 DIAGNOSIS — Z23 Encounter for immunization: Secondary | ICD-10-CM | POA: Insufficient documentation

## 2019-11-08 NOTE — Progress Notes (Signed)
   Covid-19 Vaccination Clinic  Name:  Timothy Waller    MRN: [id number] DOB: [birth year]  11/08/2019  [name] was observed post Covid-19 immunization for 15 minutes without incidence. He was provided with Vaccine Information Sheet and instruction to access the V-Safe system.   Timothy Waller was instructed to call 911 with any severe reactions post vaccine: Marland Kitchen Difficulty breathing  . Swelling of your face and throat  . A fast heartbeat  . A bad rash all over your body  . Dizziness and weakness    Immunizations Administered    Name Date Dose VIS Date Route   Pfizer COVID-19 Vaccine 11/08/2019  9:54 AM 0.3 mL 10/04/2019 Intramuscular   Manufacturer: [place]   Lot: [id number]   [place]: [id number]

## 2019-11-29 ENCOUNTER — Ambulatory Visit: Payer: Medicare Other | Attending: Internal Medicine

## 2019-11-29 DIAGNOSIS — Z23 Encounter for immunization: Secondary | ICD-10-CM | POA: Insufficient documentation

## 2019-11-29 NOTE — Progress Notes (Signed)
   Covid-19 Vaccination Clinic  Name:  Timothy Waller    MRN: [id number] DOB: [birth year]  11/29/2019  Timothy Waller was observed post Covid-19 immunization for 15 minutes without incidence. He was provided with Vaccine Information Sheet and instruction to access the V-Safe system.   Timothy Waller was instructed to call 911 with any severe reactions post vaccine: Marland Kitchen Difficulty breathing  . Swelling of your face and throat  . A fast heartbeat  . A bad rash all over your body  . Dizziness and weakness    Immunizations Administered    Name Date Dose VIS Date Route   Pfizer COVID-19 Vaccine 11/29/2019  9:32 AM 0.3 mL 10/04/2019 Intramuscular   Manufacturer: [place]   Lot: [id number]   [place]: [id number]

## 2020-06-13 ENCOUNTER — Other Ambulatory Visit: Payer: Self-pay | Admitting: Osteopathic Medicine

## 2020-07-13 ENCOUNTER — Ambulatory Visit (INDEPENDENT_AMBULATORY_CARE_PROVIDER_SITE_OTHER): Payer: Medicare Other | Admitting: Osteopathic Medicine

## 2020-07-13 VITALS — Temp 98.2°F

## 2020-07-13 DIAGNOSIS — Z23 Encounter for immunization: Secondary | ICD-10-CM

## 2020-07-13 NOTE — Addendum Note (Signed)
Addended by: Teddy Spike on: 07/13/2020 11:32 AM   Modules accepted: Level of Service

## 2020-07-13 NOTE — Progress Notes (Signed)
Pt here for flu shot. Afebrile,no recent illness. Vaccination given, pt tolerated well.  

## 2020-08-25 ENCOUNTER — Ambulatory Visit (INDEPENDENT_AMBULATORY_CARE_PROVIDER_SITE_OTHER): Payer: Medicare Other | Admitting: Osteopathic Medicine

## 2020-08-25 ENCOUNTER — Encounter: Payer: Self-pay | Admitting: Osteopathic Medicine

## 2020-08-25 VITALS — BP 155/77 | HR 60 | Temp 97.8°F | Wt 116.1 lb

## 2020-08-25 DIAGNOSIS — M8580 Other specified disorders of bone density and structure, unspecified site: Secondary | ICD-10-CM

## 2020-08-25 DIAGNOSIS — J439 Emphysema, unspecified: Secondary | ICD-10-CM

## 2020-08-25 DIAGNOSIS — N401 Enlarged prostate with lower urinary tract symptoms: Secondary | ICD-10-CM

## 2020-08-25 DIAGNOSIS — D649 Anemia, unspecified: Secondary | ICD-10-CM | POA: Diagnosis not present

## 2020-08-25 DIAGNOSIS — R809 Proteinuria, unspecified: Secondary | ICD-10-CM | POA: Diagnosis not present

## 2020-08-25 DIAGNOSIS — R5383 Other fatigue: Secondary | ICD-10-CM

## 2020-08-25 MED ORDER — ANORO ELLIPTA 62.5-25 MCG/INH IN AEPB: 1.0000 | INHALATION_SPRAY | Freq: Every day | RESPIRATORY_TRACT | 11 refills | Status: DC

## 2020-08-25 NOTE — Progress Notes (Signed)
HPI: Timothy Waller is a 86 y.o. male who  has a past medical history of Abnormal weight loss, COPD (chronic obstructive pulmonary disease) ([place]), Hyperlipidemia, and Osteoporosis (09/01/2010).  he presents to [facility] today, 08/25/20,  for chief complaint of:  Cough  Difficulty gaining weight Increased effects from alcohol   Patient complains of cough that occurs after smoking and lasts for a couple of minutes. It does not occur any other time. Occasionally productive for clear sputum. Denies shortness of breath, difficulty with activities of daily living, chest pain.    Patient reports concerns about weight. Has lost 4 lbs in last year. Reports diet of toast and coffee for breakfast, snacks throughout day, and one meal in evening. Denies early satiety, has not tried to gain or lose weight. Denies heat/cold intolerance. More frequent urination at night    Patient reports increased effects from alcohol. Reports feeling mild effects from one beer or glass of wine. Drinks a couple of glasses a week.      Past medical, surgical, social and family history reviewed:  Patient Active Problem List   Diagnosis Date Noted  . Benign localized prostatic hyperplasia with lower urinary tract symptoms (LUTS) 08/25/2020  . Skin lesion of face 10/29/2019  . Lightheadedness 10/29/2019  . Proteinuria 01/31/2018  . Frequent urination 01/31/2018  . Orthostasis 06/12/2017  . Heavy tobacco smoker 06/12/2017  . Benign colonic polyp 11/02/2016  . Needs flu shot 07/08/2016  . Osteopenia 02/17/2016  . Essential hypertension 02/09/2016  . Special screening for malignant neoplasms, colon 03/05/2015  . Hyperlipidemia 12/16/2014  . COPD with emphysema ([place]) 12/15/2014  . SHOULDER PAIN 08/24/2010  . HEMATURIA UNSPECIFIED 06/11/2009  . ERECTILE DYSFUNCTION, ORGANIC 06/11/2009  . SEBORRHEIC KERATOSIS 06/11/2009  . LUMBAGO 06/11/2009    Past Surgical History:   Procedure Laterality Date  . COLONOSCOPY    . INNER EAR SURGERY     hole in eardrum 30 years ago    Social History   Tobacco Use  . Smoking status: Current Every Day Smoker    Packs/day: 0.50    Types: Cigarettes  . Smokeless tobacco: Never Used  Substance Use Topics  . Alcohol use: Yes    Alcohol/week: 0.0 standard drinks    Comment: 2-3 drinks per week     Family History  Problem Relation Age of Onset  . Cancer Father        mouth cancer      Current medication list and allergy/intolerance information reviewed:    Current Outpatient Medications  Medication Sig Dispense Refill  . Ascorbic Acid (VITAMIN C) 1000 MG tablet Take 1,000 mg by mouth daily.    Marland Kitchen atorvastatin (LIPITOR) 10 MG tablet TAKE 1 TABLET BY MOUTH EVERY DAY/labs for refills 90 tablet 0  . COD LIVER OIL PO Take by mouth.    . Multiple Vitamins-Minerals (CENTRUM PO) Take by mouth.    . Omega-3 Fatty Acids (FISH OIL) 1000 MG CAPS Take by mouth.    . umeclidinium-vilanterol (ANORO ELLIPTA) 62.5-25 MCG/INH AEPB Inhale 1 puff into the lungs daily. 60 each 11   No current facility-administered medications for this visit.    No Known Allergies    Review of Systems:  Constitutional: No recent illness, + difficulty gaining weight   Cardiac: No  chest pain, No palpitations  Respiratory:  No  shortness of breath. +  Cough, No difficulties with activities of daily living  Gastrointestinal: No  abdominal pain, No  nausea,  No  vomiting  Genitourinary: Increased nocturia   Endocrine: No cold intolerance,  No heat intolerance. No polydipsia/polyphagia, + polyuria    Exam:  BP (!) 155/77 (BP Location: Left Arm, Patient Position: Sitting, Cuff Size: Normal)   Pulse 60   Temp 97.8 F (36.6 C) (Oral)   Wt 116 lb 1.9 oz (52.7 kg)   BMI 17.15 kg/m   Wt Readings from Last 3 Encounters:  08/25/20 116 lb 1.9 oz (52.7 kg)  10/29/19 118 lb 0.6 oz (53.5 kg)  08/05/19 120 lb 0.6 oz (54.4 kg)      Constitutional: VS see above. General Appearance: alert, well-developed, well-nourished, NAD  Respiratory: Normal respiratory effort. + mild wheezes throughout, no rhonchi, no rales  Cardiovascular: S1/S2 normal, no murmur, no rub/gallop auscultated. RRR. No lower extremity edema. Radial pulses are equal and strong bilaterally  Gastrointestinal: Nontender, no masses. No hepatomegaly, no splenomegaly.   Skin: warm, dry, intact. .     No results found for this or any previous visit (from the past 72 hour(s)).  No results found.   ASSESSMENT/PLAN: The primary encounter diagnosis was Pulmonary emphysema, unspecified emphysema type ([place]). Diagnoses of Anemia, unspecified type, Other fatigue, Proteinuria, unspecified type, Osteopenia, unspecified location, and Benign localized prostatic hyperplasia with lower urinary tract symptoms (LUTS) were also pertinent to this visit.    Cough   Start on daily inhaler with anoro ellipta   counseled on smoking cessation   Consider RTC for spirometry depending how he is doing w/ Rx. Aged out of lung cancer screening w/ CT but would certainly consider XR/other imaging pending recheck    Alcohol intolerance  counseled on effects of low caloric intake and low weight on potential alcohol effects   Difficulty gaining weight  CBC, TSH, PSA, CMP  Recheck in 3 months and consider further labs and/or CT at that time if still losing weight     Orders Placed This Encounter  Procedures  . CBC  . COMPLETE METABOLIC PANEL WITH GFR  . TSH  . PSA, Total with Reflex to PSA, Free    Meds ordered this encounter  Medications  . umeclidinium-vilanterol (ANORO ELLIPTA) 62.5-25 MCG/INH AEPB    Sig: Inhale 1 puff into the lungs daily.    Dispense:  60 each    Refill:  11    Patient Instructions  Plan:  Cough:  Will trial inhaler for cough/COPD. Quitting smoking will also help symptoms and help you live longer! Let us know if the inhaler  isn't helping and we may consider lung function testing in the office.   Weight: I'm not medically worried about your weight. Most likely cause is loss of muscle mass, normal for your age group. Let's recheck weight in 3 months and if still going down, will look further into this with more labs and possible imaging/CT scan. Weight loss may also be why you are more sensitive to alcohol. Please limit drinking!             Visit summary with medication list and pertinent instructions was printed for patient to review. All questions at time of visit were answered - patient instructed to contact office with any additional concerns or updates. ER/RTC precautions were reviewed with the patient.       Follow-up plan: Return in about 3 months (around 11/25/2020) for follow up weight and COPD. See me sooner if needed .

## 2020-08-25 NOTE — Patient Instructions (Addendum)
Plan:  Cough:  Will trial inhaler for cough/COPD. Quitting smoking will also help symptoms and help you live longer! Let us know if the inhaler isn't helping and we may consider lung function testing in the office.   Weight: I'm not medically worried about your weight. Most likely cause is loss of muscle mass, normal for your age group. Let's recheck weight in 3 months and if still going down, will look further into this with more labs and possible imaging/CT scan. Weight loss may also be why you are more sensitive to alcohol. Please limit drinking!

## 2020-08-26 ENCOUNTER — Encounter: Payer: Self-pay | Admitting: Osteopathic Medicine

## 2020-08-26 DIAGNOSIS — R5383 Other fatigue: Secondary | ICD-10-CM | POA: Diagnosis not present

## 2020-08-26 DIAGNOSIS — J439 Emphysema, unspecified: Secondary | ICD-10-CM | POA: Diagnosis not present

## 2020-08-26 DIAGNOSIS — M8580 Other specified disorders of bone density and structure, unspecified site: Secondary | ICD-10-CM | POA: Diagnosis not present

## 2020-08-26 DIAGNOSIS — R809 Proteinuria, unspecified: Secondary | ICD-10-CM | POA: Diagnosis not present

## 2020-08-26 DIAGNOSIS — N401 Enlarged prostate with lower urinary tract symptoms: Secondary | ICD-10-CM | POA: Diagnosis not present

## 2020-08-26 DIAGNOSIS — D649 Anemia, unspecified: Secondary | ICD-10-CM | POA: Diagnosis not present

## 2020-08-27 LAB — CBC
HCT: 38.7 % (ref 38.5–50.0)
Hemoglobin: 13.2 g/dL (ref 13.2–17.1)
MCH: 30.6 pg (ref 27.0–33.0)
MCHC: 34.1 g/dL (ref 32.0–36.0)
MCV: 89.8 fL (ref 80.0–100.0)
MPV: 10 fL (ref 7.5–12.5)
Platelets: 262 10*3/uL (ref 140–400)
RBC: 4.31 10*6/uL (ref 4.20–5.80)
RDW: 12.5 % (ref 11.0–15.0)
WBC: 5.9 10*3/uL (ref 3.8–10.8)

## 2020-08-27 LAB — COMPLETE METABOLIC PANEL WITH GFR
AG Ratio: 1.5 (calc) (ref 1.0–2.5)
ALT: 15 U/L (ref 9–46)
AST: 22 U/L (ref 10–35)
Albumin: 4.3 g/dL (ref 3.6–5.1)
Alkaline phosphatase (APISO): 51 U/L (ref 35–144)
BUN/Creatinine Ratio: 24 (calc) — ABNORMAL HIGH (ref 6–22)
BUN: 28 mg/dL — ABNORMAL HIGH (ref 7–25)
CO2: 28 mmol/L (ref 20–32)
Calcium: 10.2 mg/dL (ref 8.6–10.3)
Chloride: 99 mmol/L (ref 98–110)
Creat: 1.17 mg/dL — ABNORMAL HIGH (ref 0.70–1.11)
GFR, Est African American: 66 mL/min/{1.73_m2} (ref >=60)
GFR, Est Non African American: 57 mL/min/{1.73_m2} — ABNORMAL LOW (ref >=60)
Globulin: 2.9 g/dL (calc) (ref 1.9–3.7)
Glucose, Bld: 97 mg/dL (ref 65–99)
Potassium: 4.5 mmol/L (ref 3.5–5.3)
Sodium: 138 mmol/L (ref 135–146)
Total Bilirubin: 0.7 mg/dL (ref 0.2–1.2)
Total Protein: 7.2 g/dL (ref 6.1–8.1)

## 2020-08-27 LAB — PSA, TOTAL WITH REFLEX TO PSA, FREE: PSA, Total: 2.3 ng/mL (ref <=4.0)

## 2020-08-27 LAB — TSH: TSH: 2.1 mIU/L (ref 0.40–4.50)

## 2020-08-28 ENCOUNTER — Telehealth: Payer: Self-pay

## 2020-08-28 MED ORDER — ALBUTEROL SULFATE HFA 108 (90 BASE) MCG/ACT IN AERS: 2.0000 | INHALATION_SPRAY | Freq: Four times a day (QID) | RESPIRATORY_TRACT | 11 refills | Status: DC | PRN

## 2020-08-28 MED ORDER — TIOTROPIUM BROMIDE MONOHYDRATE 18 MCG IN CAPS: 18.0000 ug | ORAL_CAPSULE | Freq: Every day | RESPIRATORY_TRACT | 3 refills | Status: DC

## 2020-08-28 NOTE — Telephone Encounter (Signed)
Spriva for maintenance / prevention  ALbuterol for as-needed shortness of breath

## 2020-08-28 NOTE — Telephone Encounter (Signed)
Pt's daughter called stating the Anoro rx is too expensive (even with  GoodRx card). Per pt's daughter - the pharmacist stated that patient can be on two inhalers to replace anoro:  Symbicort Spiriva Albuterol  Pls send in new rxs for pt. Thanks.

## 2020-08-28 NOTE — Telephone Encounter (Signed)
Pt has been updated regarding the two inhalers. Pt informed of directions for each rx. No other inquiries during the call.

## 2020-09-08 ENCOUNTER — Other Ambulatory Visit: Payer: Self-pay | Admitting: Osteopathic Medicine

## 2020-09-22 ENCOUNTER — Other Ambulatory Visit: Payer: Self-pay

## 2020-09-22 ENCOUNTER — Encounter: Payer: Self-pay | Admitting: Emergency Medicine

## 2020-09-22 ENCOUNTER — Emergency Department (INDEPENDENT_AMBULATORY_CARE_PROVIDER_SITE_OTHER)
Admission: EM | Admit: 2020-09-22 | Discharge: 2020-09-22 | Disposition: A | Discharge status: Home / Self Care | Payer: Medicare Other | Source: Home / Self Care

## 2020-09-22 DIAGNOSIS — H6123 Impacted cerumen, bilateral: Secondary | ICD-10-CM

## 2020-09-22 NOTE — ED Triage Notes (Signed)
"  cloggged ears" x 2 days  No OTC gtts Has been here 2 times prior for wax impaction- does not like to use gtts at home

## 2020-09-22 NOTE — ED Provider Notes (Signed)
____________________________________________  Time seen: Approximately 8:30 AM  I have reviewed the triage vital signs and the nursing notes.   HISTORY  Chief Complaint Cerumen Impaction   Historian Patient     HPI Timothy Waller is a 86 y.o. male with a history of cerumen impaction, presents to the urgent care with a sensation of ear fullness.  Patient states that he has to come to urgent care primary care once a year for ear irrigation.  He denies ear pain or pain along the neck.  No fever or chills at home.  He denies discharge from the bilateral ears.  He states that his hearing does seem muffled which he has experienced in the past when he needs ear irrigation.  No chest pain, chest tightness or abdominal pain.   Past Medical History:  Diagnosis Date  . Abnormal weight loss   . COPD (chronic obstructive pulmonary disease) ([place])   . Hyperlipidemia   . Osteoporosis 09/01/2010   Qualifier: Diagnosis of  By: McCrimmon CMA, (AAMA), Seth Bake       Immunizations up to date:  Yes.     Past Medical History:  Diagnosis Date  . Abnormal weight loss   . COPD (chronic obstructive pulmonary disease) ([place])   . Hyperlipidemia   . Osteoporosis 09/01/2010   Qualifier: Diagnosis of  By: Renae Fickle CMA, Deborra Medina), Seth Bake      Patient Active Problem List   Diagnosis Date Noted  . Benign localized prostatic hyperplasia with lower urinary tract symptoms (LUTS) 08/25/2020  . Skin lesion of face 10/29/2019  . Lightheadedness 10/29/2019  . Proteinuria 01/31/2018  . Frequent urination 01/31/2018  . Orthostasis 06/12/2017  . Heavy tobacco smoker 06/12/2017  . Benign colonic polyp 11/02/2016  . Needs flu shot 07/08/2016  . Osteopenia 02/17/2016  . Essential hypertension 02/09/2016  . Special screening for malignant neoplasms, colon 03/05/2015  . Hyperlipidemia 12/16/2014  . COPD with emphysema ([place]) 12/15/2014  . SHOULDER PAIN 08/24/2010  . HEMATURIA UNSPECIFIED 06/11/2009  .  ERECTILE DYSFUNCTION, ORGANIC 06/11/2009  . SEBORRHEIC KERATOSIS 06/11/2009  . LUMBAGO 06/11/2009    Past Surgical History:  Procedure Laterality Date  . COLONOSCOPY    . INNER EAR SURGERY     hole in eardrum 30 years ago    Prior to Admission medications   Medication Sig Start Date End Date Taking? Authorizing Provider  albuterol (VENTOLIN HFA) 108 (90 Base) MCG/ACT inhaler Inhale 2 puffs into the lungs every 6 (six) hours as needed for wheezing. 08/28/20  Yes Emeterio Reeve, DO  Ascorbic Acid (VITAMIN C) 1000 MG tablet Take 1,000 mg by mouth daily.   Yes [provider]  atorvastatin (LIPITOR) 10 MG tablet TAKE 1 TABLET BY MOUTH EVERY DAY/LABS FOR REFILLS 09/08/20  Yes Emeterio Reeve, DO  COD LIVER OIL PO Take by mouth.   Yes [provider]  Multiple Vitamins-Minerals (CENTRUM PO) Take by mouth.   Yes [provider]  Omega-3 Fatty Acids (FISH OIL) 1000 MG CAPS Take by mouth.   Yes [provider]  tiotropium (SPIRIVA) 18 MCG inhalation capsule Place 1 capsule (18 mcg total) into inhaler and inhale daily. FOR MAINTENANCE 08/28/20  Yes Emeterio Reeve, DO    Allergies Patient has no known allergies.  Family History  Problem Relation Age of Onset  . Cancer Father        mouth cancer     Social History Social History   Tobacco Use  . Smoking status: Current Every Day Smoker  Packs/day: 0.50    Types: Cigarettes  . Smokeless tobacco: Never Used  Vaping Use  . Vaping Use: Never used  Substance Use Topics  . Alcohol use: Yes    Alcohol/week: 0.0 standard drinks    Comment: 2-3 drinks per week   . Drug use: No     Review of Systems  Constitutional: No fever/chills Eyes:  No discharge ENT: Patient has ear fullness. Respiratory: no cough. No SOB/ use of accessory muscles to breath Gastrointestinal:   No nausea, no vomiting.  No diarrhea.  No constipation. Musculoskeletal: Negative for musculoskeletal pain. Skin:  Negative for rash, abrasions, lacerations, ecchymosis.    ____________________________________________   PHYSICAL EXAM:  VITAL SIGNS: ED Triage Vitals  Enc Vitals Group     BP 09/22/20 0819 (!) 163/81     Pulse Rate 09/22/20 0819 72     Resp 09/22/20 0819 17     Temp 09/22/20 0819 (!) 97.5 F (36.4 C)     Temp Source 09/22/20 0819 Oral     SpO2 09/22/20 0819 95 %     Weight 09/22/20 0822 116 lb (52.6 kg)     Height 09/22/20 0822 5\' 9"  (1.753 m)     Head Circumference --      Peak Flow --      Pain Score 09/22/20 0822 0     Pain Loc --      Pain Edu? --      Excl. in [place]? --      Constitutional: Alert and oriented. Well appearing and in no acute distress. Eyes: Conjunctivae are normal. PERRL. EOMI. Head: Atraumatic. ENT:      Ears: TM is occluded by cerumen bilaterally.  No tenderness with palpation of the tragus.      Nose: No congestion/rhinnorhea.      Mouth/Throat: Mucous membranes are moist.  Neck: No stridor.  No cervical spine tenderness to palpation.  Cardiovascular: Normal rate, regular rhythm. Normal S1 and S2.  Good peripheral circulation. Respiratory: Normal respiratory effort without tachypnea or retractions. Lungs CTAB. Good air entry to the bases with no decreased or absent breath sounds Skin:  Skin is warm, dry and intact. No rash noted. Psychiatric: Mood and affect are normal for age. Speech and behavior are normal.   ____________________________________________   LABS (all labs ordered are listed, but only abnormal results are displayed)  Labs Reviewed - No data to display ____________________________________________  EKG   ____________________________________________  RADIOLOGY   No results found.  ____________________________________________    PROCEDURES  Procedure(s) performed:     Procedures     Medications - No data to display   ____________________________________________   INITIAL IMPRESSION / ASSESSMENT AND  PLAN / ED COURSE  Pertinent labs & imaging results that were available during my care of the patient were reviewed by me and considered in my medical decision making (see chart for details).      Assessment and plan Cerumen impaction 86 year old male presents to the urgent care with a sensation of ear fullness.  Patient was hypertensive at triage but vital signs were otherwise reassuring.  He had evidence of cerumen impaction bilaterally.  Will perform ear irrigation bilaterally and will outpatient follow-up with primary care.  Return precautions were given to return with new or worsening symptoms.     ____________________________________________  FINAL CLINICAL IMPRESSION(S) / ED DIAGNOSES  Final diagnoses:  Bilateral impacted cerumen      NEW MEDICATIONS STARTED DURING THIS VISIT:  ED Discharge Orders  None          This chart was dictated using voice recognition software/Dragon. Despite best efforts to proofread, errors can occur which can change the meaning. Any change was purely unintentional.     Lannie Fields, [place] 09/22/20 [phone]

## 2020-09-28 ENCOUNTER — Encounter: Payer: Self-pay | Admitting: Osteopathic Medicine

## 2020-09-28 ENCOUNTER — Telehealth (INDEPENDENT_AMBULATORY_CARE_PROVIDER_SITE_OTHER): Payer: Medicare Other | Admitting: Osteopathic Medicine

## 2020-09-28 VITALS — BP 138/67 | HR 78 | Wt 114.0 lb

## 2020-09-28 DIAGNOSIS — T881XXA Other complications following immunization, not elsewhere classified, initial encounter: Secondary | ICD-10-CM

## 2020-09-28 NOTE — Progress Notes (Signed)
Telemedicine Visit via  Audio only - telephone (patient preference /  technical difficulty with MyChart video application)  I connected with Timothy Waller on 09/28/20 at 3:11 PM  by phone or  telemedicine application as noted above  I verified that I am speaking with or regarding  the correct patient using two identifiers.  Participants: Myself, Dr Emeterio Reeve DO Patient: Timothy Waller Patient proxy if applicable: none Other, if applicable: none  Patient is in separate location from myself  I am in office at [facility]    I discussed the limitations of evaluation and management  by telemedicine and the availability of in person appointments.  The participant(s) above expressed understanding and  agreed to proceed with this appointment via telemedicine.       History of Present Illness: Timothy Waller is a 86 y.o. male who would like to discuss feeling sick   7 days ago got booster for COVID 2 days later, started feeling weak, fatigued, chills at night,no fever.  Tylenol helps the chills.  Mild headache Feeling a bit better today but still fatigued   No SOB or chest pain Dry nagging cough - about same as usual (COPD) No sinus symptoms     Observations/Objective: BP 138/67   Pulse 78   Wt 114 lb (51.7 kg)   BMI 16.83 kg/m  BP Readings from Last 3 Encounters:  09/28/20 138/67  09/22/20 (!) 163/81  08/25/20 (!) 155/77   Exam: Normal Speech.  NAD  Lab and Radiology Results No results found for this or any previous visit (from the past 72 hour(s)). No results found.     Assessment and Plan: 86 y.o. male with The encounter diagnosis was Postimmunization reaction, initial encounter. Sounds like normal immune reaction, no concern for secondary issue at this time, especially since he is noting improvement today. I think ok to continue to monitor and seek care in-person if needed     Follow Up Instructions: Return if symptoms worsen or  fail to improve.    I discussed the assessment and treatment plan with the patient. The patient was provided an opportunity to ask questions and all were answered. The patient agreed with the plan and demonstrated an understanding of the instructions.   The patient was advised to call back or seek an in-person evaluation if any new concerns, if symptoms worsen or if the condition fails to improve as anticipated.  15 minutes of non-face-to-face time was provided during this encounter.      . . . . . . . . . . . . . Marland Kitchen                   Historical information moved to improve visibility of documentation.  Past Medical History:  Diagnosis Date  . Abnormal weight loss   . COPD (chronic obstructive pulmonary disease) ([place])   . Hyperlipidemia   . Osteoporosis 09/01/2010   Qualifier: Diagnosis of  By: Renae Fickle CMA, (AAMA), Seth Bake     Past Surgical History:  Procedure Laterality Date  . COLONOSCOPY    . INNER EAR SURGERY     hole in eardrum 30 years ago   Social History   Tobacco Use  . Smoking status: Current Every Day Smoker    Packs/day: 0.50    Types: Cigarettes  . Smokeless tobacco: Never Used  Substance Use Topics  . Alcohol use: Yes    Alcohol/week: 0.0 standard drinks    Comment: 2-3 drinks per  week    family history includes Cancer in his father.  Medications: Current Outpatient Medications  Medication Sig Dispense Refill  . albuterol (VENTOLIN HFA) 108 (90 Base) MCG/ACT inhaler Inhale 2 puffs into the lungs every 6 (six) hours as needed for wheezing. 2 each 11  . Ascorbic Acid (VITAMIN C) 1000 MG tablet Take 1,000 mg by mouth daily.    Marland Kitchen atorvastatin (LIPITOR) 10 MG tablet TAKE 1 TABLET BY MOUTH EVERY DAY/LABS FOR REFILLS 90 tablet 2  . COD LIVER OIL PO Take by mouth.    . Multiple Vitamins-Minerals (CENTRUM PO) Take by mouth.    . Omega-3 Fatty Acids (FISH OIL) 1000 MG CAPS Take by mouth.    . tiotropium (SPIRIVA) 18 MCG inhalation  capsule Place 1 capsule (18 mcg total) into inhaler and inhale daily. FOR MAINTENANCE 90 capsule 3   No current facility-administered medications for this visit.   No Known Allergies

## 2020-10-26 ENCOUNTER — Ambulatory Visit: Payer: Medicare Other | Admitting: Nurse Practitioner

## 2020-10-27 ENCOUNTER — Encounter: Payer: Self-pay | Admitting: Medical-Surgical

## 2020-10-27 ENCOUNTER — Ambulatory Visit (INDEPENDENT_AMBULATORY_CARE_PROVIDER_SITE_OTHER): Payer: Medicare Other | Admitting: Medical-Surgical

## 2020-10-27 ENCOUNTER — Other Ambulatory Visit: Payer: Self-pay

## 2020-10-27 VITALS — Ht 69.0 in

## 2020-10-27 DIAGNOSIS — R059 Cough, unspecified: Secondary | ICD-10-CM | POA: Diagnosis not present

## 2020-10-27 DIAGNOSIS — T881XXD Other complications following immunization, not elsewhere classified, subsequent encounter: Secondary | ICD-10-CM | POA: Diagnosis not present

## 2020-10-27 MED ORDER — BENZONATATE 200 MG PO CAPS: 200.0000 mg | ORAL_CAPSULE | Freq: Three times a day (TID) | ORAL | 0 refills | Status: DC | PRN

## 2020-10-27 MED ORDER — BENZONATATE 100 MG PO CAPS: 200.0000 mg | ORAL_CAPSULE | Freq: Three times a day (TID) | ORAL | 0 refills | Status: DC | PRN

## 2020-10-27 NOTE — Progress Notes (Signed)
Subjective:    CC: shortness of breath, cough, chills/sweats  HPI: Pleasant 87 year old male presenting to discuss general malaise, SOB, nonproductive cough, chills, and cold sweats that he has had intermittently since getting his COVID vaccine booster about 4 weeks ago. He was seen by Dr. Lyn Hollingshead who thought his symptoms related to a normal post vaccination immune response. He has continued to have the symptoms for the past couple of weeks in conjunction with unusual fatigue. Over the past few days, he has started to feel better but wanted to be evaluated. Notes he has been a smoker for about 60 years but stopped smoking a week and a half ago. Has noticed his cough is worse since then. No chest or sinus congestion, fever, or chills. No GI symptoms.   I reviewed the past medical history, family history, social history, surgical history, and allergies today and no changes were needed.  Please see the problem list section below in epic for further details.  Past Medical History: Past Medical History:  Diagnosis Date  . Abnormal weight loss   . COPD (chronic obstructive pulmonary disease) ([facility])   . Hyperlipidemia   . Osteoporosis 09/01/2010   Qualifier: Diagnosis of  By: Nicholaus Corolla CMA, (AAMA), Sue Lush     Past Surgical History: Past Surgical History:  Procedure Laterality Date  . COLONOSCOPY    . INNER EAR SURGERY     hole in eardrum 30 years ago   Social History: Social History   Socioeconomic History  . Marital status: Married    Spouse name: Not on file  . Number of children: Not on file  . Years of education: Not on file  . Highest education level: Not on file  Occupational History  . Not on file  Tobacco Use  . Smoking status: Current Every Day Smoker    Packs/day: 0.50    Types: Cigarettes  . Smokeless tobacco: Never Used  Vaping Use  . Vaping Use: Never used  Substance and Sexual Activity  . Alcohol use: Yes    Alcohol/week: 0.0 standard drinks    Comment: 2-3  drinks per week   . Drug use: No  . Sexual activity: Not Currently  Other Topics Concern  . Not on file  Social History Narrative  . Not on file   Social Determinants of Health   Financial Resource Strain: Not on file  Food Insecurity: Not on file  Transportation Needs: Not on file  Physical Activity: Not on file  Stress: Not on file  Social Connections: Not on file   Family History: Family History  Problem Relation Age of Onset  . Cancer Father        mouth cancer    Allergies: No Known Allergies Medications: See med rec.  Review of Systems: See HPI for pertinent positives and negatives.   Objective:    General: Well Developed, well nourished, and in no acute distress.  Neuro: Alert and oriented x3.  HEENT: Normocephalic, atraumatic.  Skin: Warm and dry. Cardiac: Regular rate and rhythm, no murmurs rubs or gallops, no lower extremity edema.  Respiratory: Clear to auscultation bilaterally. Not using accessory muscles, speaking in full sentences.  Impression and Recommendations:    1. Cough Suspect cough is related to smoking cessation. Less concerned for infectious process at this time. Does have COPD but no wheezing or mucus production to indicate exacerbation.   2. Postimmunization reaction, subsequent encounter No COVID testing at the initial symptoms post-booster so cannot definitively rule out COVID infection. At  4 weeks post-symptoms, recommend symptomatic treatment with rest, fluids, etc. Continue smoking cessation efforts. Reassurance provided that symptoms present today are minimal and not alarming. Recent labs reviewed with no red flags.   Return if symptoms worsen or fail to improve. ___________________________________________ Clearnce Sorrel, DNP, APRN, FNP-BC Primary Care and [place]

## 2020-11-16 ENCOUNTER — Encounter: Payer: Self-pay | Admitting: Osteopathic Medicine

## 2020-11-19 ENCOUNTER — Encounter: Payer: Self-pay | Admitting: Osteopathic Medicine

## 2020-11-19 ENCOUNTER — Other Ambulatory Visit: Payer: Self-pay

## 2020-11-19 ENCOUNTER — Ambulatory Visit (INDEPENDENT_AMBULATORY_CARE_PROVIDER_SITE_OTHER): Payer: Medicare Other | Admitting: Osteopathic Medicine

## 2020-11-19 VITALS — BP 134/76 | HR 105 | Temp 97.5°F | Wt 120.0 lb

## 2020-11-19 DIAGNOSIS — R634 Abnormal weight loss: Secondary | ICD-10-CM

## 2020-11-19 DIAGNOSIS — J439 Emphysema, unspecified: Secondary | ICD-10-CM | POA: Diagnosis not present

## 2020-11-19 DIAGNOSIS — J90 Pleural effusion, not elsewhere classified: Secondary | ICD-10-CM

## 2020-11-19 DIAGNOSIS — I10 Essential (primary) hypertension: Secondary | ICD-10-CM | POA: Diagnosis not present

## 2020-11-19 DIAGNOSIS — R5382 Chronic fatigue, unspecified: Secondary | ICD-10-CM

## 2020-11-19 DIAGNOSIS — K635 Polyp of colon: Secondary | ICD-10-CM | POA: Diagnosis not present

## 2020-11-19 DIAGNOSIS — R972 Elevated prostate specific antigen [PSA]: Secondary | ICD-10-CM | POA: Diagnosis not present

## 2020-11-19 DIAGNOSIS — N401 Enlarged prostate with lower urinary tract symptoms: Secondary | ICD-10-CM | POA: Diagnosis not present

## 2020-11-19 DIAGNOSIS — F17200 Nicotine dependence, unspecified, uncomplicated: Secondary | ICD-10-CM

## 2020-11-19 DIAGNOSIS — M8580 Other specified disorders of bone density and structure, unspecified site: Secondary | ICD-10-CM

## 2020-11-19 MED ORDER — IBUPROFEN 600 MG PO TABS: 600.0000 mg | ORAL_TABLET | Freq: Three times a day (TID) | ORAL | 0 refills | Status: DC | PRN

## 2020-11-19 NOTE — Progress Notes (Signed)
HPI: Timothy Waller is a 87 y.o. male who  has a past medical history of Abnormal weight loss, COPD (chronic obstructive pulmonary disease) ([place]), Hyperlipidemia, and Osteoporosis (09/01/2010).  he presents to [facility] today, 11/19/20,  for chief complaint of:  Pain L side  . Context: lifted mattress Sunday morning and felt a pop on L side, then pain started Sunday evening . Location: L side torso radiating to L shoulder and L lower back  . Quality: stretching/sharp . Duration: 4 days . Modifying factors: o Ibuprofen helps pain  o Worse with shoulder flexion and abduction  o Worse when lying on R side . Assoc signs/symptoms:  o Shortness of breath when lying on R side  o No nausea, vomiting, chest pain, palpitations, orthopnea  Weakness/fatigue . Context: started since COVID booster in early december . Quality: pt reports fatigue/weakness and "not feeling right"; has slowly been improving  Duration: month and a half . Assoc signs/sx:  o Had 2 episodes of night sweats and chills about 2 weeks after receiving booster, no episodes prior or since.  o Reports gradual weight loss over past couple of years. (diet consists of toast and juice for breakfast, no lunch, then large dinner), has been supplementing with Boost/Ensure o No abnormal lymph nodes, difficulty breathing/shortness of breath, constipation, diarrhea  Smoking Cessation  Recently quit smoking about 1 month ago and denies any problem with anxiety/irritability since quitting. Is not experiencing any cravings.    Past medical, surgical, social and family history reviewed:  Patient Active Problem List   Diagnosis Date Noted  . Benign localized prostatic hyperplasia with lower urinary tract symptoms (LUTS) 08/25/2020  . Skin lesion of face 10/29/2019  . Lightheadedness 10/29/2019  . Proteinuria 01/31/2018  . Frequent urination 01/31/2018  . Orthostasis 06/12/2017  . Heavy tobacco  smoker 06/12/2017  . Benign colonic polyp 11/02/2016  . Needs flu shot 07/08/2016  . Osteopenia 02/17/2016  . Essential hypertension 02/09/2016  . Special screening for malignant neoplasms, colon 03/05/2015  . Hyperlipidemia 12/16/2014  . COPD with emphysema ([place]) 12/15/2014  . SHOULDER PAIN 08/24/2010  . HEMATURIA UNSPECIFIED 06/11/2009  . ERECTILE DYSFUNCTION, ORGANIC 06/11/2009  . SEBORRHEIC KERATOSIS 06/11/2009  . LUMBAGO 06/11/2009    Past Surgical History:  Procedure Laterality Date  . COLONOSCOPY    . INNER EAR SURGERY     hole in eardrum 30 years ago    Social History   Tobacco Use  . Smoking status: Former Smoker    Packs/day: 0.50    Types: Cigarettes    Quit date: 10/24/2020    Years since quitting: 0.0  . Smokeless tobacco: Never Used  Substance Use Topics  . Alcohol use: Yes    Alcohol/week: 0.0 standard drinks    Comment: 2-3 drinks per week     Family History  Problem Relation Age of Onset  . Cancer Father        mouth cancer      Current medication list and allergy/intolerance information reviewed:    Current Outpatient Medications  Medication Sig Dispense Refill  . albuterol (VENTOLIN HFA) 108 (90 Base) MCG/ACT inhaler Inhale 2 puffs into the lungs every 6 (six) hours as needed for wheezing. 2 each 11  . Ascorbic Acid (VITAMIN C) 1000 MG tablet Take 1,000 mg by mouth daily.    Marland Kitchen atorvastatin (LIPITOR) 10 MG tablet TAKE 1 TABLET BY MOUTH EVERY DAY/LABS FOR REFILLS 90 tablet 2  . benzonatate (TESSALON) 200 MG  capsule Take 1 capsule (200 mg total) by mouth 3 (three) times daily as needed for cough. 30 capsule 0  . COD LIVER OIL PO Take by mouth.    Marland Kitchen ibuprofen (ADVIL) 600 MG tablet Take 1 tablet (600 mg total) by mouth every 8 (eight) hours as needed. 30 tablet 0  . Multiple Vitamins-Minerals (CENTRUM PO) Take by mouth.    . Omega-3 Fatty Acids (FISH OIL) 1000 MG CAPS Take by mouth.    . tiotropium (SPIRIVA) 18 MCG inhalation capsule Place 1 capsule  (18 mcg total) into inhaler and inhale daily. FOR MAINTENANCE 90 capsule 3   No current facility-administered medications for this visit.    No Known Allergies    Review of Systems:  Constitutional:  No  fever, + chills, No recent illness, + unintentional weight loss. + significant fatigue. + night sweats  HEENT: No  headache  Cardiac: No  chest pain, No  pressure, No palpitations, No  Orthopnea  Respiratory:  No  shortness of breath. + Cough  Gastrointestinal: No  abdominal pain, No  nausea, No  vomiting,  No  blood in stool, No  diarrhea, No  constipation   Musculoskeletal:+ new myalgia L side, shoulder, and back   Skin: No  Rash  Hem/Onc: No  easy bruising/bleeding, No  abnormal lymph node  Neurologic: + genralized  weakness, No  dizziness  Psychiatric: No  concerns with depression, No  concerns with anxiety, No sleep problems, No mood problems  Exam:  BP 134/76 (BP Location: Left Arm, Patient Position: Sitting, Cuff Size: Small)   Pulse (!) 105   Temp (!) 97.5 F (36.4 C) (Oral)   Wt 120 lb (54.4 kg)   SpO2 96%   BMI 17.72 kg/m   Constitutional: VS see above. General Appearance: alert, well-developed, well-nourished, NAD  Eyes: Normal lids and conjunctive, non-icteric sclera  Neck: No masses, trachea midline. No thyroid enlargement. No tenderness/mass appreciated. No lymphadenopathy  Respiratory: Normal respiratory effort. no wheeze, no rhonchi, no rales  Cardiovascular: S1/S2 normal, no murmur, no rub/gallop auscultated. RRR. No lower extremity edema.   Gastrointestinal: + discomfort LUQ and LLQ, no masses. No hepatomegaly, no splenomegaly. No hernia appreciated.  Rectal exam deferred.   Musculoskeletal: Pain in L side and shoulder with flexion of shoulder and abduction of shoulder.   Neurological: Normal balance/coordination. No tremor.   Skin: warm, dry, intact. No rash/ulcer.   Psychiatric: Normal judgment/insight. Normal mood and affect.    No  results found for this or any previous visit (from the past 72 hour(s)).  No results found.   ASSESSMENT/PLAN: The primary encounter diagnosis was Weight loss. Diagnoses of Chronic fatigue, Osteopenia, unspecified location, Benign localized prostatic hyperplasia with lower urinary tract symptoms (LUTS), Essential hypertension, Benign colonic polyp, Hx Heavy tobacco smoker, and Pulmonary emphysema, unspecified emphysema type ([place]) were also pertinent to this visit.   L sided pain - no red flag sx; consistent w/ MSK injury   Rx 600 mg ibuprofen   If symptoms worsen or do not improve in next 3-4 days will refer for a couple of sessions of physical therapy   Chronic fatigue and weight loss - no red flag symptoms; recent labs reviewed w/ no red flags. Does not seem related to COVID booster given persistence of sx.   CT chest, abdomen, pelvis w/ contrast  CBC with diff  CMP with GFR  TSH and free T4   PSA w/ reflex free PSA  HIV and HCV testing  ESR  and CRP   UA w/ UCx  Follow up on 11/25/2020 to review labs and CT results  If all labs normal will consider appetite stimulants for weight loss   Orders Placed This Encounter  Procedures  . CT Abdomen Pelvis W Contrast  . CT Chest W Contrast  . CBC with Differential/Platelet  . COMPLETE METABOLIC PANEL WITH GFR  . TSH  . T4, free  . PSA, Total with Reflex to PSA, Free  . HIV Antibody (routine testing w rflx)  . Hepatitis C antibody  . Sedimentation rate  . High sensitivity CRP  . Urinalysis with Culture Reflex    Meds ordered this encounter  Medications  . ibuprofen (ADVIL) 600 MG tablet    Sig: Take 1 tablet (600 mg total) by mouth every 8 (eight) hours as needed.    Dispense:  30 tablet    Refill:  0    Patient Instructions  For persistent weight loss and fatigue - gradual changes are less concerning and fast changes. Some of this might be related to vaccine but I don' think this is very likely. Labs were ok in  08/2020 but let's repeat these and get some additional testing to rule out inflammatory condition, cancer, other reasons for fatigue.    Plan: Labs today Imaging: CT scan of chest and abdomen and pelvis  Consider follow-up in office to test lung function Increase activity as able Increase calorie intake w/ proteins and vegetables, can increase healthy fats          Visit summary with medication list and pertinent instructions was printed for patient to review. All questions at time of visit were answered - patient instructed to contact office with any additional concerns or updates. ER/RTC precautions were reviewed with the patient.     Please note: voice recognition software was used to produce this document, and typos may escape review. Please contact Dr. Sheppard Coil for any needed clarifications.     Follow-up plan: Return for RECHECK PENDING RESULTS / IF WORSE OR CHANGE.

## 2020-11-19 NOTE — Patient Instructions (Addendum)
For persistent weight loss and fatigue - gradual changes are less concerning and fast changes. Some of this might be related to vaccine but I don' think this is very likely. Labs were ok in 08/2020 but let's repeat these and get some additional testing to rule out inflammatory condition, cancer, other reasons for fatigue.    Plan: Labs today Imaging: CT scan of chest and abdomen and pelvis  Consider follow-up in office to test lung function Increase activity as able Increase calorie intake w/ proteins and vegetables, can increase healthy fats

## 2020-11-20 ENCOUNTER — Encounter: Payer: Self-pay | Admitting: Osteopathic Medicine

## 2020-11-20 MED ORDER — CIPROFLOXACIN HCL 500 MG PO TABS: 500.0000 mg | ORAL_TABLET | Freq: Every day | ORAL | 0 refills | Status: AC

## 2020-11-20 NOTE — Addendum Note (Signed)
Addended by: Maryla Morrow on: 11/20/2020 02:11 PM   Modules accepted: Orders

## 2020-11-21 LAB — COMPLETE METABOLIC PANEL WITH GFR
AG Ratio: 1 (calc) (ref 1.0–2.5)
ALT: 32 U/L (ref 9–46)
AST: 29 U/L (ref 10–35)
Albumin: 3 g/dL — ABNORMAL LOW (ref 3.6–5.1)
Alkaline phosphatase (APISO): 174 U/L — ABNORMAL HIGH (ref 35–144)
BUN/Creatinine Ratio: 29 (calc) — ABNORMAL HIGH (ref 6–22)
BUN: 47 mg/dL — ABNORMAL HIGH (ref 7–25)
CO2: 28 mmol/L (ref 20–32)
Calcium: 9.2 mg/dL (ref 8.6–10.3)
Chloride: 100 mmol/L (ref 98–110)
Creat: 1.6 mg/dL — ABNORMAL HIGH (ref 0.70–1.11)
GFR, Est African American: 45 mL/min/{1.73_m2} — ABNORMAL LOW (ref >=60)
GFR, Est Non African American: 39 mL/min/{1.73_m2} — ABNORMAL LOW (ref >=60)
Globulin: 3.1 g/dL (calc) (ref 1.9–3.7)
Glucose, Bld: 174 mg/dL — ABNORMAL HIGH (ref 65–139)
Potassium: 4.4 mmol/L (ref 3.5–5.3)
Sodium: 139 mmol/L (ref 135–146)
Total Bilirubin: 0.5 mg/dL (ref 0.2–1.2)
Total Protein: 6.1 g/dL (ref 6.1–8.1)

## 2020-11-21 LAB — URINALYSIS W MICROSCOPIC + REFLEX CULTURE
Bilirubin Urine: NEGATIVE
Glucose, UA: NEGATIVE
Hgb urine dipstick: NEGATIVE
Ketones, ur: NEGATIVE
Nitrites, Initial: NEGATIVE
RBC / HPF: NONE SEEN /HPF (ref 0–2)
Specific Gravity, Urine: 1.022 (ref 1.001–1.03)
pH: <=5 (ref 5.0–8.0)

## 2020-11-21 LAB — CBC WITH DIFFERENTIAL/PLATELET
Absolute Monocytes: 1046 cells/uL — ABNORMAL HIGH (ref 200–950)
Basophils Absolute: 17 cells/uL (ref 0–200)
Basophils Relative: 0.1 %
Eosinophils Absolute: 33 cells/uL (ref 15–500)
Eosinophils Relative: 0.2 %
HCT: 34.7 % — ABNORMAL LOW (ref 38.5–50.0)
Hemoglobin: 11.8 g/dL — ABNORMAL LOW (ref 13.2–17.1)
Lymphs Abs: 780 cells/uL — ABNORMAL LOW (ref 850–3900)
MCH: 29.1 pg (ref 27.0–33.0)
MCHC: 34 g/dL (ref 32.0–36.0)
MCV: 85.7 fL (ref 80.0–100.0)
MPV: 9.4 fL (ref 7.5–12.5)
Monocytes Relative: 6.3 %
Neutro Abs: 14724 cells/uL — ABNORMAL HIGH (ref 1500–7800)
Neutrophils Relative %: 88.7 %
Platelets: 372 10*3/uL (ref 140–400)
RBC: 4.05 10*6/uL — ABNORMAL LOW (ref 4.20–5.80)
RDW: 12.4 % (ref 11.0–15.0)
Total Lymphocyte: 4.7 %
WBC: 16.6 10*3/uL — ABNORMAL HIGH (ref 3.8–10.8)

## 2020-11-21 LAB — URINE CULTURE
MICRO NUMBER:: 11469277
Result:: NO GROWTH
SPECIMEN QUALITY:: ADEQUATE

## 2020-11-21 LAB — REFLEX PSA, FREE
PSA, % Free: 34 % (calc) (ref >25)
PSA, Free: 2.6 ng/mL

## 2020-11-21 LAB — CULTURE INDICATED

## 2020-11-21 LAB — TSH: TSH: 1.68 mIU/L (ref 0.40–4.50)

## 2020-11-21 LAB — T4, FREE: Free T4: 1.2 ng/dL (ref 0.8–1.8)

## 2020-11-21 LAB — PSA, TOTAL WITH REFLEX TO PSA, FREE: PSA, Total: 7.6 ng/mL — ABNORMAL HIGH (ref <=4.0)

## 2020-11-21 LAB — HIV ANTIBODY (ROUTINE TESTING W REFLEX): HIV 1&2 Ab, 4th Generation: NONREACTIVE

## 2020-11-21 LAB — HEPATITIS C ANTIBODY
Hepatitis C Ab: NONREACTIVE
SIGNAL TO CUT-OFF: 0.02 (ref <1.00)

## 2020-11-21 LAB — SEDIMENTATION RATE: Sed Rate: >130 mm/h — ABNORMAL HIGH (ref 0–20)

## 2020-11-21 LAB — HIGH SENSITIVITY CRP: hs-CRP: >10 mg/L — ABNORMAL HIGH

## 2020-11-21 NOTE — Addendum Note (Signed)
Addended by: Maryla Morrow on: 11/21/2020 02:28 PM   Modules accepted: Orders

## 2020-11-23 ENCOUNTER — Other Ambulatory Visit: Payer: Self-pay

## 2020-11-23 ENCOUNTER — Ambulatory Visit (INDEPENDENT_AMBULATORY_CARE_PROVIDER_SITE_OTHER): Payer: Medicare Other

## 2020-11-23 DIAGNOSIS — J918 Pleural effusion in other conditions classified elsewhere: Secondary | ICD-10-CM

## 2020-11-23 DIAGNOSIS — I700 Atherosclerosis of aorta: Secondary | ICD-10-CM | POA: Diagnosis not present

## 2020-11-23 DIAGNOSIS — J439 Emphysema, unspecified: Secondary | ICD-10-CM

## 2020-11-23 DIAGNOSIS — J90 Pleural effusion, not elsewhere classified: Secondary | ICD-10-CM | POA: Diagnosis not present

## 2020-11-23 DIAGNOSIS — J479 Bronchiectasis, uncomplicated: Secondary | ICD-10-CM | POA: Diagnosis not present

## 2020-11-23 DIAGNOSIS — R5383 Other fatigue: Secondary | ICD-10-CM | POA: Diagnosis not present

## 2020-11-23 DIAGNOSIS — J9811 Atelectasis: Secondary | ICD-10-CM | POA: Diagnosis not present

## 2020-11-23 DIAGNOSIS — N281 Cyst of kidney, acquired: Secondary | ICD-10-CM | POA: Diagnosis not present

## 2020-11-23 DIAGNOSIS — K8020 Calculus of gallbladder without cholecystitis without obstruction: Secondary | ICD-10-CM

## 2020-11-23 DIAGNOSIS — R634 Abnormal weight loss: Secondary | ICD-10-CM | POA: Diagnosis not present

## 2020-11-23 MED ORDER — IOHEXOL 300 MG/ML  SOLN
100.0000 mL | Freq: Once | INTRAMUSCULAR | Status: AC | PRN
  Administered 2020-11-23: 80 mL via INTRAVENOUS

## 2020-11-23 NOTE — Addendum Note (Signed)
Addended by: Maryla Morrow on: 11/23/2020 05:25 PM   Modules accepted: Orders

## 2020-11-25 ENCOUNTER — Telehealth: Payer: Self-pay | Admitting: Internal Medicine

## 2020-11-25 ENCOUNTER — Encounter: Payer: Self-pay | Admitting: Internal Medicine

## 2020-11-25 ENCOUNTER — Encounter (HOSPITAL_COMMUNITY): Payer: Self-pay | Admitting: Pulmonary Disease

## 2020-11-25 ENCOUNTER — Telehealth: Payer: Self-pay | Admitting: Osteopathic Medicine

## 2020-11-25 ENCOUNTER — Encounter: Payer: Self-pay | Admitting: Osteopathic Medicine

## 2020-11-25 ENCOUNTER — Inpatient Hospital Stay (HOSPITAL_COMMUNITY)
Admission: AD | Admit: 2020-11-25 | Discharge: 2020-11-29 | DRG: 177 | Disposition: A | Discharge status: Home Health | Payer: Medicare Other | Source: Ambulatory Visit | Attending: Pulmonary Disease | Admitting: Pulmonary Disease

## 2020-11-25 ENCOUNTER — Other Ambulatory Visit: Payer: Self-pay

## 2020-11-25 ENCOUNTER — Ambulatory Visit (INDEPENDENT_AMBULATORY_CARE_PROVIDER_SITE_OTHER): Payer: Medicare Other | Admitting: Osteopathic Medicine

## 2020-11-25 ENCOUNTER — Ambulatory Visit (INDEPENDENT_AMBULATORY_CARE_PROVIDER_SITE_OTHER): Payer: Medicare Other | Admitting: Internal Medicine

## 2020-11-25 VITALS — BP 117/59 | HR 87 | Temp 97.7°F | Wt 118.0 lb

## 2020-11-25 VITALS — BP 102/74 | HR 88 | Temp 97.8°F | Ht 68.0 in | Wt 120.4 lb

## 2020-11-25 DIAGNOSIS — K089 Disorder of teeth and supporting structures, unspecified: Secondary | ICD-10-CM | POA: Diagnosis not present

## 2020-11-25 DIAGNOSIS — Z20822 Contact with and (suspected) exposure to covid-19: Secondary | ICD-10-CM | POA: Diagnosis present

## 2020-11-25 DIAGNOSIS — Z4682 Encounter for fitting and adjustment of non-vascular catheter: Secondary | ICD-10-CM | POA: Diagnosis not present

## 2020-11-25 DIAGNOSIS — R911 Solitary pulmonary nodule: Secondary | ICD-10-CM | POA: Diagnosis not present

## 2020-11-25 DIAGNOSIS — Z681 Body mass index (BMI) 19 or less, adult: Secondary | ICD-10-CM

## 2020-11-25 DIAGNOSIS — J869 Pyothorax without fistula: Principal | ICD-10-CM | POA: Diagnosis present

## 2020-11-25 DIAGNOSIS — E43 Unspecified severe protein-calorie malnutrition: Secondary | ICD-10-CM | POA: Diagnosis present

## 2020-11-25 DIAGNOSIS — I2510 Atherosclerotic heart disease of native coronary artery without angina pectoris: Secondary | ICD-10-CM | POA: Diagnosis not present

## 2020-11-25 DIAGNOSIS — J90 Pleural effusion, not elsewhere classified: Secondary | ICD-10-CM | POA: Diagnosis present

## 2020-11-25 DIAGNOSIS — M810 Age-related osteoporosis without current pathological fracture: Secondary | ICD-10-CM | POA: Diagnosis present

## 2020-11-25 DIAGNOSIS — J9690 Respiratory failure, unspecified, unspecified whether with hypoxia or hypercapnia: Secondary | ICD-10-CM | POA: Diagnosis not present

## 2020-11-25 DIAGNOSIS — J984 Other disorders of lung: Secondary | ICD-10-CM | POA: Diagnosis not present

## 2020-11-25 DIAGNOSIS — J439 Emphysema, unspecified: Secondary | ICD-10-CM | POA: Diagnosis present

## 2020-11-25 DIAGNOSIS — D649 Anemia, unspecified: Secondary | ICD-10-CM | POA: Diagnosis present

## 2020-11-25 DIAGNOSIS — J189 Pneumonia, unspecified organism: Secondary | ICD-10-CM | POA: Diagnosis present

## 2020-11-25 DIAGNOSIS — J9811 Atelectasis: Secondary | ICD-10-CM | POA: Diagnosis not present

## 2020-11-25 DIAGNOSIS — Z720 Tobacco use: Secondary | ICD-10-CM | POA: Diagnosis present

## 2020-11-25 DIAGNOSIS — E876 Hypokalemia: Secondary | ICD-10-CM | POA: Diagnosis present

## 2020-11-25 DIAGNOSIS — Z9689 Presence of other specified functional implants: Secondary | ICD-10-CM

## 2020-11-25 DIAGNOSIS — R64 Cachexia: Secondary | ICD-10-CM | POA: Diagnosis present

## 2020-11-25 DIAGNOSIS — E785 Hyperlipidemia, unspecified: Secondary | ICD-10-CM | POA: Diagnosis present

## 2020-11-25 DIAGNOSIS — R627 Adult failure to thrive: Secondary | ICD-10-CM | POA: Diagnosis present

## 2020-11-25 DIAGNOSIS — J432 Centrilobular emphysema: Secondary | ICD-10-CM | POA: Diagnosis not present

## 2020-11-25 DIAGNOSIS — Z87891 Personal history of nicotine dependence: Secondary | ICD-10-CM

## 2020-11-25 DIAGNOSIS — J449 Chronic obstructive pulmonary disease, unspecified: Secondary | ICD-10-CM | POA: Diagnosis not present

## 2020-11-25 DIAGNOSIS — Z7951 Long term (current) use of inhaled steroids: Secondary | ICD-10-CM | POA: Diagnosis not present

## 2020-11-25 DIAGNOSIS — Z79899 Other long term (current) drug therapy: Secondary | ICD-10-CM | POA: Diagnosis not present

## 2020-11-25 DIAGNOSIS — F17200 Nicotine dependence, unspecified, uncomplicated: Secondary | ICD-10-CM

## 2020-11-25 DIAGNOSIS — R972 Elevated prostate specific antigen [PSA]: Secondary | ICD-10-CM | POA: Diagnosis not present

## 2020-11-25 DIAGNOSIS — R091 Pleurisy: Secondary | ICD-10-CM | POA: Diagnosis not present

## 2020-11-25 DIAGNOSIS — J438 Other emphysema: Secondary | ICD-10-CM | POA: Diagnosis not present

## 2020-11-25 LAB — PROTIME-INR
INR: 1.3 — ABNORMAL HIGH (ref 0.8–1.2)
Prothrombin Time: 15.7 seconds — ABNORMAL HIGH (ref 11.4–15.2)

## 2020-11-25 MED ORDER — BREZTRI AEROSPHERE 160-9-4.8 MCG/ACT IN AERO: 2.0000 | INHALATION_SPRAY | Freq: Two times a day (BID) | RESPIRATORY_TRACT | 0 refills | Status: DC

## 2020-11-25 MED ORDER — SODIUM CHLORIDE 0.9 % IV SOLN
INTRAVENOUS | Status: DC | PRN
  Administered 2020-11-25: 250 mL via INTRAVENOUS

## 2020-11-25 MED ORDER — DOCUSATE SODIUM 100 MG PO CAPS: 100.0000 mg | ORAL_CAPSULE | Freq: Two times a day (BID) | ORAL | Status: DC | PRN

## 2020-11-25 MED ORDER — HEPARIN SODIUM (PORCINE) 5000 UNIT/ML IJ SOLN
5000.0000 [IU] | Freq: Three times a day (TID) | INTRAMUSCULAR | Status: DC
  Administered 2020-11-25 – 2020-11-29 (×10): 5000 [IU] via SUBCUTANEOUS
  Filled 2020-11-25 (×10): qty 1

## 2020-11-25 MED ORDER — ALBUTEROL SULFATE (2.5 MG/3ML) 0.083% IN NEBU: 2.5000 mg | INHALATION_SOLUTION | RESPIRATORY_TRACT | Status: DC | PRN

## 2020-11-25 MED ORDER — LACTATED RINGERS IV SOLN: INTRAVENOUS | Status: DC

## 2020-11-25 MED ORDER — ATORVASTATIN CALCIUM 10 MG PO TABS
10.0000 mg | ORAL_TABLET | Freq: Every day | ORAL | Status: DC
  Administered 2020-11-26 – 2020-11-29 (×4): 10 mg via ORAL
  Filled 2020-11-25 (×4): qty 1

## 2020-11-25 MED ORDER — VANCOMYCIN HCL 500 MG/100ML IV SOLN: 500.0000 mg | INTRAVENOUS | Status: DC

## 2020-11-25 MED ORDER — POLYETHYLENE GLYCOL 3350 17 G PO PACK: 17.0000 g | PACK | Freq: Every day | ORAL | Status: DC | PRN

## 2020-11-25 MED ORDER — PIPERACILLIN-TAZOBACTAM 3.375 G IVPB
3.3750 g | Freq: Three times a day (TID) | INTRAVENOUS | Status: DC
  Administered 2020-11-25 – 2020-11-26 (×3): 3.375 g via INTRAVENOUS
  Filled 2020-11-25 (×3): qty 50

## 2020-11-25 MED ORDER — VANCOMYCIN HCL IN DEXTROSE 1-5 GM/200ML-% IV SOLN
1000.0000 mg | Freq: Once | INTRAVENOUS | Status: AC
  Administered 2020-11-25: 1000 mg via INTRAVENOUS
  Filled 2020-11-25: qty 200

## 2020-11-25 NOTE — Telephone Encounter (Signed)
Triage  Maudie Mercury called admitted on [facility]  and asked for ? [facility]. Has been 2h and patient is not showing up on preadmit list  Plan  - please ask admissions to put this patient on preadmit list - [facility] preferred  - let me know via call what admissions say (I am on cell afer 3pm) - do not use secure chat after 3pm  - pls give my cell to the daughter - if they go to [facility] admissions at night or after 5pm I Will need to enter admit orderrs or signt out to night float

## 2020-11-25 NOTE — Patient Instructions (Signed)
   Pleural effusion on the left side, AKA fluid on the lungs. Please see pulmonologist! Will defer management to their expertise.   There are no suspicious large lymph nodes in the abdomen/pelvis.  If there were enlarged lymph nodes, it would be more concerning for cancer spread somewhere in the abdomen, so it is reassuring that large lymph nodes were not noted on the scan..  Regarding prostate: On the CT scan, the prostate is quite enlarged but otherwise appears normal.  The PSA levels in the bloodstream were high, but we can expect these to be high with age and with enlarged prostate  (which is why we do not screen for PSA levels routinely in patients over 71), they can also be high any urinary tract infection..  I checked PSA levels because if these were off the charts it would concern me for prostate cancer. Honestly, I am not sure what to make of the PSA results since they are above normal but not severely elevated, but to rule out a cancer we need to get an opinion from a urologist.  Regarding urine: Mixed evidence for urinary tract infection versus something else going on with the urine/bladder/kidneys.  Again, I would like to get an opinion from a urologist, who may recommend additional testing.  People with a history of smoking are prone to bladder cancer more so than the general population, so this again is something that urology will discuss evaluation for.  Inflammatory markers were elevated: White blood cells, CRP, sed rate.  These are not specific for any one type of condition but definitely can be high in inflammation from infection or from cancer, or from chronic stress/malnourishment.    We are taking the following steps to evaluate for cancer: Analysis of lung fluid, urology referral to evaluate bladder/prostate.

## 2020-11-25 NOTE — Progress Notes (Signed)
Pharmacy Antibiotic Note  Timothy Waller is a 87 y.o. male admitted on 11/25/2020 with sepsis.  Pharmacy has been consulted for vancomycin and Zosyn  dosing.  Plan: Zosyn 3.375g IV q8h (4 hour infusion).  Vancomycin 1000 mg IV x1, then 500 mg IV q24h  Daily Scr while on vancomycin and Zosyn Monitor clinical course, renal function, cultures as available      Temp (24hrs), Avg:97.8 F (36.6 C), Min:97.7 F (36.5 C), Max:97.8 F (36.6 C)  Recent Labs  Lab 11/19/20 1101  WBC 16.6*  CREATININE 1.60*    Estimated Creatinine Clearance: 26.5 mL/min (A) (by C-G formula based on SCr of 1.6 mg/dL (H)).    No Known Allergies  Antimicrobials this admission: 2/2 vancomycin >>  2/2 Zosyn >>   Dose adjustments this admission:   Microbiology results:    Thank you for allowing pharmacy to be a part of this patient's care.   Royetta Asal, PharmD, BCPS 11/25/2020 9:10 PM

## 2020-11-25 NOTE — Telephone Encounter (Signed)
Timothy Waller, please advise if you would be okay with pt being added on MR's schedule today at 12pm.

## 2020-11-25 NOTE — Progress Notes (Signed)
HPI: Timothy Waller is a 87 y.o. male who  has a past medical history of Abnormal weight loss, COPD (chronic obstructive pulmonary disease) ([place]), Hyperlipidemia, and Osteoporosis (09/01/2010).  he presents to [facility] today, 11/25/20,  for chief complaint of:  Follow-up results  See phone notes from today. Patient and his daughter are here to discuss results which include CT (+)loculated L pleural effusion. Discussed plan for direct admission at the recommendation of pulmonologist. Patient and daughter were ok with this plan. I reached out to hospitalist. Unfortunately, ER is quite full and no hospital beds will be available for quite some time. I communicated with pulmonology, thankfully they were able to squeeze the patient in today and have opted for direct admission under their service, hopefully sometime soon, there is some concern that the patient will need more than simple thoracentesis to address this effusion/empyema.      ASSESSMENT/PLAN:   1. Pleural effusion likely cause of elevated WBC, ESR/Sed Rate, mild AKI, fatigue and weight loss --> pulm to direct admit  2. Elevated PSA d/t age, BPH, ?urinary abnormality --> urology referral given smoking hx   3. Hx Heavy tobacco smoker     Patient Instructions   Pleural effusion on the left side, AKA fluid on the lungs. Please see pulmonologist! Will defer management to their expertise.   There are no suspicious large lymph nodes in the abdomen/pelvis.  If there were enlarged lymph nodes, it would be more concerning for cancer spread somewhere in the abdomen, so it is reassuring that large lymph nodes were not noted on the scan..  Regarding prostate: On the CT scan, the prostate is quite enlarged but otherwise appears normal.  The PSA levels in the bloodstream were high, but we can expect these to be high with age and with enlarged prostate  (which is why we do not screen for PSA levels routinely  in patients over 71), they can also be high any urinary tract infection..  I checked PSA levels because if these were off the charts it would concern me for prostate cancer. Honestly, I am not sure what to make of the PSA results since they are above normal but not severely elevated, but to rule out a cancer we need to get an opinion from a urologist.  Regarding urine: Mixed evidence for urinary tract infection versus something else going on with the urine/bladder/kidneys.  Again, I would like to get an opinion from a urologist, who may recommend additional testing.  People with a history of smoking are prone to bladder cancer more so than the general population, so this again is something that urology will discuss evaluation for.  Inflammatory markers were elevated: White blood cells, CRP, sed rate.  These are not specific for any one type of condition but definitely can be high in inflammation from infection or from cancer, or from chronic stress/malnourishment.    We are taking the following steps to evaluate for cancer: Analysis of lung fluid, urology referral to evaluate bladder/prostate.     Follow-up plan: Return for pending pulmonary admission .                                                 ################################################# ################################################# ################################################# #################################################    No outpatient medications have been marked as taking for the 11/25/20 encounter (  Appointment) with Emeterio Reeve, DO.    No Known Allergies     Review of Systems: Pertinent (+) and (-) ROS in HPI as above   Exam:  BP (!) 117/59 (BP Location: Left Arm, Patient Position: Sitting, Cuff Size: Small)   Pulse 87   Temp 97.7 F (36.5 C) (Oral)   Wt 118 lb (53.5 kg)   SpO2 94%   BMI 17.43 kg/m   Constitutional: VS see above. General  Appearance: alert, well-developed, well-nourished, NAD  Neck: No masses, trachea midline.   Respiratory: Normal respiratory effort. no wheeze, no rhonchi, no rales   Cardiovascular: S1/S2 normal, no murmur, no rub/gallop auscultated. RRR.   Musculoskeletal: Gait normal. Symmetric and independent movement of all extremities  Abdominal: non-tender, non-distended, no appreciable organomegaly, neg Murphy's, BS WNLx4  Neurological: Normal balance/coordination. No tremor.  Skin: warm, dry, intact.   Psychiatric: Normal judgment/insight. Normal mood and affect. Oriented x3.   CT Chest W Contrast  Result Date: 11/23/2020 CLINICAL DATA:  Left chest/abdominal pain, weight loss, fatigue EXAM: CT CHEST, ABDOMEN, AND PELVIS WITH CONTRAST TECHNIQUE: Multidetector CT imaging of the chest, abdomen and pelvis was performed following the standard protocol during bolus administration of intravenous contrast. CONTRAST:  36mL OMNIPAQUE IOHEXOL 300 MG/ML  SOLN COMPARISON:  Chest radiographs dated 06/12/2017 FINDINGS: CT CHEST FINDINGS Cardiovascular: The heart is normal in size. No pericardial effusion. No evidence of thoracic aortic aneurysm. Atherosclerotic calcifications of the aortic arch. Mild three-vessel coronary atherosclerosis. Mediastinum/Nodes: No suspicious mediastinal lymphadenopathy. Visualized thyroid is unremarkable. Lungs/Pleura: Moderate to large loculated left pleural effusion. Associated compressive atelectasis in the lingula and left lower lobe. Biapical pleural-parenchymal scarring. Mild centrilobular and paraseptal emphysematous changes, upper lung predominant. Bronchiectasis with faint peribronchovascular nodularity in the posterior right upper lobe, lateral right middle lobe, right lower lobe, and lingula (for example, series 4/image 57). This appearance is favored to be post infectious/inflammatory, possibly reflecting chronic atypical mycobacterial infection such as MAI. No  suspicious/dominant pulmonary nodules. No pneumothorax. Musculoskeletal: No focal osseous lesions. CT ABDOMEN PELVIS FINDINGS Hepatobiliary: Liver is within normal limits. 2.0 cm gallstone (series 2/image 80). Gallbladder is decompressed, without associated inflammatory changes. Pancreas: Within normal limits. Spleen: Within normal limits. Adrenals/Urinary Tract: Adrenal glands are within normal limits. Bilateral renal cysts, measuring to 3.1 cm in the posterior right upper kidney (series 2/image 68). No hydronephrosis. Bladder is underdistended but unremarkable. Stomach/Bowel: Stomach is within normal limits. No evidence of bowel obstruction. Normal appendix (series 2/image 95). Sigmoid diverticulosis, without evidence of diverticulitis. Vascular/Lymphatic: No evidence of abdominal aortic aneurysm. Atherosclerotic calcifications of the abdominal aorta and branch vessels. No suspicious abdominopelvic lymphadenopathy. Reproductive: Prostatomegaly. Other: No abdominopelvic ascites. Musculoskeletal: Mild degenerative changes of the lumbar spine. IMPRESSION: Moderate to large loculated left pleural effusion. Associated compressive atelectasis in the lingula and left lower lobe. Bronchiectasis with faint peribronchovascular nodularity in the lungs bilaterally, favored to be post infectious/inflammatory, possibly reflecting chronic atypical mycobacterial infection such as MAI. Cholelithiasis, without associated inflammatory changes. Additional ancillary findings as above. Aortic Atherosclerosis (ICD10-I70.0) and Emphysema (ICD10-J43.9). Electronically Signed   By: Julian Hy M.D.   On: 11/23/2020 15:38   CT Abdomen Pelvis W Contrast  Result Date: 11/23/2020 CLINICAL DATA:  Left chest/abdominal pain, weight loss, fatigue EXAM: CT CHEST, ABDOMEN, AND PELVIS WITH CONTRAST TECHNIQUE: Multidetector CT imaging of the chest, abdomen and pelvis was performed following the standard protocol during bolus administration  of intravenous contrast. CONTRAST:  84mL OMNIPAQUE IOHEXOL 300 MG/ML  SOLN COMPARISON:  Chest radiographs dated  06/12/2017 FINDINGS: CT CHEST FINDINGS Cardiovascular: The heart is normal in size. No pericardial effusion. No evidence of thoracic aortic aneurysm. Atherosclerotic calcifications of the aortic arch. Mild three-vessel coronary atherosclerosis. Mediastinum/Nodes: No suspicious mediastinal lymphadenopathy. Visualized thyroid is unremarkable. Lungs/Pleura: Moderate to large loculated left pleural effusion. Associated compressive atelectasis in the lingula and left lower lobe. Biapical pleural-parenchymal scarring. Mild centrilobular and paraseptal emphysematous changes, upper lung predominant. Bronchiectasis with faint peribronchovascular nodularity in the posterior right upper lobe, lateral right middle lobe, right lower lobe, and lingula (for example, series 4/image 57). This appearance is favored to be post infectious/inflammatory, possibly reflecting chronic atypical mycobacterial infection such as MAI. No suspicious/dominant pulmonary nodules. No pneumothorax. Musculoskeletal: No focal osseous lesions. CT ABDOMEN PELVIS FINDINGS Hepatobiliary: Liver is within normal limits. 2.0 cm gallstone (series 2/image 80). Gallbladder is decompressed, without associated inflammatory changes. Pancreas: Within normal limits. Spleen: Within normal limits. Adrenals/Urinary Tract: Adrenal glands are within normal limits. Bilateral renal cysts, measuring to 3.1 cm in the posterior right upper kidney (series 2/image 68). No hydronephrosis. Bladder is underdistended but unremarkable. Stomach/Bowel: Stomach is within normal limits. No evidence of bowel obstruction. Normal appendix (series 2/image 95). Sigmoid diverticulosis, without evidence of diverticulitis. Vascular/Lymphatic: No evidence of abdominal aortic aneurysm. Atherosclerotic calcifications of the abdominal aorta and branch vessels. No suspicious abdominopelvic  lymphadenopathy. Reproductive: Prostatomegaly. Other: No abdominopelvic ascites. Musculoskeletal: Mild degenerative changes of the lumbar spine. IMPRESSION: Moderate to large loculated left pleural effusion. Associated compressive atelectasis in the lingula and left lower lobe. Bronchiectasis with faint peribronchovascular nodularity in the lungs bilaterally, favored to be post infectious/inflammatory, possibly reflecting chronic atypical mycobacterial infection such as MAI. Cholelithiasis, without associated inflammatory changes. Additional ancillary findings as above. Aortic Atherosclerosis (ICD10-I70.0) and Emphysema (ICD10-J43.9). Electronically Signed   By: Julian Hy M.D.   On: 11/23/2020 15:38    Recent Results (from the past 2160 hour(s))  CBC with Differential/Platelet     Status: Abnormal   Collection Time: 11/19/20 11:01 AM  Result Value Ref Range   WBC 16.6 (H) 3.8 - 10.8 Thousand/uL   RBC 4.05 (L) 4.20 - 5.80 Million/uL   Hemoglobin 11.8 (L) 13.2 - 17.1 g/dL   HCT 34.7 (L) 38.5 - 50.0 %   MCV 85.7 80.0 - 100.0 fL   MCH 29.1 27.0 - 33.0 pg   MCHC 34.0 32.0 - 36.0 g/dL   RDW 12.4 11.0 - 15.0 %   Platelets 372 140 - 400 Thousand/uL   MPV 9.4 7.5 - 12.5 fL   Neutro Abs 14,724 (H) 1,500 - 7,800 cells/uL   Lymphs Abs 780 (L) 850 - 3,900 cells/uL   Absolute Monocytes 1,046 (H) 200 - 950 cells/uL   Eosinophils Absolute 33 15 - 500 cells/uL   Basophils Absolute 17 0 - 200 cells/uL   Neutrophils Relative % 88.7 %   Total Lymphocyte 4.7 %   Monocytes Relative 6.3 %   Eosinophils Relative 0.2 %   Basophils Relative 0.1 %  COMPLETE METABOLIC PANEL WITH GFR     Status: Abnormal   Collection Time: 11/19/20 11:01 AM  Result Value Ref Range   Glucose, Bld 174 (H) 65 - 139 mg/dL    Comment: .        Non-fasting reference interval .    BUN 47 (H) 7 - 25 mg/dL   Creat 1.60 (H) 0.70 - 1.11 mg/dL    Comment: For patients >46 years of age, the reference limit for Creatinine is  approximately 13% higher for people identified  as African-American. .    GFR, Est Non African American 39 (L) > OR = 60 mL/min/1.37m2   GFR, Est African American 45 (L) > OR = 60 mL/min/1.52m2   BUN/Creatinine Ratio 29 (H) 6 - 22 (calc)   Sodium 139 135 - 146 mmol/L   Potassium 4.4 3.5 - 5.3 mmol/L   Chloride 100 98 - 110 mmol/L   CO2 28 20 - 32 mmol/L   Calcium 9.2 8.6 - 10.3 mg/dL   Total Protein 6.1 6.1 - 8.1 g/dL   Albumin 3.0 (L) 3.6 - 5.1 g/dL   Globulin 3.1 1.9 - 3.7 g/dL (calc)   AG Ratio 1.0 1.0 - 2.5 (calc)   Total Bilirubin 0.5 0.2 - 1.2 mg/dL   Alkaline phosphatase (APISO) 174 (H) 35 - 144 U/L   AST 29 10 - 35 U/L   ALT 32 9 - 46 U/L  TSH     Status: None   Collection Time: 11/19/20 11:01 AM  Result Value Ref Range   TSH 1.68 0.40 - 4.50 mIU/L  T4, free     Status: None   Collection Time: 11/19/20 11:01 AM  Result Value Ref Range   Free T4 1.2 0.8 - 1.8 ng/dL  PSA, Total with Reflex to PSA, Free     Status: Abnormal   Collection Time: 11/19/20 11:01 AM  Result Value Ref Range   PSA, Total 7.6 (H) < OR = 4.0 ng/mL  HIV Antibody (routine testing w rflx)     Status: None   Collection Time: 11/19/20 11:01 AM  Result Value Ref Range   HIV 1&2 Ab, 4th Generation NON-REACTIVE NON-REACTI    Comment: HIV-1 antigen and HIV-1/HIV-2 antibodies were not detected. There is no laboratory evidence of HIV infection. Marland Kitchen PLEASE NOTE: This information has been disclosed to you from records whose confidentiality may be protected by state law.  If your state requires such protection, then the state law prohibits you from making any further disclosure of the information without the specific written consent of the person to whom it pertains, or as otherwise permitted by law. A general authorization for the release of medical or other information is NOT sufficient for this purpose. . For additional information please refer  to http://education.quest[employer].com/faq/FAQ106 (This link is being provided for informational/ educational purposes only.) . Marland Kitchen The performance of this assay has not been clinically validated in patients less than 76 years old. .   Hepatitis C antibody     Status: None   Collection Time: 11/19/20 11:01 AM  Result Value Ref Range   Hepatitis C Ab NON-REACTIVE NON-REACTI   SIGNAL TO CUT-OFF 0.02 <1.00    Comment: . HCV antibody was non-reactive. There is no laboratory  evidence of HCV infection. . In most cases, no further action is required. However, if recent HCV exposure is suspected, a test for HCV RNA (test code [phone]) is suggested. . For additional information please refer to http://education.[facility].com/faq/FAQ22v1 (This link is being provided for informational/ educational purposes only.) .   Sedimentation rate     Status: Abnormal   Collection Time: 11/19/20 11:01 AM  Result Value Ref Range   Sed Rate >130 (H) 0 - 20 mm/h    Comment: Verified by repeat analysis. .   High sensitivity CRP     Status: Abnormal   Collection Time: 11/19/20 11:01 AM  Result Value Ref Range   hs-CRP >10.0 (H) mg/L    Comment: Reference Range Optimal <1.0 Jellinger PS et al. Peterson [place]  Pract.2017;23(Suppl 2):1-87. . For ages >36 Years: hs-CRP mg/L  Risk According to AHA/CDC Guidelines <1.0         Lower relative cardiovascular risk. 1.0-3.0      Average relative cardiovascular risk. 3.1-10.0     Higher relative cardiovascular risk.              Consider retesting in 1 to 2 weeks to              exclude a benign transient elevation              in the baseline CRP value secondary              to infection or inflammation. >10.0        Persistent elevation, upon retesting,              may be associated with infection and              inflammation. .   Urinalysis with Culture Reflex     Status: Abnormal   Collection Time: 11/19/20 11:01 AM   Specimen: Urine  Result  Value Ref Range   Color, Urine DARK YELLOW YELLOW   APPearance CLOUDY (A) CLEAR   Specific Gravity, Urine 1.022 1.001 - 1.03   pH < OR = 5.0 5.0 - 8.0   Glucose, UA NEGATIVE NEGATIVE   Bilirubin Urine NEGATIVE NEGATIVE   Ketones, ur NEGATIVE NEGATIVE   Hgb urine dipstick NEGATIVE NEGATIVE   Protein, ur 3+ (A) NEGATIVE   Nitrites, Initial NEGATIVE NEGATIVE   Leukocyte Esterase 1+ (A) NEGATIVE   WBC, UA 10-20 (A) 0 - 5 /HPF   RBC / HPF NONE SEEN 0 - 2 /HPF   Squamous Epithelial / LPF 6-10 (A) < OR = 5 /HPF   Bacteria, UA MODERATE (A) NONE SEEN /HPF   Hyaline Cast 0-1 (A) NONE SEEN /LPF  Urine Culture     Status: None   Collection Time: 11/19/20 11:01 AM  Result Value Ref Range   MICRO NUMBER: [id number]    SPECIMEN QUALITY: Adequate    Sample Source URINE    STATUS: FINAL    Result: No Growth   REFLEXIVE URINE CULTURE     Status: None   Collection Time: 11/19/20 11:01 AM  Result Value Ref Range   REFLEXIVE URINE CULTURE      Comment: CULTURE INDICATED - RESULTS TO FOLLOW  reflex PSA, Free     Status: None   Collection Time: 11/19/20 11:01 AM  Result Value Ref Range   PSA, Free 2.6 ng/mL   PSA, % Free 34 >25 % (calc)    Comment: . PSA(ng/mL)      Free PSA(%)     Estimated(x) Probability                                      of Cancer(as%) 0-2.5              (*)               Approx. 1 2.6-4.0(1)         0-27(2)                   24(3) 4.1-10(4)          0-10  56                    11-15                     28                    16-20                     20                    21-25                     16                    >or =26                   8 >10(+)             N/A                      >50 . References:(1)Catalona et al.:Urology 60: 469-474 (2002)            (2)Catalona et al.:J.Urol 168: 922-925 (2002)               Free PSA(%)   Sensitivity(%)  Specificity(%)               < or = 25          85              19               < or = 30           93               9            (3)Catalona et al.:JAMA 277: 1452-1455 (1997)            (4)Catalona et al.:JAMA 279: 0175-1025 (1998) . (x)These estimates vary with age, ethnicity, family     history and DRE results. (*)The  diagnostic usefulness of % Free PSA has not been    established in patients with total PSA below 2.6 ng/mL (+)In men with PSA above 10 ng/mL, prostate cancer risk is    determined by total PSA alone. . The Total PSA value from this assay system is  standardized against the equimolar PSA standard.  The test result will be approximately 20% higher  when compared to the [facility] Total PSA  (Siemens assay). Comparison of serial PSA results  should be interpreted with this fact in mind. Marland Kitchen PSA was performed using the Beckman Coulter Immunoassay method. Values obtained from different assay methods cannot be used interchangeably. PSA levels, regardless of value, should not be interpreted as absolute evidence of the presence or absence of disease. .      Visit summary with medication list and pertinent instructions was printed for patient to review, patient was advised to alert [place] if any updates are needed. All questions at time of visit were answered - patient instructed to contact office with any additional concerns. ER/RTC precautions were reviewed with the patient and understanding verbalized.   Note: Total time spent 40 minutes, greater than 50% of the visit was spent face-to-face counseling and coordinating care for the following: The primary encounter diagnosis  was Pleural effusion likely cause of elevated WBC, ESR/Sed Rate, mild AKI, fatigue and weight loss --> pulm to direct admit. Diagnoses of Elevated PSA d/t age, BPH, ?urinary abnormality --> urology referral given smoking hx  and Hx Heavy tobacco smoker were also pertinent to this visit. Total time spent in this in-person encounter was 40 minutes performing my duty and my job as a physician: record  review, history-taking and assessment of patient, counseling patient and ensuring understanding of plan, coordinating care which may include but is not limited to further testing or referral. Any modifiers needed may be added by billing department as necessary if I have lapsed in doing so.  .     Please note: voice recognition software was used to produce this document, and typos may escape review. Please contact Dr. Sheppard Coil for any needed clarifications.    Follow up plan: Return for pending pulmonary admission .

## 2020-11-25 NOTE — Telephone Encounter (Signed)
Called and spoke with Dr. Chase Caller, advised him that according to Epic, the admission is pending.  Nothing further needed.

## 2020-11-25 NOTE — Patient Instructions (Addendum)
ICD-10-CM   1. Empyema of left pleural space ([place])  J86.9   2. Poor dentition  K08.9   3. Pulmonary emphysema, unspecified emphysema type ([place])  J43.9    Empyema     Most likely etiology here is infectious with cancer is a differential diagnosis.  Poor dentition is a risk factor.  Plan  - Will discuss with my inpatient colleague about direct admit to [place] for a procdure called thoracentesis with chest tube followed by administration of intra pleural fibrinolytics therapy  Please stay tuned about admission.  Denitiin  - needs opd dentist repeat eval  Emphysema  - try breztri samples  Followup   Based on admission

## 2020-11-25 NOTE — Telephone Encounter (Addendum)
Hi guys - [facility] (cc Dr Harrington Challenger)  Dr Dorris Carnes called. She knows this patient daughter personally. He has a empyema after covid vaccine. Needs to be seen this week in pulm clinic. [place] or [place]. I can see him ideally   Or any of the other providers. Probably will do a direct admit from office depending on situation but he is not a ER case  Let me know aSAP. My 10am 11/25/2020 is cancelleing. Also, you can add him for noon 11/25/2020  . I wil make sure CMA gets to lunch by 12.30 11/25/2020   Thanks  MR

## 2020-11-25 NOTE — Progress Notes (Signed)
OV 11/25/2020  Subjective:  Patient ID: Timothy Waller, male , DOB: [birth year] , age 87 y.o. , MRN: [id number] , ADDRESS: [address] [place] [id number] PCP Emeterio Reeve, DO Patient Care Team: Emeterio Reeve, DO as PCP - General (Osteopathic Medicine)  This Provider for this visit: Treatment Team:  Attending Provider: Brand Males, MD    11/25/2020 -   Chief Complaint  Patient presents with  . Consult    Cough with fatigue, CT showed [place] Effusion     HPI Timothy Waller 87 y.o. -urgent referral by Dr. Emeterio Reeve via Dr. Dorris Carnes cardiologist.  Patient is accompanied by his daughter Timothy Waller who works as a Marine scientist and Dr. Alan Ripper office.  Patient is former [place] known to have COPD not otherwise specified on Spiriva.  Quite functional and cognitively sharp.  Although he has lean body mass index.  It appears around September 21, 2020 when he got his Covid booster vaccine and shortly after that he started developing increased cough compared to baseline and increase sputum compared to baseline associated with significant weight loss and fatigue that is been progressive.  Overall failure to thrive pattern.  This resulted in his primary care physician initiating unexplained weight loss work-up.  November 23, 2020 he had a CT scan of the chest that shows large loculated empyema on the left side.  He says that approximately 10 days ago he developed sudden pleuritic chest pain upon returning from church and since then he has a gnawing pain in this area.  I personally visualized the imaging also showed it to the family and the patient.  He denies any syncope or vomiting or aspiration episodes.  He does see a dentist but has not had any dental extraction or regular follow-up in a while.  He denies any regular alcohol history.  There is no fever or night sweats.   CT Chest data  CT Chest W Contrast  Result Date: 11/23/2020 CLINICAL DATA:  Left chest/abdominal  pain, weight loss, fatigue EXAM: CT CHEST, ABDOMEN, AND PELVIS WITH CONTRAST TECHNIQUE: Multidetector CT imaging of the chest, abdomen and pelvis was performed following the standard protocol during bolus administration of intravenous contrast. CONTRAST:  70mL OMNIPAQUE IOHEXOL 300 MG/ML  SOLN COMPARISON:  Chest radiographs dated 06/12/2017 FINDINGS: CT CHEST FINDINGS Cardiovascular: The heart is normal in size. No pericardial effusion. No evidence of thoracic aortic aneurysm. Atherosclerotic calcifications of the aortic arch. Mild three-vessel coronary atherosclerosis. Mediastinum/Nodes: No suspicious mediastinal lymphadenopathy. Visualized thyroid is unremarkable. Lungs/Pleura: Moderate to large loculated left pleural effusion. Associated compressive atelectasis in the lingula and left lower lobe. Biapical pleural-parenchymal scarring. Mild centrilobular and paraseptal emphysematous changes, upper lung predominant. Bronchiectasis with faint peribronchovascular nodularity in the posterior right upper lobe, lateral right middle lobe, right lower lobe, and lingula (for example, series 4/image 57). This appearance is favored to be post infectious/inflammatory, possibly reflecting chronic atypical mycobacterial infection such as MAI. No suspicious/dominant pulmonary nodules. No pneumothorax. Musculoskeletal: No focal osseous lesions. CT ABDOMEN PELVIS FINDINGS Hepatobiliary: Liver is within normal limits. 2.0 cm gallstone (series 2/image 80). Gallbladder is decompressed, without associated inflammatory changes. Pancreas: Within normal limits. Spleen: Within normal limits. Adrenals/Urinary Tract: Adrenal glands are within normal limits. Bilateral renal cysts, measuring to 3.1 cm in the posterior right upper kidney (series 2/image 68). No hydronephrosis. Bladder is underdistended but unremarkable. Stomach/Bowel: Stomach is within normal limits. No evidence of bowel obstruction. Normal appendix (series 2/image 95).  Sigmoid diverticulosis, without  evidence of diverticulitis. Vascular/Lymphatic: No evidence of abdominal aortic aneurysm. Atherosclerotic calcifications of the abdominal aorta and branch vessels. No suspicious abdominopelvic lymphadenopathy. Reproductive: Prostatomegaly. Other: No abdominopelvic ascites. Musculoskeletal: Mild degenerative changes of the lumbar spine. IMPRESSION: Moderate to large loculated left pleural effusion. Associated compressive atelectasis in the lingula and left lower lobe. Bronchiectasis with faint peribronchovascular nodularity in the lungs bilaterally, favored to be post infectious/inflammatory, possibly reflecting chronic atypical mycobacterial infection such as MAI. Cholelithiasis, without associated inflammatory changes. Additional ancillary findings as above. Aortic Atherosclerosis (ICD10-I70.0) and Emphysema (ICD10-J43.9). Electronically Signed   By: Julian Hy M.D.   On: 11/23/2020 15:38   CT Abdomen Pelvis W Contrast  Result Date: 11/23/2020 CLINICAL DATA:  Left chest/abdominal pain, weight loss, fatigue EXAM: CT CHEST, ABDOMEN, AND PELVIS WITH CONTRAST TECHNIQUE: Multidetector CT imaging of the chest, abdomen and pelvis was performed following the standard protocol during bolus administration of intravenous contrast. CONTRAST:  44mL OMNIPAQUE IOHEXOL 300 MG/ML  SOLN COMPARISON:  Chest radiographs dated 06/12/2017 FINDINGS: CT CHEST FINDINGS Cardiovascular: The heart is normal in size. No pericardial effusion. No evidence of thoracic aortic aneurysm. Atherosclerotic calcifications of the aortic arch. Mild three-vessel coronary atherosclerosis. Mediastinum/Nodes: No suspicious mediastinal lymphadenopathy. Visualized thyroid is unremarkable. Lungs/Pleura: Moderate to large loculated left pleural effusion. Associated compressive atelectasis in the lingula and left lower lobe. Biapical pleural-parenchymal scarring. Mild centrilobular and paraseptal emphysematous changes,  upper lung predominant. Bronchiectasis with faint peribronchovascular nodularity in the posterior right upper lobe, lateral right middle lobe, right lower lobe, and lingula (for example, series 4/image 57). This appearance is favored to be post infectious/inflammatory, possibly reflecting chronic atypical mycobacterial infection such as MAI. No suspicious/dominant pulmonary nodules. No pneumothorax. Musculoskeletal: No focal osseous lesions. CT ABDOMEN PELVIS FINDINGS Hepatobiliary: Liver is within normal limits. 2.0 cm gallstone (series 2/image 80). Gallbladder is decompressed, without associated inflammatory changes. Pancreas: Within normal limits. Spleen: Within normal limits. Adrenals/Urinary Tract: Adrenal glands are within normal limits. Bilateral renal cysts, measuring to 3.1 cm in the posterior right upper kidney (series 2/image 68). No hydronephrosis. Bladder is underdistended but unremarkable. Stomach/Bowel: Stomach is within normal limits. No evidence of bowel obstruction. Normal appendix (series 2/image 95). Sigmoid diverticulosis, without evidence of diverticulitis. Vascular/Lymphatic: No evidence of abdominal aortic aneurysm. Atherosclerotic calcifications of the abdominal aorta and branch vessels. No suspicious abdominopelvic lymphadenopathy. Reproductive: Prostatomegaly. Other: No abdominopelvic ascites. Musculoskeletal: Mild degenerative changes of the lumbar spine. IMPRESSION: Moderate to large loculated left pleural effusion. Associated compressive atelectasis in the lingula and left lower lobe. Bronchiectasis with faint peribronchovascular nodularity in the lungs bilaterally, favored to be post infectious/inflammatory, possibly reflecting chronic atypical mycobacterial infection such as MAI. Cholelithiasis, without associated inflammatory changes. Additional ancillary findings as above. Aortic Atherosclerosis (ICD10-I70.0) and Emphysema (ICD10-J43.9). Electronically Signed   By: Julian Hy  M.D.   On: 11/23/2020 15:38      PFT  No flowsheet data found.     has a past medical history of Abnormal weight loss, COPD (chronic obstructive pulmonary disease) ([place]), Hyperlipidemia, and Osteoporosis (09/01/2010).   reports that he quit smoking about 4 weeks ago. His smoking use included cigarettes. He smoked 0.50 packs per day. He has never used smokeless tobacco.  Past Surgical History:  Procedure Laterality Date  . COLONOSCOPY    . INNER EAR SURGERY     hole in eardrum 30 years ago    No Known Allergies  Immunization History  Administered Date(s) Administered  . Fluad Quad(high Dose 65+) 07/03/2019, 07/13/2020  . Influenza,  High Dose Seasonal PF 06/12/2017, 07/19/2018  . Influenza,inj,Quad PF,6+ Mos 07/08/2016  . Influenza-Unspecified 07/24/2014, 07/22/2015  . PFIZER(Purple Top)SARS-COV-2 Vaccination 11/08/2019, 11/29/2019, 09/21/2020  . Pneumococcal Polysaccharide-23 10/24/2005  . Pneumococcal-Unspecified 08/24/2014  . Td 10/24/2005  . Tdap 10/28/2015  . Zoster 02/07/2014    Family History  Problem Relation Age of Onset  . Cancer Father        mouth cancer      Current Outpatient Medications:  .  albuterol (VENTOLIN HFA) 108 (90 Base) MCG/ACT inhaler, Inhale 2 puffs into the lungs every 6 (six) hours as needed for wheezing., Disp: 2 each, Rfl: 11 .  Ascorbic Acid (VITAMIN C) 1000 MG tablet, Take 1,000 mg by mouth daily., Disp: , Rfl:  .  atorvastatin (LIPITOR) 10 MG tablet, TAKE 1 TABLET BY MOUTH EVERY DAY/LABS FOR REFILLS, Disp: 90 tablet, Rfl: 2 .  benzonatate (TESSALON) 200 MG capsule, Take 1 capsule (200 mg total) by mouth 3 (three) times daily as needed for cough., Disp: 30 capsule, Rfl: 0 .  Budeson-Glycopyrrol-Formoterol (BREZTRI AEROSPHERE) 160-9-4.8 MCG/ACT AERO, Inhale 2 puffs into the lungs in the morning and at bedtime., Disp: 4.8 g, Rfl: 0 .  ciprofloxacin (CIPRO) 500 MG tablet, Take 1 tablet (500 mg total) by mouth daily with breakfast for 10  days., Disp: 10 tablet, Rfl: 0 .  COD LIVER OIL PO, Take by mouth., Disp: , Rfl:  .  ibuprofen (ADVIL) 600 MG tablet, Take 1 tablet (600 mg total) by mouth every 8 (eight) hours as needed., Disp: 30 tablet, Rfl: 0 .  Multiple Vitamins-Minerals (CENTRUM PO), Take by mouth., Disp: , Rfl:  .  Omega-3 Fatty Acids (FISH OIL) 1000 MG CAPS, Take by mouth., Disp: , Rfl:  .  tiotropium (SPIRIVA) 18 MCG inhalation capsule, Place 1 capsule (18 mcg total) into inhaler and inhale daily. FOR MAINTENANCE (Patient not taking: Reported on 11/25/2020), Disp: 90 capsule, Rfl: 3      Objective:   Vitals:   11/25/20 1225  BP: 102/74  Pulse: 88  Temp: 97.8 F (36.6 C)  TempSrc: Oral  SpO2: 92%  Weight: 120 lb 6.4 oz (54.6 kg)  Height: 5\' 8"  (1.727 m)    Estimated body mass index is 18.31 kg/m as calculated from the following:   Height as of this encounter: 5\' 8"  (1.727 m).   Weight as of this encounter: 120 lb 6.4 oz (54.6 kg).  @WEIGHTCHANGE @  [employer]   11/25/20 1225  Weight: 120 lb 6.4 oz (54.6 kg)     Physical Exam  General Appearance:    Alert, cooperative, no distress, appears stated age - yes , Deconditioned looking - yes , OBESE  - no, Sitting on Wheelchair -  no  Head:    Normocephalic, without obvious abnormality, atraumatic  Eyes:    PERRL, conjunctiva/corneas clear,  Ears:    Normal TM's and external ear canals, both ears  Nose:   Nares normal, septum midline, mucosa normal, no drainage    or sinus tenderness. OXYGEN ON  - no . Patient is @ no   Throat:   Lips, mucosa, and tongue normal; teeth and gums normal. Cyanosis on lips - no  Neck:   Supple, symmetrical, trachea midline, no adenopathy;    thyroid:  no enlargement/tenderness/nodules; no carotid   bruit or JVD  Back:     Symmetric, no curvature, ROM normal, no CVA tenderness  Lungs:     Distress - no , Wheeze no, Barrell Chest - YES, Purse  lip breathing - yes, Crackles - no.  significantly diminished air entry on the  left side with stony dullness   Chest Wall:    No tenderness or deformity.    Heart:    Regular rate and rhythm, S1 and S2 normal, no rub   or gallop, Murmur - NO  Breast Exam:    NOT DONE  Abdomen:     Soft, non-tender, bowel sounds active all four quadrants,    no masses, no organomegaly. Visceral obesity - no  Genitalia:   NOT DONE  Rectal:   NOT DONE  Extremities:   Extremities - normal, Has Cane - no, Clubbing - no, Edema - no  Pulses:   2+ and symmetric all extremities  Skin:   Stigmata of Connective Tissue Disease - no  Lymph nodes:   Cervical, supraclavicular, and axillary nodes normal  Psychiatric:  Neurologic:   Pleasant - yes, Anxious - no, Flat affect - no  CAm-ICU - neg, Alert and Oriented x 3 - yes, Moves all 4s - yes, Speech - normal, Cognition - intact          Assessment:       ICD-10-CM   1. Empyema of left pleural space (HCC)  J86.9   2. Poor dentition  K08.9   3. Pulmonary emphysema, unspecified emphysema type ([place])  J43.9        Plan:     Patient Instructions     ICD-10-CM   1. Empyema of left pleural space ([place])  J86.9   2. Poor dentition  K08.9   3. Pulmonary emphysema, unspecified emphysema type ([place])  J43.9    Empyema     Most likely etiology here is infectious with cancer is a differential diagnosis.  Poor dentition is a risk factor.  Plan  - Will discuss with my inpatient colleague about direct admit to [facility] for a procdure called thoracentesis with chest tube followed by administration of intra pleural fibrinolytics therapy   Please stay tuned about admission.  Denitiin  - needs opd dentist repeat eval  Emphysema  - try breztri samples  Followup   Based on admission  have requested an inpatient MedSurg bed at [facility].  Discussed with Dr. Ernest Mallick.  Pulmonary will be primary.  If he gets bed today than I will use this note to create his history and physical and admission orders.  Otherwise this note will be an  ambulatory note and the latter to have a fresh history and physical tomorrow 11/26/2020    SIGNATURE    Dr. Brand Males, M.D., F.C.C.P,  Pulmonary and Critical Care Medicine Staff Physician, [place] Director - Interstitial Lung Disease  Program  Pulmonary [place] at [place], [place], [address]  Pager: [phone], If no answer or between  15:00h - 7:00h: call [phone] Telephone: [phone]  1:13 PM 11/25/2020

## 2020-11-25 NOTE — H&P (Addendum)
NAME:  Timothy Waller, MRN:  [id number], DOB:  [birth year], LOS: 0 ADMISSION DATE:  11/25/20, CONSULTATION DATE:  11/25/20 REFERRING MD:  Emeterio Reeve, DO , CHIEF COMPLAINT:  Left lung empyema  Brief History:  Post covid booster failure to thrive and pleuritic pain resulting in CT diagnosis of loculated large left pleural effusion 11/23/20  History of Present Illness:  - notes indicate eval in office and direct admit to [facility] is an 87 y.o. male - urgent referral by Dr. Emeterio Reeve via Dr. Dorris Carnes cardiologist.  Patient is accompanied by his daughter [name] who works as a Marine scientist and Dr. Alan Ripper office.  Patient is former [place] known to have COPD not otherwise specified on Spiriva.  Quite functional and cognitively sharp.  Although he has lean body mass index.  It appears around September 21, 2020 when he got his Covid booster vaccine and shortly after that he started developing increased cough compared to baseline and increase sputum compared to baseline associated with significant weight loss and fatigue that is been progressive.  Overall failure to thrive pattern.  This resulted in his primary care physician initiating unexplained weight loss work-up.  November 23, 2020 he had a CT scan of the chest that shows large loculated empyema on the left side.  He says that approximately 10 days ago he developed sudden pleuritic chest pain upon returning from church and since then he has a gnawing pain in this area. I personally visualized the imaging also showed it to the family and the patient.  He denies any syncope or vomiting or aspiration episodes.  He does see a dentist but has not had any dental extraction or regular follow-up in a while.  He denies any regular alcohol history.  There is no fever or night sweats.       Past Medical History:    has a past medical history of Abnormal weight loss, COPD (chronic obstructive pulmonary disease) ([place]), Hyperlipidemia, and  Osteoporosis (09/01/2010).   reports that he quit smoking about 4 weeks ago. His smoking use included cigarettes. He smoked 0.50 packs per day. He has never used smokeless tobacco.  Past Surgical History:  Procedure Laterality Date  . COLONOSCOPY    . INNER EAR SURGERY     hole in eardrum 30 years ago    No Known Allergies  Immunization History  Administered Date(s) Administered  . Fluad Quad(high Dose 65+) 07/03/2019, 07/13/2020  . Influenza, High Dose Seasonal PF 06/12/2017, 07/19/2018  . Influenza,inj,Quad PF,6+ Mos 07/08/2016  . Influenza-Unspecified 07/24/2014, 07/22/2015  . PFIZER(Purple Top)SARS-COV-2 Vaccination 11/08/2019, 11/29/2019, 09/21/2020  . Pneumococcal Polysaccharide-23 10/24/2005  . Pneumococcal-Unspecified 08/24/2014  . Td 10/24/2005  . Tdap 10/28/2015  . Zoster 02/07/2014    Family History  Problem Relation Age of Onset  . Cancer Father        mouth cancer     No current facility-administered medications for this encounter.  Current Outpatient Medications:  .  albuterol (VENTOLIN HFA) 108 (90 Base) MCG/ACT inhaler, Inhale 2 puffs into the lungs every 6 (six) hours as needed for wheezing., Disp: 2 each, Rfl: 11 .  Ascorbic Acid (VITAMIN C) 1000 MG tablet, Take 1,000 mg by mouth daily., Disp: , Rfl:  .  atorvastatin (LIPITOR) 10 MG tablet, TAKE 1 TABLET BY MOUTH EVERY DAY/LABS FOR REFILLS, Disp: 90 tablet, Rfl: 2 .  benzonatate (TESSALON) 200 MG capsule, Take 1 capsule (200 mg total) by mouth 3 (three) times daily as  needed for cough., Disp: 30 capsule, Rfl: 0 .  Budeson-Glycopyrrol-Formoterol (BREZTRI AEROSPHERE) 160-9-4.8 MCG/ACT AERO, Inhale 2 puffs into the lungs in the morning and at bedtime., Disp: 4.8 g, Rfl: 0 .  ciprofloxacin (CIPRO) 500 MG tablet, Take 1 tablet (500 mg total) by mouth daily with breakfast for 10 days., Disp: 10 tablet, Rfl: 0 .  COD LIVER OIL PO, Take by mouth., Disp: , Rfl:  .  ibuprofen (ADVIL) 600 MG tablet, Take 1 tablet (600  mg total) by mouth every 8 (eight) hours as needed., Disp: 30 tablet, Rfl: 0 .  Multiple Vitamins-Minerals (CENTRUM PO), Take by mouth., Disp: , Rfl:  .  Omega-3 Fatty Acids (FISH OIL) 1000 MG CAPS, Take by mouth., Disp: , Rfl:  .  tiotropium (SPIRIVA) 18 MCG inhalation capsule, Place 1 capsule (18 mcg total) into inhaler and inhale daily. FOR MAINTENANCE (Patient not taking: Reported on 11/25/2020), Disp: 90 capsule, Rfl: 3   Significant Hospital Events:  11/25/20 - admit   Consults:  x  Procedures:    Significant Diagnostic Tests:  x  Micro Data:  *x  Antimicrobials:  x  Interim History / Subjective:   11/25/2020 - seen in *[facility] pulmonary office around 12.30  Objective    Vitals:   11/25/20 1225  BP: 102/74  Pulse: 88  Temp: 97.8 F (36.6 C)  TempSrc: Oral  SpO2: 92%  Weight: 120 lb 6.4 oz (54.6 kg)  Height: 5\' 8"  (1.727 m)   Examination: General: frail elderly male. Looks non toxi HENT: no neck nodes, no elevated jov Lungs: Barrell chest, no wheeze, some purse lip breathing, stonly dullness to left chest widespread with reduced air entry Cardiovascular: normal heart sounds . No murmur Abdomen: soft Extremities: cachectic. No cyanosis . No clubbing. No edema Neuro: axox3. Speech normal GU: not examined  Resolved Hospital Problem list   x  Assessment & Plan:   Empyema - large, left lung, loculated   Most likely etiology here is infectious with cancer is a differential diagnosis.  Poor dentition is a risk factor. Higher risk candidate for VATS given age, cachexia and copd. Also, recent evidence in favor of tube thoracostomy with intrapleural fibrinolytics as first step approach. No contraindication such as fistua or small space on CT  Plan  - Thoracentesis diagnostic with Pig tail tube thoracostomy -> might need and likely need course of intrapleural fibrinolytics - antibiotics - check serum labs - cbc, bmet, lft, mag, phos, ldh, pt, ptt  Denitiion  -  needs opd dentist repeat eval  Emphysema  - try breztri samples instead of spiriva.  - no evidence of flare up    Best practice (evaluated daily)  Diet: regular diet Pain/Anxiety/Delirium protocol (if indicated): x VAP protocol (if indicated): HOB > 30 DVT prophylaxis: heparin post procdure GI prophylaxis: na Glucose control: ssi Mobility: bed rest but also bathroom privileges and up and about Disposition:med surgw [facility]  Goals of Care:   Multi-Disciplinary Goals of Care Discussion Date of Discussion 11/25/2020 and is Day 0 since admit  Primary service for patient   Location of discussion   Family and Staff present   Summary of discussion Admit. Need to sort out code status  Followup goals of care due by   Misc comments if any      Family Updates: daughter and patient      SIGNATURE    Dr. Brand Males, M.D., F.C.C.P,  Pulmonary and Critical Care Medicine Staff Physician, [facility] Director -  Interstitial Lung Disease  Program  Pulmonary [place] at [place], [place], [address]  Pager: [phone], If no answer  OR between  19:00-7:00h: page [phone]  [phone] Telephone (clinical office): [phone] Telephone (research): [phone]  6:44 PM 11/25/2020    LABS    PULMONARY No results for input(s): PHART, PCO2ART, PO2ART, HCO3, TCO2, O2SAT in the last 168 hours.  Invalid input(s): PCO2, PO2  CBC Recent Labs  Lab 11/19/20 1101  HGB 11.8*  HCT 34.7*  WBC 16.6*  PLT 372    COAGULATION No results for input(s): INR in the last 168 hours.  CARDIAC  No results for input(s): TROPONINI in the last 168 hours. No results for input(s): PROBNP in the last 168 hours.   CHEMISTRY Recent Labs  Lab 11/19/20 1101  NA 139  K 4.4  CL 100  CO2 28  GLUCOSE 174*  BUN 47*  CREATININE 1.60*  CALCIUM 9.2   Estimated Creatinine Clearance: 26.5 mL/min (A) (by C-G formula based on SCr of  1.6 mg/dL (H)).   LIVER Recent Labs  Lab 11/19/20 1101  AST 29  ALT 32  BILITOT 0.5  PROT 6.1     INFECTIOUS No results for input(s): LATICACIDVEN, PROCALCITON in the last 168 hours.   ENDOCRINE CBG (last 3)  No results for input(s): GLUCAP in the last 72 hours.       IMAGING x48h  - image(s) personally visualized  -   highlighted in bold No results found.

## 2020-11-25 NOTE — Telephone Encounter (Signed)
Spoke w/ Dr Dorris Carnes (cardiology) who reportedly knows patient's daughter - she has also consulted w/ Dr Wille Glaser (pulmonary). Pulmonary recommended direct admission to the hospital. I will discus this plan with patient today when he comes in for appointment. Looks like Dr Lynford Citizen is also arranging for patient to be seen in his office today

## 2020-11-26 ENCOUNTER — Encounter (HOSPITAL_COMMUNITY): Payer: Self-pay | Admitting: Pulmonary Disease

## 2020-11-26 ENCOUNTER — Inpatient Hospital Stay (HOSPITAL_COMMUNITY): Payer: Medicare Other

## 2020-11-26 DIAGNOSIS — J90 Pleural effusion, not elsewhere classified: Secondary | ICD-10-CM | POA: Diagnosis not present

## 2020-11-26 DIAGNOSIS — D649 Anemia, unspecified: Secondary | ICD-10-CM | POA: Diagnosis not present

## 2020-11-26 LAB — CBC
HCT: 32.4 % — ABNORMAL LOW (ref 39.0–52.0)
Hemoglobin: 10 g/dL — ABNORMAL LOW (ref 13.0–17.0)
MCH: 28.2 pg (ref 26.0–34.0)
MCHC: 30.9 g/dL (ref 30.0–36.0)
MCV: 91.3 fL (ref 80.0–100.0)
Platelets: 359 10*3/uL (ref 150–400)
RBC: 3.55 MIL/uL — ABNORMAL LOW (ref 4.22–5.81)
RDW: 14.3 % (ref 11.5–15.5)
WBC: 15.6 10*3/uL — ABNORMAL HIGH (ref 4.0–10.5)
nRBC: 0 % (ref 0.0–0.2)

## 2020-11-26 LAB — GLUCOSE, PLEURAL OR PERITONEAL FLUID: Glucose, Fluid: <20 mg/dL

## 2020-11-26 LAB — BODY FLUID CELL COUNT WITH DIFFERENTIAL
Lymphs, Fluid: 3 %
Monocyte-Macrophage-Serous Fluid: 4 % — ABNORMAL LOW (ref 50–90)
Neutrophil Count, Fluid: 93 % — ABNORMAL HIGH (ref 0–25)
Total Nucleated Cell Count, Fluid: 11893 cu mm — ABNORMAL HIGH (ref 0–1000)

## 2020-11-26 LAB — BASIC METABOLIC PANEL
Anion gap: 11 (ref 5–15)
BUN: 34 mg/dL — ABNORMAL HIGH (ref 8–23)
CO2: 24 mmol/L (ref 22–32)
Calcium: 8.3 mg/dL — ABNORMAL LOW (ref 8.9–10.3)
Chloride: 104 mmol/L (ref 98–111)
Creatinine, Ser: 1.28 mg/dL — ABNORMAL HIGH (ref 0.61–1.24)
GFR, Estimated: 55 mL/min — ABNORMAL LOW (ref >60)
Glucose, Bld: 122 mg/dL — ABNORMAL HIGH (ref 70–99)
Potassium: 3.4 mmol/L — ABNORMAL LOW (ref 3.5–5.1)
Sodium: 139 mmol/L (ref 135–145)

## 2020-11-26 LAB — PHOSPHORUS: Phosphorus: 3.4 mg/dL (ref 2.5–4.6)

## 2020-11-26 LAB — ALBUMIN, PLEURAL OR PERITONEAL FLUID: Albumin, Fluid: 1.7 g/dL

## 2020-11-26 LAB — SEDIMENTATION RATE: Sed Rate: 130 mm/hr — ABNORMAL HIGH (ref 0–16)

## 2020-11-26 LAB — LACTATE DEHYDROGENASE, PLEURAL OR PERITONEAL FLUID: LD, Fluid: 2158 U/L — ABNORMAL HIGH (ref 3–23)

## 2020-11-26 LAB — PROTEIN, PLEURAL OR PERITONEAL FLUID: Total protein, fluid: 4.3 g/dL

## 2020-11-26 LAB — MAGNESIUM: Magnesium: 1.9 mg/dL (ref 1.7–2.4)

## 2020-11-26 LAB — MRSA PCR SCREENING: MRSA by PCR: NEGATIVE

## 2020-11-26 LAB — SARS CORONAVIRUS 2 (TAT 6-24 HRS): SARS Coronavirus 2: NEGATIVE

## 2020-11-26 LAB — ALBUMIN: Albumin: 2.2 g/dL — ABNORMAL LOW (ref 3.5–5.0)

## 2020-11-26 LAB — LACTATE DEHYDROGENASE: LDH: 136 U/L (ref 98–192)

## 2020-11-26 LAB — PROCALCITONIN: Procalcitonin: 0.52 ng/mL

## 2020-11-26 MED ORDER — STERILE WATER FOR INJECTION IJ SOLN
5.0000 mg | Freq: Every day | RESPIRATORY_TRACT | Status: DC
  Filled 2020-11-26: qty 5

## 2020-11-26 MED ORDER — SODIUM CHLORIDE 0.9% FLUSH
10.0000 mL | Freq: Three times a day (TID) | INTRAVENOUS | Status: DC
  Administered 2020-11-26 – 2020-11-29 (×8): 10 mL

## 2020-11-26 MED ORDER — VANCOMYCIN HCL IN DEXTROSE 1-5 GM/200ML-% IV SOLN
1000.0000 mg | INTRAVENOUS | Status: DC
  Administered 2020-11-26: 1000 mg via INTRAVENOUS
  Filled 2020-11-26: qty 200

## 2020-11-26 MED ORDER — IOHEXOL 300 MG/ML  SOLN
75.0000 mL | Freq: Once | INTRAMUSCULAR | Status: AC | PRN
  Administered 2020-11-26: 75 mL via INTRAVENOUS

## 2020-11-26 MED ORDER — ENSURE ENLIVE PO LIQD
237.0000 mL | Freq: Three times a day (TID) | ORAL | Status: DC
  Administered 2020-11-26 – 2020-11-28 (×6): 237 mL via ORAL

## 2020-11-26 MED ORDER — SODIUM CHLORIDE (PF) 0.9 % IJ SOLN
10.0000 mg | Freq: Every day | INTRAMUSCULAR | Status: DC
  Filled 2020-11-26: qty 10

## 2020-11-26 MED ORDER — SODIUM CHLORIDE 0.9 % IV SOLN
2.0000 g | Freq: Two times a day (BID) | INTRAVENOUS | Status: DC
  Administered 2020-11-26 – 2020-11-28 (×5): 2 g via INTRAVENOUS
  Filled 2020-11-26 (×7): qty 2

## 2020-11-26 MED ORDER — OXYCODONE HCL 5 MG PO TABS
5.0000 mg | ORAL_TABLET | Freq: Four times a day (QID) | ORAL | Status: DC | PRN
  Administered 2020-11-26 – 2020-11-28 (×6): 5 mg via ORAL
  Filled 2020-11-26 (×7): qty 1

## 2020-11-26 MED ORDER — METRONIDAZOLE IN NACL 5-0.79 MG/ML-% IV SOLN
500.0000 mg | Freq: Three times a day (TID) | INTRAVENOUS | Status: DC
  Administered 2020-11-26 – 2020-11-28 (×5): 500 mg via INTRAVENOUS
  Filled 2020-11-26 (×5): qty 100

## 2020-11-26 MED ORDER — SODIUM CHLORIDE 0.9% FLUSH
10.0000 mL | Freq: Three times a day (TID) | INTRAVENOUS | Status: DC
  Administered 2020-11-26 – 2020-11-29 (×8): 10 mL

## 2020-11-26 MED ORDER — ADULT MULTIVITAMIN W/MINERALS CH
1.0000 | ORAL_TABLET | Freq: Every day | ORAL | Status: DC
  Administered 2020-11-26 – 2020-11-29 (×4): 1 via ORAL
  Filled 2020-11-26 (×4): qty 1

## 2020-11-26 MED ORDER — ENSURE ENLIVE PO LIQD: 237.0000 mL | Freq: Two times a day (BID) | ORAL | Status: DC

## 2020-11-26 MED ORDER — POTASSIUM CHLORIDE CRYS ER 20 MEQ PO TBCR
20.0000 meq | EXTENDED_RELEASE_TABLET | Freq: Once | ORAL | Status: AC
  Administered 2020-11-26: 20 meq via ORAL
  Filled 2020-11-26: qty 1

## 2020-11-26 MED ORDER — STERILE WATER FOR INJECTION IJ SOLN
5.0000 mg | Freq: Every day | RESPIRATORY_TRACT | Status: DC
  Administered 2020-11-26: 5 mg via INTRAPLEURAL
  Filled 2020-11-26 (×2): qty 5

## 2020-11-26 MED ORDER — SODIUM CHLORIDE (PF) 0.9 % IJ SOLN
10.0000 mg | Freq: Every day | INTRAMUSCULAR | Status: DC
  Administered 2020-11-26: 10 mg via INTRAPLEURAL
  Filled 2020-11-26 (×2): qty 10

## 2020-11-26 NOTE — Progress Notes (Signed)
NAME:  Timothy Waller, MRN:  [id number], DOB:  [birth year], LOS: 1 ADMISSION DATE:  11/25/2020, CONSULTATION DATE:  2/2 REFERRING MD:  Dr. Sheppard Coil, CHIEF COMPLAINT:  Pleural Effusion    Brief History:  87 y/o M, former 15 pk year smoker (quit Dec 2021) who presented to [facility] on 2/2 as a direct admission for evaluation of pleural effusion.  The patient is followed by Dr. Sheppard Coil and Dr. Harrington Challenger.  His daughter is an Therapist, sports who works for Dr. Harrington Challenger.  The patient is a former Company secretary.  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 He remains functional and cognitively sharp.  The notes he has lost approximately 30 lbs in the last year without trying, he notes night sweats since Thanksgiving.  He associates issues starting after COVID booster on 09/21/20.  PCP was in the process of working up his failure to thrive pattern.  A CT of the chest was completed 1/31 which showed a large loculated appearing fluid collection on the left side.  He has not had pain or increased shortness of breath but he reports approximately 10 days ago that he had sudden left sided chest pain that resolved without intervention.  He denies hx of autoimmune disease, difficulty swallowing or choking after eating and infected / broken teeth.  He was admitted per PCCM 2/3 for evaluation of left effusion.   Past Medical History:  COPD - not defined  Tobacco Abuse - began smoking at age 44, up to 1.5 ppd, quit 09/2020 HLD  Weight Loss  Osteoporosis   Significant Hospital Events:  2/02 Admit  2/03 Chest tube placed for left effusion   Consults:    Procedures:  L CT 2/3 >>   Significant Diagnostic Tests:   Left Pleural Fluid 2/2 >>  Micro Data:  COVID 2/3 >> negative   Antimicrobials:  Vanco 2/3 >>  Zosyn 2/3 >>   Interim History / Subjective:  Pt reports he is tired, denies SOB Afebrile   Objective   Blood pressure 116/60, pulse 80, temperature (!) 97.5 F (36.4 C), temperature source Oral, resp.  rate (!) 22, height 5\' 8"  (1.727 m), weight 54.6 kg, SpO2 95 %.        Intake/Output Summary (Last 24 hours) at 11/26/2020 1042 Last data filed at 11/26/2020 4098 Gross per 24 hour  Intake 458.51 ml  Output 450 ml  Net 8.51 ml   Filed Weights   11/25/20 2045  Weight: 54.6 kg    Examination: General: adult male lying in bed in NAD   HEENT: MM pink/moist, fair dentition, mild temporal wasting, anicteric, no supraclavicular LAN Neuro: AAOx4, speech clear, MAE  CV: s1s2 RRR, no m/r/g PULM: mild accessory muscle use but no distress, on 2L [place] O2, diminished breath sounds on left, basilar crackles on right  GI: soft, bsx4 active  Extremities: warm/dry, no edema  Skin: no rashes or lesions  Resolved Hospital Problem list     Assessment & Plan:   Large Left Pleural Effusion  Noted tracheal deviation toward the effusion on plain film, ? Endobronchial lesion.  DDx includes aspiration, infection, and malignancy.  Less likely transudative process.  -place chest tube, risks / benefits explained to patient in detail  -doubt he would be a candidate for surgical intervention given overall deconditioning / cachexia  -follow up CXR post chest tube placement  -assess CT chest with contrast post placement to evaluate parenchyma  -clamp chest tube post placement at 1.5L drainage  to avoid negative pressure pulmonary edema -chest tube flush protocol in place  -continue empiric abx for now > likely can narrow quickly  -send pleural fluid for evaluation -water seal chest tube -chest tube care per protocol   COPD / Emphysema  COPD not defined with PFT's.   -will need formal evaluation  -no evidence of acute exacerbation  -PRN albuterol   HLD  -continue lipitor   Hypokalemia  -monitor, replace as indicated   Severe Protein Calorie Malnutrition / Cachexia  -ensure BID  -diet as tolerated   Best practice (evaluated daily)  Diet: as tolerated  Pain/Anxiety/Delirium protocol (if indicated):  n/a VAP protocol (if indicated): n/a  DVT prophylaxis: heparin  GI prophylaxis: n/a  Glucose control: n/a  Mobility: as tolerated  Disposition: pending  Goals of Care:  Last date of multidisciplinary goals of care discussion: n/a Family and staff present:  Summary of discussion:  Follow up goals of care discussion due:  Code Status: Full Code   Labs   CBC: Recent Labs  Lab 11/19/20 1101 11/26/20 0527  WBC 16.6* 15.6*  NEUTROABS 14,724*  --   HGB 11.8* 10.0*  HCT 34.7* 32.4*  MCV 85.7 91.3  PLT 372 818    Basic Metabolic Panel: Recent Labs  Lab 11/19/20 1101 11/26/20 0527  NA 139 139  K 4.4 3.4*  CL 100 104  CO2 28 24  GLUCOSE 174* 122*  BUN 47* 34*  CREATININE 1.60* 1.28*  CALCIUM 9.2 8.3*  MG  --  1.9  PHOS  --  3.4   GFR: Estimated Creatinine Clearance: 33.2 mL/min (A) (by C-G formula based on SCr of 1.28 mg/dL (H)). Recent Labs  Lab 11/19/20 1101 11/26/20 0527  WBC 16.6* 15.6*    Liver Function Tests: Recent Labs  Lab 11/19/20 1101  AST 29  ALT 32  BILITOT 0.5  PROT 6.1   No results for input(s): LIPASE, AMYLASE in the last 168 hours. No results for input(s): AMMONIA in the last 168 hours.  ABG No results found for: PHART, PCO2ART, PO2ART, HCO3, TCO2, ACIDBASEDEF, O2SAT   Coagulation Profile: Recent Labs  Lab 11/25/20 2118  INR 1.3*    Cardiac Enzymes: No results for input(s): CKTOTAL, CKMB, CKMBINDEX, TROPONINI in the last 168 hours.  HbA1C: Hgb A1c MFr Bld  Date/Time Value Ref Range Status  11/06/2018 09:47 AM 5.8 (H) <5.7 % of total Hgb Final    Comment:    For someone without known diabetes, a hemoglobin  A1c value between 5.7% and 6.4% is consistent with prediabetes and should be confirmed with a  follow-up test. . For someone with known diabetes, a value <7% indicates that their diabetes is well controlled. A1c targets should be individualized based on duration of diabetes, age, comorbid conditions, and  other considerations. . This assay result is consistent with an increased risk of diabetes. . Currently, no consensus exists regarding use of hemoglobin A1c for diagnosis of diabetes for children. .     CBG: No results for input(s): GLUCAP in the last 168 hours.    Critical care time:      Noe Gens, MSN, APRN, NP-C, AGACNP-BC [facility] 11/26/2020, 10:42 AM   Please see [email or web address] for pager details.   From 7A-7P if no response, please call [phone] After hours, please call ELink [phone]

## 2020-11-26 NOTE — Procedures (Signed)
Pleural Fibrinolytic Administration Procedure Note  LAVONNE CASS  [id number]  Mar 03, 1934  Date:11/26/20  Time:4:55 PM   Provider Performing:Seymour Pavlak Mamie Nick Carlis Abbott   Procedure: Pleural Fibrinolysis Initial day [id number])  Indication(s) Fibrinolysis of complicated pleural effusion  Consent Risks of the procedure as well as the alternatives and risks of each were explained to the patient and/or caregiver.  Consent for the procedure was obtained.   Anesthesia None   Time Out Verified patient identification, verified procedure, site/side was marked, verified correct patient position, special equipment/implants available, medications/allergies/relevant history reviewed, required imaging and test results available.   Sterile Technique Hand hygiene, gloves   Procedure Description Existing pleural catheter was cleaned and accessed in sterile manner.  10mg  of tPA in 30cc of saline and 5mg  of dornase in 30cc of sterile water were injected into pleural space using existing pleural catheter.  Catheter will be clamped for 1 hour and then placed back to water seal.   Complications/Tolerance None; patient tolerated the procedure well.   EBL None   Specimen(s) None  Julian Hy, DO 11/26/20 4:55 PM [facility]  From 7AM- 7PM if no response to pager, please call [phone]. After hours, 7PM- 7AM, please call Elink  [phone].

## 2020-11-26 NOTE — Procedures (Signed)
Insertion of Chest Tube Procedure Note  Timothy Waller  [id number]  Mar 23, 1934  Date:11/26/20  Time:10:03 AM    Provider Performing: Julian Hy   Procedure: Pleural Catheter Insertion w/ Imaging Guidance [id number])  Indication(s) Effusion  Consent Risks of the procedure as well as the alternatives and risks of each were explained to the patient and/or caregiver.  Consent for the procedure was obtained and is signed in the bedside chart  Anesthesia Topical only with 1% lidocaine    Time Out Verified patient identification, verified procedure, site/side was marked, verified correct patient position, special equipment/implants available, medications/allergies/relevant history reviewed, required imaging and test results available.   Sterile Technique Maximal sterile technique including full sterile barrier drape, hand hygiene, sterile gown, sterile gloves, mask, hair covering, sterile ultrasound probe cover (if used).   Procedure Description Ultrasound used to identify appropriate pleural anatomy for placement and overlying skin marked. Area of placement cleaned and draped in sterile fashion.  A 14 French pigtail pleural catheter was placed into the left pleural space using Seldinger technique. Appropriate return of fluid was obtained.  The tube was connected to atrium and placed on -20 cm H2O wall suction.   Complications/Tolerance None; patient tolerated the procedure well. Chest X-ray is ordered to verify placement.   EBL Minimal  Specimen(s) fluid- cytology, chemistries, culture, cell count with diff  Julian Hy, DO 11/26/20 10:04 AM [facility]

## 2020-11-26 NOTE — Progress Notes (Addendum)
Assessed at bedside post chest tube placement. Patient sleeping after pain medication administration.  Chest tube clamp opened after CT review. 1580ml in chamber (marked).  Pt previously complained of chest tube site pain.  Given oxycocone 5mg  x1 with good response.   Plan: -place tPA / dornase into chest tube to clear residual effusion -will need repeat CT imaging to ensure clearance of nodular appearing infiltrates at some point -PRN oxycodone for pain  Noe Gens, MSN, APRN, NP-C, AGACNP-BC [facility] 11/26/2020, 3:42 PM   Please see [email or web address] for pager details.   From 7A-7P if no response, please call [phone] After hours, please call ELink [phone]

## 2020-11-26 NOTE — Progress Notes (Signed)
Procedure completed, chest tube in place to water-seal. Patient alert and oriented x 4. Vital signs stable. No complications.

## 2020-11-26 NOTE — Progress Notes (Signed)
Initial Nutrition Assessment  DOCUMENTATION CODES:   Underweight  INTERVENTION:   -Ensure Enlive po TID, each supplement provides 350 kcal and 20 grams of protein  -Magic cup BID with meals, each supplement provides 290 kcal and 9 grams of protein  -Multivitamin with minerals daily  NUTRITION DIAGNOSIS:   Increased nutrient needs related to acute illness (pleural effusion) as evidenced by estimated needs.  GOAL:   Patient will meet greater than or equal to 90% of their needs  MONITOR:   PO intake,Supplement acceptance,Labs,Weight trends,I & O's  REASON FOR ASSESSMENT:   Consult,Malnutrition Screening Tool Assessment of nutrition requirement/status  ASSESSMENT:   87 y/o M, former 53 pk year smoker (quit Dec 2021) who presented to [facility] on 2/2 as a direct admission for evaluation of pleural effusion.  Patient not in room at time of visit. Had chest tube placed this AM and was having CT.  Per chart review, pt has been having increased weakness/fatigue since having COVID booster. CT scan on 1/31 confirmed pleural effusion.   Pt consumed 75% of breakfast this morning.  Per MD note on 1/27, pt typically eats a light breakfast of toast and juice, no lunch and then his largest meal is dinner. Pt has been drinking Ensure/Boost supplements.  Ensure has already been ordered, will increase to TID and add Magic cups with meals.   Per weight records, pt with no weight loss over at least the past year.   Medications: KLOR-CON, Lactated ringers  Labs reviewed: Low K  NUTRITION - FOCUSED PHYSICAL EXAM:  Out of room, will attempt at follow-up.  Diet Order:   Diet Order            Diet regular Room service appropriate? Yes; Fluid consistency: Thin  Diet effective now                 EDUCATION NEEDS:   Not appropriate for education at this time  Skin:  Skin Assessment: Reviewed RN Assessment  Last BM:  PTA  Height:   Ht Readings from Last 1 Encounters:  11/25/20  5\' 8"  (1.727 m)    Weight:   Wt Readings from Last 1 Encounters:  11/25/20 54.6 kg   BMI:  Body mass index is 18.3 kg/m.  Estimated Nutritional Needs:   Kcal:  1650-1850  Protein:  80-95g  Fluid:  1.9L/day  Clayton Bibles, MS, RD, LDN Inpatient Clinical Dietitian Contact information available via Amion

## 2020-11-26 NOTE — Progress Notes (Signed)
Pharmacy Antibiotic Note  Timothy Waller is a 87 y.o. male admitted on 11/25/2020 with sepsis.  Pharmacy has been consulted for vancomycin and Zosyn dosing.  Today, 11/26/2020:  Afebrile  WBC remain elevated but improved  SCr returned to baseline (~1.2)  Per CCM, will transition Zosyn to Cefepime to minimize nephrotoxicity with vanc  Plan:  Zosyn >> Cefepime 2g IV q12 hr  Increase vanc to 1g IV q36 hr (AUC 523 w/ SCr 1.28; Vd 0.72)  SCr q48 hr  F/u MRSA PCR and narrow vanc as appropriate   Height: 5\' 8"  (172.7 cm) Weight: 54.6 kg (120 lb 5.9 oz) IBW/kg (Calculated) : 68.4  Temp (24hrs), Avg:98 F (36.7 C), Min:97.5 F (36.4 C), Max:98.3 F (36.8 C)  Recent Labs  Lab 11/26/20 0527  WBC 15.6*  CREATININE 1.28*    Estimated Creatinine Clearance: 33.2 mL/min (A) (by C-G formula based on SCr of 1.28 mg/dL (H)).    No Known Allergies  Antimicrobials this admission: 2/2 vancomycin >>  2/2 Zosyn >> 2/3 Cefepime >>   Dose adjustments this admission: 2/3 incr vanc 500 q24 to 1000 q36  Microbiology results: 2/3 pleural fluid: pending 2/2 COVID: neg  Thank you for allowing pharmacy to be a part of this patient's care.   Reuel Boom, PharmD, BCPS [phone] 11/26/2020, 2:52 PM

## 2020-11-26 NOTE — Progress Notes (Signed)
Beginning of chest tube procedure. Patient is alert and oriented x4, procedural consent obtained, patient is on 2L [place] O2 Sats 98%. Time out completed as well. Patient stable, no complications.

## 2020-11-27 ENCOUNTER — Inpatient Hospital Stay (HOSPITAL_COMMUNITY): Payer: Medicare Other

## 2020-11-27 DIAGNOSIS — J90 Pleural effusion, not elsewhere classified: Secondary | ICD-10-CM | POA: Diagnosis not present

## 2020-11-27 DIAGNOSIS — J869 Pyothorax without fistula: Secondary | ICD-10-CM | POA: Diagnosis not present

## 2020-11-27 LAB — CBC
HCT: 29.2 % — ABNORMAL LOW (ref 39.0–52.0)
Hemoglobin: 8.9 g/dL — ABNORMAL LOW (ref 13.0–17.0)
MCH: 27.7 pg (ref 26.0–34.0)
MCHC: 30.5 g/dL (ref 30.0–36.0)
MCV: 91 fL (ref 80.0–100.0)
Platelets: 375 10*3/uL (ref 150–400)
RBC: 3.21 MIL/uL — ABNORMAL LOW (ref 4.22–5.81)
RDW: 14.3 % (ref 11.5–15.5)
WBC: 10.8 10*3/uL — ABNORMAL HIGH (ref 4.0–10.5)
nRBC: 0 % (ref 0.0–0.2)

## 2020-11-27 LAB — BASIC METABOLIC PANEL
Anion gap: 11 (ref 5–15)
BUN: 28 mg/dL — ABNORMAL HIGH (ref 8–23)
CO2: 25 mmol/L (ref 22–32)
Calcium: 8.3 mg/dL — ABNORMAL LOW (ref 8.9–10.3)
Chloride: 103 mmol/L (ref 98–111)
Creatinine, Ser: 1.02 mg/dL (ref 0.61–1.24)
GFR, Estimated: >60 mL/min (ref >60)
Glucose, Bld: 107 mg/dL — ABNORMAL HIGH (ref 70–99)
Potassium: 3.8 mmol/L (ref 3.5–5.1)
Sodium: 139 mmol/L (ref 135–145)

## 2020-11-27 LAB — CYTOLOGY - NON PAP

## 2020-11-27 LAB — PROCALCITONIN: Procalcitonin: 0.45 ng/mL

## 2020-11-27 MED ORDER — DORNASE ALFA 2.5 MG/2.5ML IN SOLN
5.0000 mg | RESPIRATORY_TRACT | Status: AC
  Administered 2020-11-27: 5 mg via INTRAPLEURAL
  Filled 2020-11-27 (×2): qty 5

## 2020-11-27 MED ORDER — SODIUM CHLORIDE (PF) 0.9 % IJ SOLN
10.0000 mg | INTRAMUSCULAR | Status: AC
  Administered 2020-11-27: 10 mg via INTRAPLEURAL
  Filled 2020-11-27 (×2): qty 10

## 2020-11-27 NOTE — Progress Notes (Addendum)
NAME:  Timothy Waller, MRN:  [id number], DOB:  Aug 01, 1934, LOS: 2 ADMISSION DATE:  11/25/2020, CONSULTATION DATE:  2/2 REFERRING MD:  Dr. Sheppard Coil, CHIEF COMPLAINT:  Pleural Effusion    Brief History:  87 y/o M, former 44 pk year smoker (quit Dec 2021) who presented to [facility] on 2/2 as a direct admission for evaluation of pleural effusion.  The patient is followed by Dr. Sheppard Coil and Dr. Harrington Challenger.  His daughter is an Therapist, sports who works for Dr. Harrington Challenger.  The patient is a former Company secretary.  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 He remains functional and cognitively sharp.  The notes he has lost approximately 30 lbs in the last year without trying, he notes night sweats since Thanksgiving.  He associates issues starting after COVID booster on 09/21/20.  PCP was in the process of working up his failure to thrive pattern.  A CT of the chest was completed 1/31 which showed a large loculated appearing fluid collection on the left side.  He has not had pain or increased shortness of breath but he reports approximately 10 days ago that he had sudden left sided chest pain that resolved without intervention.  He denies hx of autoimmune disease, difficulty swallowing or choking after eating and infected / broken teeth.  He was admitted per PCCM 2/3 for evaluation of left effusion.   Past Medical History:  COPD - not defined  Tobacco Abuse - began smoking at age 32, up to 1.5 ppd, quit 09/2020 HLD  Weight Loss  Osteoporosis   Significant Hospital Events:  2/02 Admit  2/03 Chest tube placed for left effusion   Consults:    Procedures:  L CT 2/3 >>   Significant Diagnostic Tests:   Left Pleural Fluid 2/2 >> exudate, LD 2,158, protein 4.3, TNC 11,893  Pleural Cytology 2/2 >>   Micro Data:  COVID 2/3 >> negative  MRSA PCR 2/3 >> negative Pleural Fluid Culture 2/3 >> moderate WBC >>   Antimicrobials:  Vanco 2/3 >> 2/4 Zosyn 2/3 >> 2/3  Cefepime 2/3 >>  Flagyl 2/3 >>   Interim  History / Subjective:  Pt resting in bed, no acute complaints On [place] O2  Glucose range 107 -122  Afebrile / WBC 10.8 I/O 925 ml UOP, chest tube 2.4L  Objective   Blood pressure (!) 117/51, pulse 75, temperature 98.2 F (36.8 C), temperature source Oral, resp. rate 12, height 5\' 8"  (1.727 m), weight 54.6 kg, SpO2 96 %.        Intake/Output Summary (Last 24 hours) at 11/27/2020 1023 Last data filed at 11/27/2020 0630 Gross per 24 hour  Intake 827.88 ml  Output 3315 ml  Net -2487.12 ml   Filed Weights   11/25/20 2045  Weight: 54.6 kg    Examination: General: frail elderly male lying in bed in NAD HEENT: MM pink/moist, fair dentition, anicteric Neuro: AAOx4, speech clear, MAE CV: s1s2 RRR, no m/r/g PULM: non-labored on [place] O2, lungs bilaterally clear anterior, diminished on left with crackles posterior, clear on right GI: soft, bsx4 active  Extremities: warm/dry, no edema, muscle wasting  Skin: no rashes or lesions  CXR 2/4 >> images personally reviewed, improved aeration on left, chest tube remains in good position   Resolved Hospital Problem list     Assessment & Plan:   Large Left Exudative Pleural Effusion  Left Pulmonary Nodules Noted tracheal deviation toward the effusion on plain film, ? Endobronchial lesion.  DDx  includes aspiration, infection, and malignancy.  Less likely transudative process.  -repeat tPA / dornase 2/4 -plan for follow up CT on 2/5 with possible removal of chest tube  -monitor chest tube drainage -continue empiric abx for now, D2/x abx   -follow cultures / cytology  -chest tube care per protocol  -oxycodone PRN pain  COPD / Emphysema  COPD not defined with PFT's.   -will need outpatient evaluation once recovered from acute illness   -PRN albuterol  -no evidence of exacerbation   Anemia  Hx colonoscopy in last year that was normal. Suspect some degree of hemoconcentration on admit + critical illness with empyema.  -trend CBC  -monitor for  bleeding  HLD  -continue lipitor   Hypokalemia  -monitor, replace as indicated   Severe Protein Calorie Malnutrition / Cachexia  -ensure BID  -diet as tolerated -MVI   Best practice (evaluated daily)  Diet: as tolerated  Pain/Anxiety/Delirium protocol (if indicated): n/a VAP protocol (if indicated): n/a  DVT prophylaxis: heparin  GI prophylaxis: n/a  Glucose control: n/a  Mobility: as tolerated  Disposition: Floor  Daughter Webb Silversmith [phone]) updated on plan of care 2/4 in detail  Goals of Care:  Last date of multidisciplinary goals of care discussion: n/a Family and staff present:  Summary of discussion:  Follow up goals of care discussion due:  Code Status: Full Code   Labs   CBC: Recent Labs  Lab 11/26/20 0527 11/27/20 0530  WBC 15.6* 10.8*  HGB 10.0* 8.9*  HCT 32.4* 29.2*  MCV 91.3 91.0  PLT [id number]    Basic Metabolic Panel: Recent Labs  Lab 11/26/20 0527 11/27/20 0530  NA 139 139  K 3.4* 3.8  CL 104 103  CO2 24 25  GLUCOSE 122* 107*  BUN 34* 28*  CREATININE 1.28* 1.02  CALCIUM 8.3* 8.3*  MG 1.9  --   PHOS 3.4  --    GFR: Estimated Creatinine Clearance: 41.6 mL/min (by C-G formula based on SCr of 1.02 mg/dL). Recent Labs  Lab 11/26/20 0527 11/26/20 1157 11/27/20 0530  PROCALCITON  --  0.52 0.45  WBC 15.6*  --  10.8*    Liver Function Tests: Recent Labs  Lab 11/26/20 0539  ALBUMIN 2.2*   No results for input(s): LIPASE, AMYLASE in the last 168 hours. No results for input(s): AMMONIA in the last 168 hours.  ABG No results found for: PHART, PCO2ART, PO2ART, HCO3, TCO2, ACIDBASEDEF, O2SAT   Coagulation Profile: Recent Labs  Lab 11/25/20 2118  INR 1.3*    Cardiac Enzymes: No results for input(s): CKTOTAL, CKMB, CKMBINDEX, TROPONINI in the last 168 hours.  HbA1C: Hgb A1c MFr Bld  Date/Time Value Ref Range Status  11/06/2018 09:47 AM 5.8 (H) <5.7 % of total Hgb Final    Comment:    For someone without known diabetes, a  hemoglobin  A1c value between 5.7% and 6.4% is consistent with prediabetes and should be confirmed with a  follow-up test. . For someone with known diabetes, a value <7% indicates that their diabetes is well controlled. A1c targets should be individualized based on duration of diabetes, age, comorbid conditions, and other considerations. . This assay result is consistent with an increased risk of diabetes. . Currently, no consensus exists regarding use of hemoglobin A1c for diagnosis of diabetes for children. .     CBG: No results for input(s): GLUCAP in the last 168 hours.    Critical care time:      Noe Gens, MSN, APRN,  NP-C, AGACNP-BC [facility] 11/27/2020, 10:23 AM   Please see [email or web address] for pager details.   From 7A-7P if no response, please call [phone] After hours, please call Warren Lacy [phone]   Pulmonary critical care attending:  This is a 87 year old gentleman, former smoker, 60-pack-year history quit in December 2021.  Patient admitted for a large left-sided exudative pleural effusion.  History of COPD and emphysema.  Also has a left lower lobe pulmonary nodule that is present post drainage of large effusion.  Currently on guard going TPA and DNase instillation.  BP (!) 117/51   Pulse 75   Temp 98.2 F (36.8 C) (Oral)   Resp 12   Ht 5\' 8"  (1.727 m)   Wt 54.6 kg   SpO2 96%   BMI 18.30 kg/m   General: Elderly male resting in bed no distress HEENT: Tracking appropriately Heart: Regular rhythm S1-S2 Lungs: Diminished breath sounds in the left lower lobe compared to the right, chest drain in place  Labs: Reviewed elevated white count low glucose likely complicated parapneumonic effusion.  Assessment: Large left-sided exudative pleural effusion Likely complicated parapneumonic effusion Cultures remain negative Left lower lobe pulmonary nodule COPD no prior PFTs with evidence centrilobular emphysema. Severe protein  calorie malnutrition and cachexia.  Plan: Repeat TPA and dornase today. We will have repeat CT chest tomorrow. If the pleural effusion is predominantly resolved based on output will consider removal of drain. As needed oxycodone for pain. Continue as needed albuterol PT OT   Garner Nash, DO [facility] 11/27/2020 2:22 PM

## 2020-11-27 NOTE — Plan of Care (Incomplete)
Patient is alert and oriented. Patient has chest Tube inserted that is H2O sealed with out put for this shiftPatient has been calm, cooperative, and very responsive today. He continues to experience pain when coughing but reports that it has improved. Report to nurse RN  Problem: Activity: Goal: Risk for activity intolerance will decrease Outcome: Progressing   Problem: Pain Managment: Goal: General experience of comfort will improve Outcome: Progressing   Problem: Skin Integrity: Goal: Risk for impaired skin integrity will decrease Outcome: Progressing

## 2020-11-27 NOTE — Evaluation (Signed)
Occupational Therapy Evaluation Patient Details Name: Timothy Waller MRN: [id number] DOB: 1934-08-31 Today's Date: 11/27/2020    History of Present Illness Patient is an 87 year old male admitted post covid booster failure to thrive and pleuritic pain resulting in CT diagnosis of loculated large left pleural effusion 11/23/20. S/p pleural catheter insertion and pleural fibrinolysis 2/3   Clinical Impression   Patient lives at home with spouse in single level house with level entry and is fully I at baseline. Pt does ADL/IADLs, spouse is physically limited 2* osteoporosis/back pain. Currently patient is supervision level for OOB ADLs due to decreased activity tolerance and pain d/t chest tube. Recommend continued acute OT services to maximize endurance and further education re: energy conservation strategies in order to facilitate D/C home.    Follow Up Recommendations  Home health OT;Other (comment) (vs no f/u pending progress)    Equipment Recommendations  None recommended by OT       Precautions / Restrictions Precautions Precaution Comments: chest tube Restrictions Weight Bearing Restrictions: No      Mobility Bed Mobility Overal bed mobility: Modified Independent                  Transfers Overall transfer level: Needs assistance Equipment used: None Transfers: Sit to/from Stand Sit to Stand: Supervision         General transfer comment: S for safety    Balance Overall balance assessment: Mild deficits observed, not formally tested                                         ADL either performed or assessed with clinical judgement   ADL Overall ADL's : Needs assistance/impaired Eating/Feeding: Independent   Grooming: Oral care;Supervision/safety;Standing   Upper Body Bathing: Set up;Sitting   Lower Body Bathing: Supervison/ safety;Sit to/from stand Lower Body Bathing Details (indicate cue type and reason): x1 loss of balance back onto EOB  in standing during wash up Upper Body Dressing : Set up;Sitting   Lower Body Dressing: Supervision/safety;Set up;Sit to/from stand Lower Body Dressing Details (indicate cue type and reason): patient able to doff briefs and don clean pair without physical assistance, note some posterior leaning against bed for support Toilet Transfer: Supervision/safety;Ambulation Toilet Transfer Details (indicate cue type and reason): simulated with functional ambulation in room, no loss of balance walking to/from sink. no physical assistance to power up to standing Toileting- Clothing Manipulation and Hygiene: Supervision/safety;Sit to/from stand       Functional mobility during ADLs: Supervision/safety General ADL Comments: patient appears close to his baseline with self care, decreased activity tolerance and pain with mobility d/t chest tube site                  Pertinent Vitals/Pain Pain Assessment: Faces Faces Pain Scale: Hurts even more Pain Location: chest tube site with mobility Pain Descriptors / Indicators: Grimacing Pain Intervention(s): Monitored during session     Hand Dominance Right   Extremity/Trunk Assessment Upper Extremity Assessment Upper Extremity Assessment: Overall WFL for tasks assessed   Lower Extremity Assessment Lower Extremity Assessment: Defer to PT evaluation   Cervical / Trunk Assessment Cervical / Trunk Assessment: Normal   Communication Communication Communication: No difficulties   Cognition Arousal/Alertness: Awake/alert Behavior During Therapy: WFL for tasks assessed/performed Overall Cognitive Status: Within Functional Limits for tasks assessed  Home Living Family/patient expects to be discharged to:: Private residence Living Arrangements: Spouse/significant other Available Help at Discharge: Family Type of Home: House Home Access: Level entry     [place]: One level      Bathroom Shower/Tub: Occupational psychologist: [place]: Environmental consultant - 2 wheels;Cane - single point;Shower seat;Grab bars - tub/shower          Prior Functioning/Environment Level of Independence: Independent        Comments: does "everything" pt's spouse has osteoporosis and back pain        OT Problem List: Decreased activity tolerance;Pain;Impaired balance (sitting and/or standing)      OT Treatment/Interventions: Self-care/ADL training;Energy conservation;DME and/or AE instruction;Therapeutic activities;Patient/family education;Balance training    OT Goals(Current goals can be found in the care plan section) Acute Rehab OT Goals Patient Stated Goal: home OT Goal Formulation: With patient Time For Goal Achievement: 12/11/20 Potential to Achieve Goals: Good  OT Frequency: Min 2X/week    AM-PAC OT "6 Clicks" Daily Activity     Outcome Measure Help from another person eating meals?: None Help from another person taking care of personal grooming?: A Little Help from another person toileting, which includes using toliet, bedpan, or urinal?: A Little Help from another person bathing (including washing, rinsing, drying)?: A Little Help from another person to put on and taking off regular upper body clothing?: A Little Help from another person to put on and taking off regular lower body clothing?: A Little 6 Click Score: 19   End of Session Equipment Utilized During Treatment: Oxygen Nurse Communication: Other (comment) (RN present during treatment)  Activity Tolerance: Patient tolerated treatment well Patient left: in bed;with call bell/phone within reach;with nursing/sitter in room;with family/visitor present  OT Visit Diagnosis: Pain Pain - Right/Left: Left Pain - part of body:  (lower back/flank)                Time: 9147-8295 OT Time Calculation (min): 34 min Charges:  OT General Charges $OT Visit: 1 Visit OT Evaluation $OT Eval  Moderate Complexity: 1 Mod OT Treatments $Self Care/Home Management : 8-22 mins  Delbert Phenix OT OT pager: [place] 11/27/2020, 12:26 PM

## 2020-11-27 NOTE — Progress Notes (Signed)
PT Cancellation Note  Patient Details Name: Timothy Waller MRN: [id number] DOB: 1934-04-26   Cancelled Treatment:    Reason Eval/Treat Not Completed: Patient not medically ready (Spoke with NP, pt just began tPA / dornase and will need to be flat and rotate in bed with RN staff assistance. Will follow up at later date/time as pt able and schedule allows.)   Verner Mould, DPT [place] [facility] [phone] Pager [phone]

## 2020-11-27 NOTE — Evaluation (Addendum)
Physical Therapy Evaluation Patient Details Name: Timothy Waller MRN: [id number] DOB: [birth year] Today's Date: 11/27/2020   History of Present Illness  Patient is an 87 year old male admitted post covid booster failure to thrive and pleuritic pain resulting in CT diagnosis of loculated large left pleural effusion 11/23/20. S/p pleural catheter insertion and pleural fibrinolysis 2/3  Clinical Impression  Pt is an 87 y.o. male with above HPI. Pt reports that he is independent with mobility at baseline. Pt required MIN guard and verbal cues for safety with sit to stand transfers. Pt required MIN assist- MIN guard for ambulation 372ft with no LOB observed. Pt was able to safely ambulate 44ft in room with supervision. Pt will benefit from skilled PT to increase independence and safety with mobility. Acute therapy to follow up during stay.      Follow Up Recommendations Outpatient PT    Equipment Recommendations  None recommended by PT (pt has rollator at home)    Recommendations for Other Services       Precautions / Restrictions Precautions Precaution Comments: chest tube Restrictions Weight Bearing Restrictions: No      Mobility  Bed Mobility               General bed mobility comments: OOB in recliner    Transfers Overall transfer level: Needs assistance Equipment used: Rolling walker (2 wheeled) Transfers: Sit to/from Stand Sit to Stand: Min guard         General transfer comment: MIN guard for safety with cues for safe hand placement  Ambulation/Gait Ambulation/Gait assistance: Min assist;Min guard;Supervision Gait Distance (Feet): 300 Feet Assistive device: Rolling walker (2 wheeled) Gait Pattern/deviations: Step-through pattern Gait velocity: fair   General Gait Details: MIN assist for safety when pt had to cough as this caused increased pain and slight decrease in balance. Pt displayed intermittently irradic path when managing RW, no LOB observed. MIN guard  for safety during ambulation with assist for line management. Pt able to safely ambulate 78ft with supervision. Pt 02 remained >92% on 3L [place].  Stairs            Wheelchair Mobility    Modified Rankin (Stroke Patients Only)       Balance Overall balance assessment: Mild deficits observed, not formally tested                                           Pertinent Vitals/Pain Pain Assessment: Faces Faces Pain Scale: Hurts little more Pain Location: chest tube site with mobility and L shoulder pressure Pain Descriptors / Indicators: Grimacing;Sore;Pressure Pain Intervention(s): Monitored during session;Repositioned;Limited activity within patient's tolerance    Home Living Family/patient expects to be discharged to:: Private residence Living Arrangements: Spouse/significant other Available Help at Discharge: Family Type of Home: House Home Access: Level entry     Home Layout: One level Home Equipment: Shower seat;Grab bars - tub/shower;Walker - 4 wheels;Cane - single point Additional Comments: pt's wife is available for supervision at home and his daughter is available intermittently.    Prior Function Level of Independence: Independent         Comments: does "everything" pt's spouse has osteoporosis and back pain     Hand Dominance   Dominant Hand: Right    Extremity/Trunk Assessment   Upper Extremity Assessment Upper Extremity Assessment: Overall WFL for tasks assessed    Lower Extremity Assessment Lower  Extremity Assessment: Generalized weakness    Cervical / Trunk Assessment Cervical / Trunk Assessment: Normal  Communication   Communication: No difficulties  Cognition Arousal/Alertness: Awake/alert Behavior During Therapy: WFL for tasks assessed/performed Overall Cognitive Status: Within Functional Limits for tasks assessed                                        General Comments      Exercises      Assessment/Plan    PT Assessment Patient needs continued PT services  PT Problem List Decreased strength;Decreased activity tolerance;Decreased balance;Decreased mobility;Decreased knowledge of use of DME;Pain       PT Treatment Interventions DME instruction;Gait training;Functional mobility training;Therapeutic activities;Therapeutic exercise;Balance training;Patient/family education    PT Goals (Current goals can be found in the Care Plan section)  Acute Rehab PT Goals Patient Stated Goal: to go home PT Goal Formulation: With patient/family Time For Goal Achievement: 12/11/20 Potential to Achieve Goals: Good    Frequency Min 3X/week   Barriers to discharge        Co-evaluation               AM-PAC PT "6 Clicks" Mobility  Outcome Measure Help needed turning from your back to your side while in a flat bed without using bedrails?: None Help needed moving from lying on your back to sitting on the side of a flat bed without using bedrails?: A Little Help needed moving to and from a bed to a chair (including a wheelchair)?: A Little Help needed standing up from a chair using your arms (e.g., wheelchair or bedside chair)?: A Little Help needed to walk in hospital room?: A Little Help needed climbing 3-5 steps with a railing? : A Little 6 Click Score: 19    End of Session Equipment Utilized During Treatment: Gait belt Activity Tolerance: Patient tolerated treatment well Patient left: in chair;with call bell/phone within reach;with family/visitor present Nurse Communication: Mobility status PT Visit Diagnosis: Unsteadiness on feet (R26.81);Muscle weakness (generalized) (M62.81);Pain Pain - Right/Left: Left Pain - part of body: Shoulder    Time: 3419-3790 PT Time Calculation (min) (ACUTE ONLY): 27 min   Charges:   PT Evaluation $PT Eval Moderate Complexity: 1 Mod PT Treatments $Gait Training: 8-22 mins       Lauren Youngblood, SPT  Acute rehab    Lauren  Youngblood 11/27/2020, 6:23 PM

## 2020-11-27 NOTE — Procedures (Signed)
Pleural Fibrinolytic Administration Procedure Note  DRAY DENTE  [id number]  May 20, 1934  Date:11/27/20  Time:12:05 PM   Provider Performing:Breckin Savannah Alfredo Martinez, AG-ACNP-BC, NP-C  Procedure: Pleural Fibrinolysis Subsequent day (39767)  Indication(s) Fibrinolysis of complicated pleural effusion  Consent Risks of the procedure as well as the alternatives and risks of each were explained to the patient and/or caregiver.  Consent for the procedure was obtained.   Anesthesia None   Time Out Verified patient identification, verified procedure, site/side was marked, verified correct patient position, special equipment/implants available, medications/allergies/relevant history reviewed, required imaging and test results available.   Sterile Technique Hand hygiene, gloves   Procedure Description Existing pleural catheter was cleaned and accessed in sterile manner.  10mg  of tPA in 30cc of saline and 5mg  of dornase in 30cc of sterile water were injected into pleural space using existing pleural catheter.  Catheter will be clamped for 1 hour and then placed back to suction.   Complications/Tolerance None; patient tolerated the procedure well.  EBL None   Specimen(s) None   Noe Gens, MSN, APRN, NP-C, AGACNP-BC [facility] 11/27/2020, 12:05 PM   Please see [email or web address] for pager details.   From 7A-7P if no response, please call [phone] After hours, please call ELink [phone]

## 2020-11-28 ENCOUNTER — Inpatient Hospital Stay (HOSPITAL_COMMUNITY): Payer: Medicare Other

## 2020-11-28 DIAGNOSIS — J90 Pleural effusion, not elsewhere classified: Secondary | ICD-10-CM | POA: Diagnosis not present

## 2020-11-28 DIAGNOSIS — J869 Pyothorax without fistula: Secondary | ICD-10-CM | POA: Diagnosis not present

## 2020-11-28 LAB — BASIC METABOLIC PANEL
Anion gap: 9 (ref 5–15)
BUN: 28 mg/dL — ABNORMAL HIGH (ref 8–23)
CO2: 27 mmol/L (ref 22–32)
Calcium: 8.4 mg/dL — ABNORMAL LOW (ref 8.9–10.3)
Chloride: 105 mmol/L (ref 98–111)
Creatinine, Ser: 1.16 mg/dL (ref 0.61–1.24)
GFR, Estimated: >60 mL/min (ref >60)
Glucose, Bld: 85 mg/dL (ref 70–99)
Potassium: 4.1 mmol/L (ref 3.5–5.1)
Sodium: 141 mmol/L (ref 135–145)

## 2020-11-28 LAB — CBC
HCT: 29.2 % — ABNORMAL LOW (ref 39.0–52.0)
Hemoglobin: 8.9 g/dL — ABNORMAL LOW (ref 13.0–17.0)
MCH: 27.8 pg (ref 26.0–34.0)
MCHC: 30.5 g/dL (ref 30.0–36.0)
MCV: 91.3 fL (ref 80.0–100.0)
Platelets: 443 10*3/uL — ABNORMAL HIGH (ref 150–400)
RBC: 3.2 MIL/uL — ABNORMAL LOW (ref 4.22–5.81)
RDW: 14.3 % (ref 11.5–15.5)
WBC: 8.9 10*3/uL (ref 4.0–10.5)
nRBC: 0 % (ref 0.0–0.2)

## 2020-11-28 LAB — PROCALCITONIN: Procalcitonin: 0.48 ng/mL

## 2020-11-28 MED ORDER — METRONIDAZOLE 500 MG PO TABS
500.0000 mg | ORAL_TABLET | Freq: Three times a day (TID) | ORAL | Status: DC
  Administered 2020-11-28 – 2020-11-29 (×3): 500 mg via ORAL
  Filled 2020-11-28 (×3): qty 1

## 2020-11-28 NOTE — Discharge Summary (Addendum)
Physician Discharge Summary  Patient ID: Odin Mariani Camire MRN: [id number] DOB/AGE: 07-01-1934 87 y.o.  Admit date: 11/25/2020 Discharge date: 11/29/2020   Discharge Diagnoses:  -Large Left Exudative Pleural Effusion  -Left Pulmonary Nodules -COPD / Emphysema  -Anemia  -HLD  -Hypokalemia  -Severe Protein Calorie Malnutrition / Cachexia                 DISCHARGE PLAN BY DIAGNOSIS    Large Left Exudative Pleural Effusion  Left Pulmonary Nodules -Noted tracheal deviation toward the effusion on plain film, ? Endobronchial lesion.   -DDx includes aspiration, infection, and malignancy.  Less likely transudative process.  -Near complete resolution of left pleural effusion and adjacent airspace disease seen on chest CT 2/5 P: SP Pigtail chest tube with tPA/dornase  He needs outpatient follow-up with pulmonary at discharge to see Dr. Tonia Brooms to ensure resolution of lower lobe spiculated nodule Patient will have follow-up with me in clinic repeat images. We will see him in clinic either at the end of February/1 March. He will need repeat images to make sure that his pleural effusion has resolved as well.  COPD / Emphysema  -COPD not defined with PFT's.   P: As above will need outpatient pulmonary follow up with repeat PFTs Continue BDs Discharged on home inhaler regimen.  Anemia  -Hx colonoscopy in last year that was normal. Suspect some degree of hemoconcentration on admit + critical illness with empyema.  P: No sign of bleeding Hgb stable at 8.9  HLD  P: Continue statin at discharge   Hypokalemia  P: Resolved  Severe Protein Calorie Malnutrition / Cachexia  P: Encourage high protein at discharge  Would recommend a daily boost supplement                    DISCHARGE SUMMARY   87 y/o M, former 79 pk year smoker (quit Dec 2021) who presented to [facility] on 2/2 as a direct admission for evaluation of pleural effusion.  The patient is followed by Dr. Lyn Hollingshead and Dr. Tenny Craw.   His daughter is an Charity fundraiser who works for Dr. Tenny Craw.  The patient is a former Arts development officer.  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 He remains functional and cognitively sharp.  The notes he has lost approximately 30 lbs in the last year without trying, he notes night sweats since Thanksgiving.  He associates issues starting after COVID booster on 09/21/20.  PCP was in the process of working up his failure to thrive pattern.  A CT of the chest was completed 1/31 which showed a large loculated appearing fluid collection on the left side.  He has not had pain or increased shortness of breath but he reports approximately 10 days ago that he had sudden left sided chest pain that resolved without intervention.  He denies hx of autoimmune disease, difficulty swallowing or choking after eating and infected / broken teeth.  He was admitted per PCCM 2/3 for evaluation of left effusion.   Small bore chest tube placed 2/3 and subsequently underwent instillation of tPA and Dornase x2 doses with good response. He underwent repeat chest CT with near complete resolution of left pleural effusion. Therefore on 2/6 his pigtial chest tube was removed. Patient will need to follow up with Dr. Tonia Brooms upon discharge for further evaluation of lung nodule.             SIGNIFICANT DIAGNOSTIC STUDIES 2/02 Admit  2/03 Chest tube placed for  left effusion   MICRO DATA  COVID 2/3 >> negative  MRSA PCR 2/3 >> negative Pleural Fluid Culture 2/3 >> moderate WBC >>   ANTIBIOTICS Vanco 2/3 >> 2/4 Zosyn 2/3 >> 2/3  Cefepime 2/3 >>  11/29/2020 Flagyl 2/3 >>  11/29/2020  Augmentin stop date 12/16/2020  CONSULTS None  TUBES / LINES None  Discharge Exam: General appearance: 87 y.o., male, NAD, conversant frail elderly cachectic Eyes: anicteric sclerae, moist conjunctivae; no lid-lag; PERRLA, tracking appropriately HENT: NCAT; oropharynx, MMM, no mucosal ulcerations; normal hard and soft palate Neck: Trachea midline;  FROM, supple, lymphadenopathy, no JVD Lungs: Diminished breath sounds bilaterally no wheeze CV: RRR, S1, S2, no MRGs  Abdomen: Soft, non-tender; non-distended, BS present  Extremities: No peripheral edema, radial and DP pulses present bilaterally, muscle wasting present Skin: Normal temperature, turgor and texture; no rash Psych: Appropriate affect Neuro: Alert and oriented to person and place, no focal deficit      Vitals:   11/28/20 1447 11/28/20 1828 11/28/20 2047 11/29/20 0609  BP: 115/60 (!) 119/56 (!) 114/58 (!) 117/59  Pulse: 69 78 67 78  Resp: $Remo'16 19 17   'sVjbE$ Temp: 97.7 F (36.5 C) 98.3 F (36.8 C) 98 F (36.7 C) 97.8 F (36.6 C)  TempSrc: Oral Oral Oral Oral  SpO2: 97% 99% 97% 96%  Weight:      Height:         Discharge Labs  BMET Recent Labs  Lab 11/26/20 0527 11/27/20 0530 11/28/20 0613  NA 139 139 141  K 3.4* 3.8 4.1  CL 104 103 105  CO2 $Re'24 25 27  'vwI$ GLUCOSE 122* 107* 85  BUN 34* 28* 28*  CREATININE 1.28* 1.02 1.16  CALCIUM 8.3* 8.3* 8.4*  MG 1.9  --   --   PHOS 3.4  --   --     CBC Recent Labs  Lab 11/26/20 0527 11/27/20 0530 11/28/20 0613  HGB 10.0* 8.9* 8.9*  HCT 32.4* 29.2* 29.2*  WBC 15.6* 10.8* 8.9  PLT 359 375 443*    Anti-Coagulation Recent Labs  Lab 11/25/20 2118  INR 1.3*     Discharge Instructions    Ambulatory referral to Pulmonology   Complete by: As directed    Reason for referral: Lung Mass/Lung Nodule   Discharge patient   Complete by: As directed    Discharge disposition: 01-Home or Self Care   Discharge patient date: 11/29/2020        Follow-up Information    Morgen Linebaugh L, DO. Schedule an appointment as soon as possible for a visit.   Specialty: Pulmonary Disease Why: Follow up lung nodule  Contact information: [address] [place] [address] [phone]                Allergies as of 11/29/2020   No Known Allergies     Medication List    TAKE these medications   albuterol 108  (90 Base) MCG/ACT inhaler Commonly known as: VENTOLIN HFA Inhale 2 puffs into the lungs every 6 (six) hours as needed for wheezing.   amoxicillin-clavulanate 875-125 MG tablet Commonly known as: AUGMENTIN Take 1 tablet by mouth every 12 (twelve) hours for 17 days.   atorvastatin 10 MG tablet Commonly known as: LIPITOR TAKE 1 TABLET BY MOUTH EVERY DAY/LABS FOR REFILLS What changed: See the new instructions.   benzonatate 200 MG capsule Commonly known as: TESSALON Take 1 capsule (200 mg total) by mouth 3 (three) times daily as needed for  cough.   Breztri Aerosphere 160-9-4.8 MCG/ACT Aero Generic drug: Budeson-Glycopyrrol-Formoterol Inhale 2 puffs into the lungs in the morning and at bedtime.   CENTRUM PO Take 1 tablet by mouth daily.   ciprofloxacin 500 MG tablet Commonly known as: CIPRO Take 1 tablet (500 mg total) by mouth daily with breakfast for 10 days.   ibuprofen 600 MG tablet Commonly known as: ADVIL Take 1 tablet (600 mg total) by mouth every 8 (eight) hours as needed. What changed: reasons to take this   tiotropium 18 MCG inhalation capsule Commonly known as: SPIRIVA Place 1 capsule (18 mcg total) into inhaler and inhale daily. FOR MAINTENANCE   vitamin C 1000 MG tablet Take 1,000 mg by mouth daily.       Disposition:   Discharged Condition: Yandriel Boening Rohl has met maximum benefit of inpatient care and is medically stable and cleared for discharge.  Patient is pending follow up as above.      Time spent on disposition:  32 Minutes.    [facility] 11/29/2020 9:04 AM

## 2020-11-28 NOTE — Progress Notes (Signed)
PHARMACIST - PHYSICIAN COMMUNICATION  CONCERNING: Antibiotic IV to Oral Route Change Policy  RECOMMENDATION: This patient is receiving flagyl by the intravenous route.  Based on criteria approved by the Pharmacy and Therapeutics Committee, the antibiotic(s) is/are being converted to the equivalent oral dose form(s).   DESCRIPTION: These criteria include:  Patient being treated for a respiratory tract infection, urinary tract infection, cellulitis or clostridium difficile associated diarrhea if on metronidazole  The patient is not neutropenic and does not exhibit a GI malabsorption state  The patient is eating (either orally or via tube) and/or has been taking other orally administered medications for a least 24 hours  The patient is improving clinically and has a Tmax < 100.5  If you have questions about this conversion, please contact the Pharmacy Department  []  ( [phone] )  [place] []  ( [phone]  [facility] []  ( [phone]  [place] []  ( [phone] )  [facility] [x]  ( [phone] )  [facility]  

## 2020-11-28 NOTE — Progress Notes (Signed)
Patients daughter called out at with concerns of patients chest tube. Patients daughter stated that MD stated he should be hooked up to suction wall. No orders in system for wall suction of chest tube. Explained to daughter orders stated water seal and it was draining. Contacted Agricultural consultant and [employer]. It was determined per policy that on this unit we are not allowed to do wall suction for chest tube. MD made aware and transfer orders have been entered by MD.

## 2020-11-28 NOTE — Progress Notes (Signed)
Pt transferred to 4th floor room 1405. Tele monitor applied, vital signs stable. Chest tube connected to wall suction set at 20cm on chest tube canister. Verified by charge RN. No air leak present at this time. Will continue to monitor.

## 2020-11-28 NOTE — Progress Notes (Signed)
NAME:  Timothy Waller, MRN:  [id number], DOB:  1933/11/01, LOS: 3 ADMISSION DATE:  11/25/2020, CONSULTATION DATE:  2/2 REFERRING MD:  Dr. Sheppard Coil, CHIEF COMPLAINT:  Pleural Effusion    Brief History:  87 y/o M, former 32 pk year smoker (quit Dec 2021) who presented to [facility] on 2/2 as a direct admission for evaluation of pleural effusion.  The patient is followed by Dr. Sheppard Coil and Dr. Harrington Challenger.  His daughter is an Therapist, sports who works for Dr. Harrington Challenger.  The patient is a former Company secretary.  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 He remains functional and cognitively sharp.  The notes he has lost approximately 30 lbs in the last year without trying, he notes night sweats since Thanksgiving.  He associates issues starting after COVID booster on 09/21/20.  PCP was in the process of working up his failure to thrive pattern.  A CT of the chest was completed 1/31 which showed a large loculated appearing fluid collection on the left side.  He has not had pain or increased shortness of breath but he reports approximately 10 days ago that he had sudden left sided chest pain that resolved without intervention.  He denies hx of autoimmune disease, difficulty swallowing or choking after eating and infected / broken teeth.  He was admitted per PCCM 2/3 for evaluation of left effusion.   Past Medical History:  COPD - not defined  Tobacco Abuse - began smoking at age 17, up to 1.5 ppd, quit 09/2020 HLD  Weight Loss  Osteoporosis   Significant Hospital Events:  2/02 Admit  2/03 Chest tube placed for left effusion   Consults:    Procedures:  L CT 2/3 >>   Significant Diagnostic Tests:   Left Pleural Fluid 2/2 >> exudate, LD 2,158, protein 4.3, TNC 11,893  Pleural Cytology 2/2 >>   Micro Data:  COVID 2/3 >> negative  MRSA PCR 2/3 >> negative Pleural Fluid Culture 2/3 >> moderate WBC >>   Antimicrobials:  Vanco 2/3 >> 2/4 Zosyn 2/3 >> 2/3  Cefepime 2/3 >>  Flagyl 2/3 >>   Interim  History / Subjective:   200 cc of chest tube output.  Spoke with patient's daughter at bedside.  Objective   Blood pressure (!) 113/53, pulse 76, temperature 98 F (36.7 C), temperature source Oral, resp. rate 14, height 5\' 8"  (1.727 m), weight 61.6 kg, SpO2 92 %.        Intake/Output Summary (Last 24 hours) at 11/28/2020 0710 Last data filed at 11/28/2020 0962 Gross per 24 hour  Intake 1130.51 ml  Output 725 ml  Net 405.51 ml   Filed Weights   11/25/20 2045 11/28/20 0543  Weight: 54.6 kg 61.6 kg    Examination: General: Frail elderly male HEENT: Mucous membranes moist, tracking appropriately NCAT, temporalis muscle wasting Neuro: Alert and oriented following commands CV: Regular rhythm, S1-S2 PULM: Diminished left base compared to the right no crackles no wheeze GI: Soft nontender nondistended Extremities: No significant edema Skin: No rash  CT chest: CT chest near complete resolution of the left-sided pleural effusion.  Small basilar left pneumothorax irregular nodule within the left lower lobe.  This nodule needs to be stable.  Resolved Hospital Problem list     Assessment & Plan:   Large Left Exudative Pleural Effusion  Left Pulmonary Nodules Noted tracheal deviation toward the effusion on plain film, ? Endobronchial lesion.  DDx includes aspiration, infection, and malignancy.  Less likely  transudative process.  Plan: Leave tube in place as he had 200 cc out. Transfer to the floor where we can hook suction up. If he has no significant drainage out tomorrow can consider removal of chest drain. Most of it looks like it has resolved. No additional TPA dornase  He needs outpatient follow-up with pulmonary at discharge to see me to ensure resolution of lower lobe spiculated nodule  COPD / Emphysema  COPD not defined with PFT's.   Plan: Need outpatient PFTs at some point. Continue albuterol  Anemia  Hx colonoscopy in last year that was normal. Suspect some degree  of hemoconcentration on admit + critical illness with empyema.  Plan: No sign of bleeding  HLD  -continue Lipitor  Hypokalemia  - Continue to monitor  Severe Protein Calorie Malnutrition / Cachexia  Advance diet as tolerated  Best practice (evaluated daily)  Diet: as tolerated  Pain/Anxiety/Delirium protocol (if indicated): n/a VAP protocol (if indicated): n/a  DVT prophylaxis: heparin  GI prophylaxis: n/a  Glucose control: n/a  Mobility: as tolerated  Disposition: Floor  Daughter Webb Silversmith [phone]) updated on plan of care 2/4 in detail  Goals of Care:  Last date of multidisciplinary goals of care discussion: n/a Family and staff present:  Summary of discussion:  Follow up goals of care discussion due:  Code Status: Full Code   Labs   CBC: Recent Labs  Lab 11/26/20 0527 11/27/20 0530 11/28/20 0613  WBC 15.6* 10.8* 8.9  HGB 10.0* 8.9* 8.9*  HCT 32.4* 29.2* 29.2*  MCV 91.3 91.0 91.3  PLT 359 375 443*    Basic Metabolic Panel: Recent Labs  Lab 11/26/20 0527 11/27/20 0530  NA 139 139  K 3.4* 3.8  CL 104 103  CO2 24 25  GLUCOSE 122* 107*  BUN 34* 28*  CREATININE 1.28* 1.02  CALCIUM 8.3* 8.3*  MG 1.9  --   PHOS 3.4  --    GFR: Estimated Creatinine Clearance: 47 mL/min (by C-G formula based on SCr of 1.02 mg/dL). Recent Labs  Lab 11/26/20 0527 11/26/20 1157 11/27/20 0530 11/28/20 0613  PROCALCITON  --  0.52 0.45  --   WBC 15.6*  --  10.8* 8.9    Liver Function Tests: Recent Labs  Lab 11/26/20 0539  ALBUMIN 2.2*   No results for input(s): LIPASE, AMYLASE in the last 168 hours. No results for input(s): AMMONIA in the last 168 hours.  ABG No results found for: PHART, PCO2ART, PO2ART, HCO3, TCO2, ACIDBASEDEF, O2SAT   Coagulation Profile: Recent Labs  Lab 11/25/20 2118  INR 1.3*    Cardiac Enzymes: No results for input(s): CKTOTAL, CKMB, CKMBINDEX, TROPONINI in the last 168 hours.  HbA1C: Hgb A1c MFr Bld  Date/Time Value Ref Range  Status  11/06/2018 09:47 AM 5.8 (H) <5.7 % of total Hgb Final    Comment:    For someone without known diabetes, a hemoglobin  A1c value between 5.7% and 6.4% is consistent with prediabetes and should be confirmed with a  follow-up test. . For someone with known diabetes, a value <7% indicates that their diabetes is well controlled. A1c targets should be individualized based on duration of diabetes, age, comorbid conditions, and other considerations. . This assay result is consistent with an increased risk of diabetes. . Currently, no consensus exists regarding use of hemoglobin A1c for diagnosis of diabetes for children. .     CBG: No results for input(s): GLUCAP in the last 168 hours.    Garner Nash,  DO [facility] 11/28/2020 7:10 AM

## 2020-11-29 ENCOUNTER — Inpatient Hospital Stay (HOSPITAL_COMMUNITY): Payer: Medicare Other

## 2020-11-29 DIAGNOSIS — J432 Centrilobular emphysema: Secondary | ICD-10-CM | POA: Diagnosis not present

## 2020-11-29 DIAGNOSIS — R64 Cachexia: Secondary | ICD-10-CM

## 2020-11-29 DIAGNOSIS — Z9689 Presence of other specified functional implants: Secondary | ICD-10-CM | POA: Diagnosis not present

## 2020-11-29 DIAGNOSIS — J90 Pleural effusion, not elsewhere classified: Secondary | ICD-10-CM | POA: Diagnosis not present

## 2020-11-29 DIAGNOSIS — Z720 Tobacco use: Secondary | ICD-10-CM

## 2020-11-29 DIAGNOSIS — R911 Solitary pulmonary nodule: Secondary | ICD-10-CM | POA: Diagnosis not present

## 2020-11-29 DIAGNOSIS — J189 Pneumonia, unspecified organism: Secondary | ICD-10-CM

## 2020-11-29 LAB — GLUCOSE, CAPILLARY: Glucose-Capillary: 76 mg/dL (ref 70–99)

## 2020-11-29 MED ORDER — AMOXICILLIN-POT CLAVULANATE 875-125 MG PO TABS
1.0000 | ORAL_TABLET | Freq: Two times a day (BID) | ORAL | 0 refills | Status: AC
Start: 2020-11-29 — End: 2020-12-16

## 2020-11-29 MED ORDER — AMOXICILLIN-POT CLAVULANATE 875-125 MG PO TABS
1.0000 | ORAL_TABLET | Freq: Two times a day (BID) | ORAL | Status: DC
  Administered 2020-11-29: 1 via ORAL
  Filled 2020-11-29: qty 1

## 2020-11-29 NOTE — Discharge Instructions (Signed)
Pleural Effusion Pleural effusion is an abnormal buildup of fluid in the layers of tissue between the lungs and the inside of the chest (pleural space). The two layers of tissue that line the lungs and the inside of the chest are called pleura. Usually, there is no air in the space between the pleura, only a thin layer of fluid. Some conditions can cause a large amount of fluid to build up, which can cause the lung to collapse if untreated. A pleural effusion is usually caused by another disease that requires treatment. What are the causes? Pleural effusion can be caused by:  Heart failure.  Certain infections, such as pneumonia or tuberculosis.  Cancer.  A blood clot in the lung (pulmonary embolism).  Complications from surgery, such as from open heart surgery.  Liver disease (cirrhosis).  Kidney disease. What are the signs or symptoms? In some cases, pleural effusion may cause no symptoms. If symptoms are present, they may include:  Shortness of breath, especially when lying down.  Chest pain. This may get worse when taking a deep breath.  Fever.  Dry, long-lasting (chronic) cough.  Hiccups.  Rapid breathing. An underlying condition that is causing the pleural effusion (such as heart failure, pneumonia, blood clots, tuberculosis, or cancer) may also cause other symptoms. How is this diagnosed? This condition may be diagnosed based on:  Your symptoms and medical history.  A physical exam.  A chest X-ray.  A procedure to use a needle to remove fluid from the pleural space (thoracentesis). This fluid is tested.  Other imaging studies of the chest, such as ultrasound or CT scan. How is this treated? Depending on the cause of your condition, treatment may include:  Treating the underlying condition that is causing the effusion. When that condition improves, the effusion will also improve. Examples of treatment for underlying conditions include: ? Antibiotic medicines to  treat an infection. ? Diuretics or other heart medicines to treat heart failure.  Thoracentesis.  Placing a thin flexible tube under your skin and into your chest to continuously drain the effusion (indwelling pleural catheter).  Surgery to remove the outer layer of tissue from the pleural space (decortication).  A procedure to put medicine into the chest cavity to seal the pleural space and prevent fluid buildup (pleurodesis).  Chemotherapy and radiation therapy, if you have cancerous (malignant) pleural effusion. These treatments are typically used to treat cancer. They kill certain cells in the body.   Follow these instructions at home:  Take over-the-counter and prescription medicines only as told by your health care provider.  Ask your health care provider what activities are safe for you.  Keep track of how long you are able to do mild exercise (such as walking) before you get short of breath. Write down this information to share with your health care provider. Your ability to exercise should improve over time.  Do not use any products that contain nicotine or tobacco, such as cigarettes and e-cigarettes. If you need help quitting, ask your health care provider.  Keep all follow-up visits as told by your health care provider. This is important. Contact a health care provider if:  The amount of time that you are able to do mild exercise: ? Decreases. ? Does not improve with time.  You have a fever. Get help right away if:  You are short of breath.  You develop chest pain.  You develop a new cough. Summary  Pleural effusion is an abnormal buildup of fluid in   the layers of tissue between the lungs and the inside of the chest.  Pleural effusion can have many causes, including heart failure, pulmonary embolism, infections, or cancer.  Symptoms of pleural effusion can include shortness of breath, chest pain, fever, long-lasting (chronic) cough, hiccups, or rapid  breathing.  Diagnosis often involves making images of the chest (such as with ultrasound or X-ray) and removing fluid (thoracentesis) to send for testing.  Treatment for pleural effusion depends on what underlying condition is causing it. This information is not intended to replace advice given to you by your health care provider. Make sure you discuss any questions you have with your health care provider. Document Revised: 07/10/2020 Document Reviewed: 06/11/2020 Elsevier Patient Education  2021 [employer].   Pulmonary Nodule  A pulmonary nodule is a small, round growth of tissue in the lung. It is sometimes referred to as a shadow or a spot on the lung. Nodules can vary in size, and most measure less than  of an inch (10 mm). Nodules bigger than 1.2 inches (3 cm) are called lung masses. A pulmonary nodule is sometimes found during a routine chest X-ray or while other imaging tests are done to check for other problems. Pulmonary nodules can be either noncancerous (benign) or cancerous (malignant). Most are noncancerous. Smaller nodules in people who do not smoke and who do not have any other risk factors for lung cancer are more likely to be noncancerous. Larger, irregular nodules in people who smoke or who have a strong family history of lung cancer are more likely to be cancerous. What are the causes? This condition may be caused by:  A bacterial, fungal, or viral infection, such as tuberculosis. The infection is usually an old and inactive one.  Cancerous tissue, such as lung cancer or a cancer in another part of the body that has spread to the lung.  A noncancerous mass of tissue.  Inflammation from conditions such as rheumatoid arthritis.  Abnormal blood vessels in the lungs. What are the signs or symptoms? Usually, there are no symptoms of this condition. If symptoms appear, they are usually related to the underlying cause. For example, if the condition is caused by an  infection, you may have a cough or a fever. How is this diagnosed? This condition is usually diagnosed with an X-ray or CT scan. To help determine whether a pulmonary nodule is benign or malignant, your health care provider will:  Take your medical history.  Perform a physical exam.  Order tests. These may include: ? Chest X-rays. ? A CT scan. This test shows smaller pulmonary nodules more clearly and with more detail than an X-ray. ? A positron emission tomography (PET) scan. This test is done to check if a nodule is cancerous. During the test, a small amount of a radioactive substance is injected into the bloodstream. Then a picture is taken. ? Biopsy to evaluate for cancer or confirm a diagnosis. This procedure involves removing a tissue sample from the nodule by inserting a needle through the chest, placing a scope down into the lung, or doing open surgery. ? A skin test called a tuberculin test. This test is done to check if you have been exposed to the germ that causes tuberculosis. ? Blood tests. How is this treated? Treatment for this condition depends on whether the pulmonary nodule is malignant or benign. It also depends on your risk of getting cancer. Noncancerous nodules usually do not need to be treated, but they may need  to be monitored with CT scans. If a CT scan shows that the pulmonary nodule got bigger, more tests may be done. Nodules that are found to be cancerous after a biopsy require treatment depending on the type and stage of the cancer. You will need more diagnostic tests, such as CT and PET scans, to determine the stage of the cancer. Treatments for cancer can include:  Surgery.  Radiation therapy.  Chemotherapy.  Immunotherapy. Some nodules need to be removed. If you need a nodule removed, you may have a procedure called a thoracotomy. During the procedure, your health care provider will make an incision in your chest and remove the part of the lung where the  nodule is located. Follow these instructions at home:  Take over-the-counter and prescription medicines only as told by your health care provider.  Do not use any products that contain nicotine or tobacco. These products include cigarettes, chewing tobacco, and vaping devices, such as e-cigarettes. If you need help quitting, ask your health care provider.  Keep all follow-up visits. This is important. Contact a health care provider if:  You have pain in your chest, back, or shoulder.  You are short of breath or have trouble breathing when you are active.  You develop a cough, or you develop hoarseness for an unexplained reason.  You feel sick or unusually tired.  You do not feel like eating or you lose weight without trying.  You develop chills or night sweats.  You need two or more pillows to sleep on at night.  You have any of these problems: ? A fever and symptoms that suddenly get worse. ? A fever or persistent symptoms for more than 2-3 days. Get help right away if:  You cannot catch your breath.  You have sudden chest pain.  You start making high-pitched whistling sounds when you breathe, most often when you breathe out (you wheeze).  You cannot stop coughing, or you cough up blood or bloody mucus from your lungs (sputum).  You become dizzy or feel like you may faint. These symptoms may represent a serious problem that is an emergency. Do not wait to see if the symptoms will go away. Get medical help right away. Call your local emergency services (911 in the [place] Do not drive yourself to the hospital. Summary  A pulmonary nodule is a small, round growth of tissue in the lung. Most pulmonary nodules are noncancerous.  Common causes of pulmonary nodules include infection, inflammation, and noncancerous growths.  This condition is usually diagnosed with an X-ray or CT scan.  Treatment for this condition depends on whether the pulmonary nodule is malignant or  benign. It also depends on your risk of getting cancer.  If a nodule is found to be cancerous, you will need specific diagnostic tests and treatment options as told by your health care provider. This information is not intended to replace advice given to you by your health care provider. Make sure you discuss any questions you have with your health care provider. Document Revised: 04/29/2020 Document Reviewed: 04/29/2020 Elsevier Patient Education  [place].

## 2020-11-29 NOTE — TOC Initial Note (Signed)
Transition of Care [facility]) - Initial/Assessment Note    Patient Details  Name: Timothy Waller MRN: [id number] Date of Birth: 19-Mar-1934  Transition of Care [facility]) CM/SW Contact:    Joaquin Courts, RN Phone Number: 11/29/2020, 11:40 AM  Clinical Narrative:                 Patient set up with [facility] for HHPT, [employer] to deliver rolling walker to bedside for home use.  Expected Discharge Plan: [place] Barriers to Discharge: No Barriers Identified   Patient Goals and CMS Choice Patient states their goals for this hospitalization and ongoing recovery are:: to go home CMS Medicare.gov Compare Post Acute Care list provided to:: Patient Represenative (must comment) Choice offered to / list presented to : Adult Children  Expected Discharge Plan and Services Expected Discharge Plan: [place]   Discharge Planning Services: CM Consult Post Acute Care Choice: [place] arrangements for the past 2 months: Single Family Home Expected Discharge Date: 11/29/20               DME Arranged: [place] Rile rolling DME Agency: Other - Comment ([employer]) Date DME Agency Contacted: 11/29/20 Time DME Agency Contacted: [id number] Representative spoke with at DME Agency: [place]: PT [place]: [place] ([place]) Date [place]: 11/29/20 Time [place]: 44 Representative spoke with at [place]: Corene Cornea  Prior Living Arrangements/Services Living arrangements for the past 2 months: [place]   Patient language and need for interpreter reviewed:: Yes Do you feel safe going back to the place where you live?: Yes      Need for Family Participation in Patient Care: Yes (Comment) Care giver support system in place?: Yes (comment)   Criminal Activity/Legal Involvement Pertinent to Current Situation/Hospitalization: No - Comment as needed  Activities of Daily Living Home Assistive Devices/Equipment: None ADL  Screening (condition at time of admission) Patient's cognitive ability adequate to safely complete daily activities?: Yes Is the patient deaf or have difficulty hearing?: Yes Does the patient have difficulty seeing, even when wearing glasses/contacts?: Yes Does the patient have difficulty concentrating, remembering, or making decisions?: No Patient able to express need for assistance with ADLs?: Yes Does the patient have difficulty dressing or bathing?: No Independently performs ADLs?: Yes (appropriate for developmental age) Does the patient have difficulty walking or climbing stairs?: No Weakness of Legs: None Weakness of Arms/Hands: None  Permission Sought/Granted                  Emotional Assessment Appearance:: Appears stated age         Psych Involvement: No (comment)  Admission diagnosis:  Empyema ([place]) [J86.9] Patient Active Problem List   Diagnosis Date Noted  . Lung nodule 11/29/2020  . Chest tube in place 11/29/2020  . CAP (community acquired pneumonia) 11/29/2020  . Pleural effusion 11/29/2020  . Cachexia ([place]) 11/29/2020  . Tobacco use 11/29/2020  . Centrilobular emphysema ([place]) 11/29/2020  . Empyema ([place]) 11/25/2020  . Benign localized prostatic hyperplasia with lower urinary tract symptoms (LUTS) 08/25/2020  . Skin lesion of face 10/29/2019  . Lightheadedness 10/29/2019  . Proteinuria 01/31/2018  . Frequent urination 01/31/2018  . Orthostasis 06/12/2017  . Heavy tobacco smoker 06/12/2017  . Benign colonic polyp 11/02/2016  . Needs flu shot 07/08/2016  . Osteopenia 02/17/2016  . Essential hypertension 02/09/2016  . Special screening for malignant neoplasms, colon 03/05/2015  . Hyperlipidemia 12/16/2014  . COPD  with emphysema ([place]) 12/15/2014  . SHOULDER PAIN 08/24/2010  . HEMATURIA UNSPECIFIED 06/11/2009  . ERECTILE DYSFUNCTION, ORGANIC 06/11/2009  . SEBORRHEIC KERATOSIS 06/11/2009  . LUMBAGO 06/11/2009   PCP:  Emeterio Reeve, DO Pharmacy:    [id number] - [place], [place] [address] Phone: [phone] Fax: [phone]     Social Determinants of Health (SDOH) Interventions    Readmission Risk Interventions No flowsheet data found.

## 2020-11-29 NOTE — Progress Notes (Signed)
Chest tube removed per order by ICU RN Lars Masson. Pt tolerated well without complication. Pressure dressing applied and pt educated to keep dressing in place for 24hours. Pt verbalized understanding. O2 stable at 98% on 2L. Will continue to wean pt off O2.

## 2020-11-29 NOTE — Progress Notes (Signed)
Physical Therapy Treatment Patient Details Name: Timothy Waller MRN: [id number] DOB: 08-28-1934 Today's Date: 11/29/2020    History of Present Illness Patient is an 87 year old male admitted post covid booster failure to thrive and pleuritic pain resulting in CT diagnosis of loculated large left pleural effusion 11/23/20. S/p pleural catheter insertion and pleural fibrinolysis 2/3    PT Comments    Pt  unsteady without RW support. dtr reports that DME noted in previous PT notes is actually her mom's.  Pt will need RW and HHPT.   SpO2= 93-97% on RA. Will want to monitor at rest. Pt is very pleasant and motivated. dtr is an Therapist, sports, very supportive.   Follow Up Recommendations  Home health PT (dtr works, does not feel OPPT is possible)     Equipment Recommendations  Rolling walker with 5" wheels (DME is pt's wife)    Recommendations for Other Services       Precautions / Restrictions Precautions Precautions: Fall Restrictions Weight Bearing Restrictions: No    Mobility  Bed Mobility Overal bed mobility: Modified Independent             General bed mobility comments: no physical assist, HOB elevated  Transfers Overall transfer level: Needs assistance Equipment used: Rolling walker (2 wheeled);None Transfers: Sit to/from Stand Sit to Stand: Min assist;Min guard         General transfer comment: min assist to rise and steady without RW support. cues for safe hand placement  Ambulation/Gait Ambulation/Gait assistance: Min assist;Min guard Gait Distance (Feet): 220 Feet Assistive device: Rolling walker (2 wheeled) Gait Pattern/deviations: Step-through pattern;Decreased stride length     General Gait Details: attmeptd without RW, pt requiring min assist for balance, LOB x3 within ~5'. transitioned to RW with improved stability, min/guard for safety, cues for RW technique   Stairs             Wheelchair Mobility    Modified Rankin (Stroke Patients Only)        Balance Overall balance assessment: Needs assistance   Sitting balance-Leahy Scale: Good     Standing balance support: Bilateral upper extremity supported;During functional activity;No upper extremity supported Standing balance-Leahy Scale: Poor Standing balance comment: reliant on UEs                            Cognition Arousal/Alertness: Awake/alert Behavior During Therapy: WFL for tasks assessed/performed Overall Cognitive Status: Within Functional Limits for tasks assessed                                        Exercises      General Comments        Pertinent Vitals/Pain Pain Assessment: No/denies pain    Home Living                      Prior Function            PT Goals (current goals can now be found in the care plan section) Acute Rehab PT Goals Patient Stated Goal: to go home PT Goal Formulation: With patient/family Time For Goal Achievement: 12/11/20 Potential to Achieve Goals: Good Progress towards PT goals: Progressing toward goals    Frequency    Min 3X/week      PT Plan Current plan remains appropriate    Co-evaluation  AM-PAC PT "6 Clicks" Mobility   Outcome Measure  Help needed turning from your back to your side while in a flat bed without using bedrails?: None Help needed moving from lying on your back to sitting on the side of a flat bed without using bedrails?: None Help needed moving to and from a bed to a chair (including a wheelchair)?: A Little Help needed standing up from a chair using your arms (e.g., wheelchair or bedside chair)?: A Little Help needed to walk in hospital room?: A Little Help needed climbing 3-5 steps with a railing? : A Little 6 Click Score: 20    End of Session Equipment Utilized During Treatment: Gait belt Activity Tolerance: Patient tolerated treatment well Patient left: in chair;with call bell/phone within reach;with family/visitor  present;with chair alarm set Nurse Communication: Mobility status PT Visit Diagnosis: Unsteadiness on feet (R26.81);Muscle weakness (generalized) (M62.81)     Time: 1937-9024 PT Time Calculation (min) (ACUTE ONLY): 16 min  Charges:  $Gait Training: 8-22 mins                     Baxter Flattery, PT  Acute Rehab Dept ([place]) [phone] Pager [phone]  11/29/2020    [facility] 11/29/2020, 12:03 PM

## 2020-11-29 NOTE — Progress Notes (Signed)
PCCM:  Pending CXR this AM we will plan for discharge home today  Abx changed to augmentin   Garner Nash, DO [facility] 11/29/2020 7:14 AM

## 2020-11-30 ENCOUNTER — Telehealth: Payer: Self-pay

## 2020-11-30 LAB — BODY FLUID CULTURE

## 2020-11-30 NOTE — Telephone Encounter (Signed)
Transition Care Management Follow-up Telephone Call  Date of discharge and from where: 11/29/20 from [place]  How have you been since you were released from the hospital? Pt states that he is feeling much better and slept really good last night.   Any questions or concerns? No  Items Reviewed:  Did the pt receive and understand the discharge instructions provided? Yes   Medications obtained and verified? Yes   Other? No   Any new allergies since your discharge? No   Dietary orders reviewed? N/A  Do you have support at home? Yes   Functional Questionnaire: (I = Independent and D = Dependent) ADLs: I  Bathing/Dressing- I  Meal Prep- I  Eating- I  Maintaining continence- I  Transferring/Ambulation- I  Managing Meds- I   Follow up appointments reviewed:   [facility] f/u appt confirmed? No  Dtr to call Pulmonary and sched appt.   Are transportation arrangements needed? No  If their condition worsens, is the pt aware to call PCP or go to the Emergency Dept.? Yes Was the patient provided with contact information for the PCP's office or ED? Yes Was to pt encouraged to call back with questions or concerns? Yes

## 2020-12-02 ENCOUNTER — Telehealth: Payer: Self-pay

## 2020-12-02 NOTE — Telephone Encounter (Signed)
Nunzio Cory from [facility] called stating that a home evaluation was completed for nurse and physical therapy assessment. Per RN, pt informed her that he does not need any services. Pt was recently discharged from the hospital.  Pt decline to continue further with the assessment. Due to facility protocol, provider must be notified of the event.

## 2020-12-08 ENCOUNTER — Ambulatory Visit (INDEPENDENT_AMBULATORY_CARE_PROVIDER_SITE_OTHER): Payer: Medicare Other

## 2020-12-08 ENCOUNTER — Ambulatory Visit (INDEPENDENT_AMBULATORY_CARE_PROVIDER_SITE_OTHER): Payer: Medicare Other | Admitting: Osteopathic Medicine

## 2020-12-08 ENCOUNTER — Encounter: Payer: Self-pay | Admitting: Osteopathic Medicine

## 2020-12-08 ENCOUNTER — Other Ambulatory Visit: Payer: Self-pay

## 2020-12-08 VITALS — BP 136/63 | HR 71 | Temp 98.3°F | Resp 19 | Ht 68.0 in | Wt 119.0 lb

## 2020-12-08 DIAGNOSIS — D649 Anemia, unspecified: Secondary | ICD-10-CM | POA: Diagnosis not present

## 2020-12-08 DIAGNOSIS — R748 Abnormal levels of other serum enzymes: Secondary | ICD-10-CM | POA: Diagnosis not present

## 2020-12-08 DIAGNOSIS — J90 Pleural effusion, not elsewhere classified: Secondary | ICD-10-CM

## 2020-12-08 DIAGNOSIS — R350 Frequency of micturition: Secondary | ICD-10-CM | POA: Diagnosis not present

## 2020-12-08 DIAGNOSIS — J439 Emphysema, unspecified: Secondary | ICD-10-CM

## 2020-12-08 NOTE — Progress Notes (Signed)
Timothy Waller is a 87 y.o. male who presents to  [facility]  today, 12/08/20, seeking care for the following:   Hospital follow-up: admitted for management exudative pleural effusion - following w/ pulmonary next few weeks   11/29/20 Hgb 8.9 - from d/c summary "colonoscopy in last year that was normal. Suspect some degree of hemoconcentration on admit + critical illness with empyema." would agree  Plt 443 likely reactive WBC improved to 8.9  11/27/20 Cr improved to 1.02 from 0.20 on admission Ca 8.3 mild decrease from admission   11/26/20 Sed rate trending down      ASSESSMENT & PLAN with other pertinent findings:  The primary encounter diagnosis was Pleural effusion. Diagnoses of Anemia, unspecified type, Pulmonary emphysema, unspecified emphysema type ([place]), Urinary frequency, and Elevated liver enzymes were also pertinent to this visit.  Pt reporting improvement Still concern for protein calorie malnutrition  Still concern forurinary frequency - suspect BPH will eval for persistent/recurrent complicated UTI, no catheterization in hospital    Images for CXR personally reviewed, awaiting radiology over-read, some residual pleural effusion compared to 10 days ago CXR 1V  There are no Patient Instructions on file for this visit.  Orders Placed This Encounter  Procedures   DG Chest 2 View   CBC with Differential/Platelet   COMPLETE METABOLIC PANEL WITH GFR   PSA, Total with Reflex to PSA, Free   Pathologist smear review   Urinalysis with Culture Reflex   REFLEXIVE URINE CULTURE   COMPLETE METABOLIC PANEL WITH GFR   reflex PSA, Free    No orders of the defined types were placed in this encounter.      See below for relevant physical exam findings  See below for recent lab and imaging results reviewed  Medications, allergies, PMH, PSH, SocH, [place] reviewed below    Follow-up instructions: Return in  about 4 weeks (around 01/05/2021) for FOLLOW-UP WEIGHT, ENERGY LEVELS. SONER AS NEEDED / DEPENDING ON PULMONARY ADVICE.                                        Exam:  BP 136/63 (BP Location: Right Leg, Cuff Size: Small)    Pulse 71    Temp 98.3 F (36.8 C) (Oral)    Resp 19    Ht 5\' 8"  (1.727 m)    Wt 119 lb (54 kg)    SpO2 96%    BMI 18.09 kg/m   Constitutional: VS see above. General Appearance: alert, well-developed, thin, NAD  Neck: No masses, trachea midline.   Respiratory: Normal respiratory effort. no wheeze, no rhonchi, no rales  Cardiovascular: S1/S2 normal, no murmur, no rub/gallop auscultated. RRR.   Musculoskeletal: Gait normal. Symmetric and independent movement of all extremities  Abdominal: non-tender, non-distended, no appreciable organomegaly, neg Murphy's, BS WNLx4  Neurological: Normal balance/coordination. No tremor.  Skin: warm, dry, intact.   Psychiatric: Normal judgment/insight. Normal mood and affect. Oriented x3.   Current Meds  Medication Sig   amoxicillin-clavulanate (AUGMENTIN) 875-125 MG tablet Take 1 tablet by mouth every 12 (twelve) hours for 17 days.   Ascorbic Acid (VITAMIN C) 1000 MG tablet Take 1,000 mg by mouth daily.   atorvastatin (LIPITOR) 10 MG tablet TAKE 1 TABLET BY MOUTH EVERY DAY/LABS FOR REFILLS (Patient taking differently: Take 10 mg by mouth daily.)   benzonatate (TESSALON) 200 MG capsule  Take 1 capsule (200 mg total) by mouth 3 (three) times daily as needed for cough.   Budeson-Glycopyrrol-Formoterol (BREZTRI AEROSPHERE) 160-9-4.8 MCG/ACT AERO Inhale 2 puffs into the lungs in the morning and at bedtime.   ibuprofen (ADVIL) 600 MG tablet Take 1 tablet (600 mg total) by mouth every 8 (eight) hours as needed. (Patient taking differently: Take 600 mg by mouth every 8 (eight) hours as needed for mild pain.)   Multiple Vitamins-Minerals (CENTRUM PO) Take 1 tablet by mouth daily.    No Known  Allergies  Patient Active Problem List   Diagnosis Date Noted   Lung nodule 11/29/2020   Chest tube in place 11/29/2020   CAP (community acquired pneumonia) 11/29/2020   Pleural effusion 11/29/2020   Cachexia ([place]) 11/29/2020   Tobacco use 11/29/2020   Centrilobular emphysema ([place]) 11/29/2020   Empyema ([place]) 11/25/2020   Benign localized prostatic hyperplasia with lower urinary tract symptoms (LUTS) 08/25/2020   Skin lesion of face 10/29/2019   Lightheadedness 10/29/2019   Proteinuria 01/31/2018   Frequent urination 01/31/2018   Orthostasis 06/12/2017   Heavy tobacco smoker 06/12/2017   Benign colonic polyp 11/02/2016   Needs flu shot 07/08/2016   Osteopenia 02/17/2016   Essential hypertension 02/09/2016   Special screening for malignant neoplasms, colon 03/05/2015   Hyperlipidemia 12/16/2014   COPD with emphysema ([place]) 12/15/2014   SHOULDER PAIN 08/24/2010   HEMATURIA UNSPECIFIED 06/11/2009   ERECTILE DYSFUNCTION, ORGANIC 06/11/2009   SEBORRHEIC KERATOSIS 06/11/2009   LUMBAGO 06/11/2009    Family History  Problem Relation Age of Onset   Cancer Father        mouth cancer     Social History   Tobacco Use  Smoking Status Former Smoker   Packs/day: 0.50   Types: Cigarettes   Quit date: 10/24/2020   Years since quitting: 0.1  Smokeless Tobacco Never Used    Past Surgical History:  Procedure Laterality Date   COLONOSCOPY     INNER EAR SURGERY     hole in eardrum 30 years ago    Immunization History  Administered Date(s) Administered   Fluad Quad(high Dose 65+) 07/03/2019, 07/13/2020   Influenza, High Dose Seasonal PF 06/12/2017, 07/19/2018   Influenza,inj,Quad PF,6+ Mos 07/08/2016   Influenza-Unspecified 07/24/2014, 07/22/2015   PFIZER(Purple Top)SARS-COV-2 Vaccination 11/08/2019, 11/29/2019, 09/21/2020   Pneumococcal Polysaccharide-23 10/24/2005   Pneumococcal-Unspecified 08/24/2014   Td 10/24/2005   Tdap  10/28/2015   Zoster 02/07/2014   No results found. No results found.  Results for orders placed or performed in visit on 12/08/20 (from the past 72 hour(s))  CBC with Differential/Platelet     Status: Abnormal   Collection Time: 12/08/20 12:00 AM  Result Value Ref Range   WBC 5.9 3.8 - 10.8 Thousand/uL   RBC 3.28 (L) 4.20 - 5.80 Million/uL   Hemoglobin 9.0 (L) 13.2 - 17.1 g/dL   HCT 28.1 (L) 38.5 - 50.0 %   MCV 85.7 80.0 - 100.0 fL   MCH 27.4 27.0 - 33.0 pg   MCHC 32.0 32.0 - 36.0 g/dL   RDW 13.2 11.0 - 15.0 %   Platelets 527 (H) 140 - 400 Thousand/uL   MPV 10.0 7.5 - 12.5 fL   Neutro Abs 3,446 1,500 - 7,800 cells/uL   Lymphs Abs 1,746 850 - 3,900 cells/uL   Absolute Monocytes 566 200 - 950 cells/uL   Eosinophils Absolute 112 15 - 500 cells/uL   Basophils Absolute 30 0 - 200 cells/uL   Neutrophils Relative % 58.4 %  Total Lymphocyte 29.6 %   Monocytes Relative 9.6 %   Eosinophils Relative 1.9 %   Basophils Relative 0.5 %  COMPLETE METABOLIC PANEL WITH GFR     Status: Abnormal   Collection Time: 12/08/20 12:00 AM  Result Value Ref Range   Glucose, Bld 72 65 - 99 mg/dL    Comment: .            Fasting reference interval .    BUN 25 7 - 25 mg/dL   Creat 0.97 0.70 - 1.11 mg/dL    Comment: For patients >30 years of age, the reference limit for Creatinine is approximately 13% higher for people identified as African-American. .    GFR, Est Non African American 71 > OR = 60 mL/min/1.46m2   GFR, Est African American 83 > OR = 60 mL/min/1.93m2   BUN/Creatinine Ratio NOT APPLICABLE 6 - 22 (calc)   Sodium 138 135 - 146 mmol/L   Potassium 4.7 3.5 - 5.3 mmol/L   Chloride 100 98 - 110 mmol/L   CO2 29 20 - 32 mmol/L   Calcium 8.8 8.6 - 10.3 mg/dL   Total Protein 6.7 6.1 - 8.1 g/dL   Albumin 2.8 (L) 3.6 - 5.1 g/dL   Globulin 3.9 (H) 1.9 - 3.7 g/dL (calc)   AG Ratio 0.7 (L) 1.0 - 2.5 (calc)   Total Bilirubin 0.2 0.2 - 1.2 mg/dL   Alkaline phosphatase (APISO) 99 35 - 144 U/L    AST 71 (H) 10 - 35 U/L   ALT 86 (H) 9 - 46 U/L  PSA, Total with Reflex to PSA, Free     Status: Abnormal   Collection Time: 12/08/20 12:00 AM  Result Value Ref Range   PSA, Total 7.5 (H) < OR = 4.0 ng/mL  Pathologist smear review     Status: None   Collection Time: 12/08/20 12:00 AM  Result Value Ref Range   Path Review      Comment: Myeloid population consists predominantly of mature segmented neutrophils with reactive changes. A few atypical lymphs. No immature cells are identified. Platelet clumps noted on smear-count appears increased. Normocytic anemia with a suggestion of rouleaux formation. Reviewed by Francis Gaines Mammarappallil, MD  (Electronic Signature on File)     12/09/2020   Urinalysis with Culture Reflex     Status: Abnormal   Collection Time: 12/08/20 12:00 AM   Specimen: Urine  Result Value Ref Range   Color, Urine YELLOW YELLOW   APPearance CLEAR CLEAR   Specific Gravity, Urine 1.016 1.001 - 1.03   pH 7.0 5.0 - 8.0   Glucose, UA NEGATIVE NEGATIVE   Bilirubin Urine NEGATIVE NEGATIVE   Ketones, ur NEGATIVE NEGATIVE   Hgb urine dipstick NEGATIVE NEGATIVE   Protein, ur TRACE (A) NEGATIVE   Nitrites, Initial NEGATIVE NEGATIVE   Leukocyte Esterase NEGATIVE NEGATIVE   WBC, UA 0-5 0 - 5 /HPF   RBC / HPF 0-2 0 - 2 /HPF   Squamous Epithelial / LPF NONE SEEN < OR = 5 /HPF   Bacteria, UA NONE SEEN NONE SEEN /HPF   Hyaline Cast NONE SEEN NONE SEEN /LPF  REFLEXIVE URINE CULTURE     Status: None   Collection Time: 12/08/20 12:00 AM  Result Value Ref Range   Reflexve Urine Culture      Comment: NO CULTURE INDICATED  reflex PSA, Free     Status: Abnormal   Collection Time: 12/08/20 12:00 AM  Result Value Ref Range   PSA, Free  1.8 ng/mL   PSA, % Free 24 (L) >25 % (calc)    Comment: . PSA(ng/mL)      Free PSA(%)     Estimated(x) Probability                                      of Cancer(as%) 0-2.5              (*)               Approx. 1 2.6-4.0(1)         0-27(2)                    24(3) 4.1-10(4)          0-10                      56                    11-15                     28                    16-20                     20                    21-25                     16                    >or =26                   8 >10(+)             N/A                      >50 . References:(1)Catalona et al.:Urology 60: 469-474 (2002)            (2)Catalona et al.:J.Urol 168: 922-925 (2002)               Free PSA(%)   Sensitivity(%)  Specificity(%)               < or = 25          85              19               < or = 30          93               9            (3)Catalona et al.:JAMA 277: 1452-1455 (1997)            (4)Catalona et al.:JAMA 279: 1443-1540 (1998) . (x)These estimates vary with age, ethnicity, family     history and DRE results. (*)The  diagnostic usefulness of % Free PSA has not been    established in patients with total PSA below 2.6 ng/mL (+)In men with PSA above 10 ng/mL, prostate cancer risk is    determined by total PSA alone. . The Total PSA value from this assay system is  standardized against the equimolar  PSA standard.  The test result will be approximately 20% higher  when compared to the [facility] Total PSA  (Siemens assay). Comparison of serial PSA results  should be interpreted with this fact in mind. Marland Kitchen PSA was performed using the Beckman Coulter Immunoassay method. Values obtained from different assay methods cannot be used interchangeably. PSA levels, regardless of value, should not be interpreted as absolute evidence of the presence or absence of disease. .        All questions at time of visit were answered - patient instructed to contact office with any additional concerns or updates. ER/RTC precautions were reviewed with the patient as applicable.   Please note: manual typing as well as voice recognition software may have been used to produce this document - typos may escape review. Please contact  Dr. Sheppard Coil for any needed clarifications.

## 2020-12-09 LAB — COMPLETE METABOLIC PANEL WITH GFR
AG Ratio: 0.7 (calc) — ABNORMAL LOW (ref 1.0–2.5)
ALT: 86 U/L — ABNORMAL HIGH (ref 9–46)
AST: 71 U/L — ABNORMAL HIGH (ref 10–35)
Albumin: 2.8 g/dL — ABNORMAL LOW (ref 3.6–5.1)
Alkaline phosphatase (APISO): 99 U/L (ref 35–144)
BUN: 25 mg/dL (ref 7–25)
CO2: 29 mmol/L (ref 20–32)
Calcium: 8.8 mg/dL (ref 8.6–10.3)
Chloride: 100 mmol/L (ref 98–110)
Creat: 0.97 mg/dL (ref 0.70–1.11)
GFR, Est African American: 83 mL/min/{1.73_m2} (ref >=60)
GFR, Est Non African American: 71 mL/min/{1.73_m2} (ref >=60)
Globulin: 3.9 g/dL (calc) — ABNORMAL HIGH (ref 1.9–3.7)
Glucose, Bld: 72 mg/dL (ref 65–99)
Potassium: 4.7 mmol/L (ref 3.5–5.3)
Sodium: 138 mmol/L (ref 135–146)
Total Bilirubin: 0.2 mg/dL (ref 0.2–1.2)
Total Protein: 6.7 g/dL (ref 6.1–8.1)

## 2020-12-09 LAB — URINALYSIS W MICROSCOPIC + REFLEX CULTURE
Bacteria, UA: NONE SEEN /HPF
Bilirubin Urine: NEGATIVE
Glucose, UA: NEGATIVE
Hgb urine dipstick: NEGATIVE
Hyaline Cast: NONE SEEN /LPF
Ketones, ur: NEGATIVE
Leukocyte Esterase: NEGATIVE
Nitrites, Initial: NEGATIVE
Specific Gravity, Urine: 1.016 (ref 1.001–1.03)
Squamous Epithelial / HPF: NONE SEEN /HPF (ref <=5)
pH: 7 (ref 5.0–8.0)

## 2020-12-09 LAB — CBC WITH DIFFERENTIAL/PLATELET
Absolute Monocytes: 566 cells/uL (ref 200–950)
Basophils Absolute: 30 cells/uL (ref 0–200)
Basophils Relative: 0.5 %
Eosinophils Absolute: 112 cells/uL (ref 15–500)
Eosinophils Relative: 1.9 %
HCT: 28.1 % — ABNORMAL LOW (ref 38.5–50.0)
Hemoglobin: 9 g/dL — ABNORMAL LOW (ref 13.2–17.1)
Lymphs Abs: 1746 cells/uL (ref 850–3900)
MCH: 27.4 pg (ref 27.0–33.0)
MCHC: 32 g/dL (ref 32.0–36.0)
MCV: 85.7 fL (ref 80.0–100.0)
MPV: 10 fL (ref 7.5–12.5)
Monocytes Relative: 9.6 %
Neutro Abs: 3446 cells/uL (ref 1500–7800)
Neutrophils Relative %: 58.4 %
Platelets: 527 10*3/uL — ABNORMAL HIGH (ref 140–400)
RBC: 3.28 10*6/uL — ABNORMAL LOW (ref 4.20–5.80)
RDW: 13.2 % (ref 11.0–15.0)
Total Lymphocyte: 29.6 %
WBC: 5.9 10*3/uL (ref 3.8–10.8)

## 2020-12-09 LAB — NO CULTURE INDICATED

## 2020-12-09 LAB — REFLEX PSA, FREE
PSA, % Free: 24 % (calc) — ABNORMAL LOW (ref >25)
PSA, Free: 1.8 ng/mL

## 2020-12-09 LAB — PSA, TOTAL WITH REFLEX TO PSA, FREE: PSA, Total: 7.5 ng/mL — ABNORMAL HIGH (ref <=4.0)

## 2020-12-09 LAB — PATHOLOGIST SMEAR REVIEW

## 2020-12-10 ENCOUNTER — Inpatient Hospital Stay: Payer: Medicare Other | Admitting: Osteopathic Medicine

## 2020-12-10 ENCOUNTER — Encounter: Payer: Self-pay | Admitting: Osteopathic Medicine

## 2020-12-10 NOTE — Telephone Encounter (Signed)
BI   pts daughter feels that the pt needs to be seen before the 3/7 HFU with you.  He was just seen by his PCP and told that his cxr looks better.  Please advise. thanks

## 2020-12-15 ENCOUNTER — Inpatient Hospital Stay: Payer: Medicare Other | Admitting: Adult Health

## 2020-12-16 ENCOUNTER — Ambulatory Visit (INDEPENDENT_AMBULATORY_CARE_PROVIDER_SITE_OTHER): Payer: Medicare Other | Admitting: Pulmonary Disease

## 2020-12-16 ENCOUNTER — Other Ambulatory Visit: Payer: Self-pay

## 2020-12-16 ENCOUNTER — Encounter: Payer: Self-pay | Admitting: Pulmonary Disease

## 2020-12-16 VITALS — BP 124/64 | HR 64 | Temp 97.6°F | Ht 68.0 in | Wt 121.1 lb

## 2020-12-16 DIAGNOSIS — J869 Pyothorax without fistula: Secondary | ICD-10-CM

## 2020-12-16 DIAGNOSIS — Z87891 Personal history of nicotine dependence: Secondary | ICD-10-CM | POA: Diagnosis not present

## 2020-12-16 DIAGNOSIS — R911 Solitary pulmonary nodule: Secondary | ICD-10-CM

## 2020-12-16 DIAGNOSIS — J432 Centrilobular emphysema: Secondary | ICD-10-CM | POA: Diagnosis not present

## 2020-12-16 DIAGNOSIS — J90 Pleural effusion, not elsewhere classified: Secondary | ICD-10-CM | POA: Diagnosis not present

## 2020-12-16 MED ORDER — BREZTRI AEROSPHERE 160-9-4.8 MCG/ACT IN AERO: 2.0000 | INHALATION_SPRAY | Freq: Two times a day (BID) | RESPIRATORY_TRACT | 0 refills | Status: DC

## 2020-12-16 NOTE — Progress Notes (Signed)
Synopsis: Referred in February 2022 for hospital follow-up by Emeterio Reeve, DO  Subjective:   PATIENT ID: Timothy Waller DOB: 1934/08/10, MRN: [id number]  Chief Complaint  Patient presents with  . Hospitalization Follow-up    Pt stated that he is feeling much better.   Repeat cxr last week with his PCP.  Oxygen levels are dropping with minimal exertion.  He was having some pressure in the left lower lung area about 4-5 days ago but nothing since that time. He is feeling good.     87 year old gentleman history of COPD hyperlipidemia.  Patient was recently discharged from the hospital on 11/29/2020.  Patient was found to have a large left-sided exudative pleural effusion pulmonary nodules.  Had a chest tube placed status post TPA and dornase.  Known emphysema COPD consistent with disease seen on CT.  Has severe protein calorie malnutrition and cachexia.  60-pack-year smoker quit December 2021.  Presented from home to [facility] for direct admission.  Followed by Dr. Sheppard Coil and Dr. Harrington Challenger in the outpatient.  He is a former Company secretary.  Previously managed on Spiriva.  Approximate 30 pound weight loss in the past year.  Covid booster in November 2021.  Patient was discharged on Augmentin.  He finishes his course of antibiotics tomorrow.  That will be 4 weeks of antimicrobial therapy for his parapneumonic effusion.  He had a repeat chest x-ray on February 15, 85 to primary care office which shows small amount of pleural fluid remaining on the left side.    Past Medical History:  Diagnosis Date  . Abnormal weight loss   . COPD (chronic obstructive pulmonary disease) ([place])   . Hyperlipidemia   . Osteoporosis 09/01/2010   Qualifier: Diagnosis of  By: McCrimmon CMA, (AAMA), Seth Bake       Family History  Problem Relation Age of Onset  . Cancer Father        mouth cancer      Past Surgical History:  Procedure Laterality Date  . COLONOSCOPY    . INNER EAR SURGERY     hole in  eardrum 30 years ago    Social History   Socioeconomic History  . Marital status: Married    Spouse name: Not on file  . Number of children: Not on file  . Years of education: Not on file  . Highest education level: Not on file  Occupational History  . Not on file  Tobacco Use  . Smoking status: Former Smoker    Packs/day: 0.50    Types: Cigarettes    Quit date: 10/24/2020    Years since quitting: 0.1  . Smokeless tobacco: Never Used  Vaping Use  . Vaping Use: Never used  Substance and Sexual Activity  . Alcohol use: Yes    Alcohol/week: 0.0 standard drinks    Comment: 2-3 drinks per week   . Drug use: No  . Sexual activity: Not Currently  Other Topics Concern  . Not on file  Social History Narrative  . Not on file   Social Determinants of Health   Financial Resource Strain: Not on file  Food Insecurity: Not on file  Transportation Needs: Not on file  Physical Activity: Not on file  Stress: Not on file  Social Connections: Not on file  Intimate Partner Violence: Not on file     No Known Allergies   Outpatient Medications Prior to Visit  Medication Sig Dispense Refill  . albuterol (VENTOLIN HFA) 108 (90 Base) MCG/ACT  inhaler Inhale 2 puffs into the lungs every 6 (six) hours as needed for wheezing. 2 each 11  . amoxicillin-clavulanate (AUGMENTIN) 875-125 MG tablet Take 1 tablet by mouth every 12 (twelve) hours for 17 days. 34 tablet 0  . Ascorbic Acid (VITAMIN C) 1000 MG tablet Take 1,000 mg by mouth daily.    Marland Kitchen atorvastatin (LIPITOR) 10 MG tablet TAKE 1 TABLET BY MOUTH EVERY DAY/LABS FOR REFILLS (Patient taking differently: Take 10 mg by mouth daily.) 90 tablet 2  . Budeson-Glycopyrrol-Formoterol (BREZTRI AEROSPHERE) 160-9-4.8 MCG/ACT AERO Inhale 2 puffs into the lungs in the morning and at bedtime. 4.8 g 0  . ibuprofen (ADVIL) 600 MG tablet Take 1 tablet (600 mg total) by mouth every 8 (eight) hours as needed. (Patient taking differently: Take 600 mg by mouth every  8 (eight) hours as needed for mild pain.) 30 tablet 0  . Multiple Vitamins-Minerals (CENTRUM PO) Take 1 tablet by mouth daily.    . benzonatate (TESSALON) 200 MG capsule Take 1 capsule (200 mg total) by mouth 3 (three) times daily as needed for cough. (Patient not taking: Reported on 12/16/2020) 30 capsule 0  . tiotropium (SPIRIVA) 18 MCG inhalation capsule Place 1 capsule (18 mcg total) into inhaler and inhale daily. FOR MAINTENANCE (Patient not taking: No sig reported) 90 capsule 3   No facility-administered medications prior to visit.    Review of Systems  Constitutional: Negative for chills, fever, malaise/fatigue and weight loss.  HENT: Negative for hearing loss, sore throat and tinnitus.   Eyes: Negative for blurred vision and double vision.  Respiratory: Positive for shortness of breath. Negative for cough, hemoptysis, sputum production, wheezing and stridor.   Cardiovascular: Negative for chest pain, palpitations, orthopnea, leg swelling and PND.  Gastrointestinal: Negative for abdominal pain, constipation, diarrhea, heartburn, nausea and vomiting.  Genitourinary: Negative for dysuria, hematuria and urgency.  Musculoskeletal: Negative for joint pain and myalgias.  Skin: Negative for itching and rash.  Neurological: Negative for dizziness, tingling, weakness and headaches.  Endo/Heme/Allergies: Negative for environmental allergies. Does not bruise/bleed easily.  Psychiatric/Behavioral: Negative for depression. The patient is not nervous/anxious and does not have insomnia.   All other systems reviewed and are negative.    Objective:  Physical Exam Vitals reviewed.  Constitutional:      General: He is not in acute distress.    Appearance: He is well-developed.  HENT:     Head: Normocephalic and atraumatic.     Mouth/Throat:     Mouth: Oropharynx is clear and moist.     Pharynx: No oropharyngeal exudate.  Eyes:     Extraocular Movements: EOM normal.     Conjunctiva/sclera:  Conjunctivae normal.     Pupils: Pupils are equal, round, and reactive to light.  Neck:     Vascular: No JVD.     Trachea: No tracheal deviation.     Comments: Loss of supraclavicular fat Cardiovascular:     Rate and Rhythm: Normal rate and regular rhythm.     Pulses: Intact distal pulses.     Heart sounds: S1 normal and S2 normal.     Comments: Distant heart tones Pulmonary:     Effort: No tachypnea or accessory muscle usage.     Breath sounds: No stridor. Decreased breath sounds (throughout all lung fields) present. No wheezing, rhonchi or rales.     Comments: Diminished breath sounds in the left base compared to the right and Increased AP chest diameter Abdominal:     General: Bowel sounds are  normal. There is no distension.     Palpations: Abdomen is soft.     Tenderness: There is no abdominal tenderness.  Musculoskeletal:        General: Deformity (muscle wasting ) present. No edema.  Skin:    General: Skin is warm and dry.     Capillary Refill: Capillary refill takes less than 2 seconds.     Findings: No rash.  Neurological:     Mental Status: He is alert and oriented to person, place, and time.  Psychiatric:        Mood and Affect: Mood and affect normal.        Behavior: Behavior normal.      Vitals:   12/16/20 1040  BP: 124/64  Pulse: 64  Temp: 97.6 F (36.4 C)  TempSrc: Tympanic  SpO2: 97%  Weight: 121 lb 2 oz (54.9 kg)  Height: 5\' 8"  (1.727 m)   97% on RA BMI Readings from Last 3 Encounters:  12/16/20 18.42 kg/m  12/08/20 18.09 kg/m  11/28/20 20.65 kg/m   Wt Readings from Last 3 Encounters:  12/16/20 121 lb 2 oz (54.9 kg)  12/08/20 119 lb (54 kg)  11/28/20 135 lb 12.9 oz (61.6 kg)     CBC    Component Value Date/Time   WBC 5.9 12/08/2020 0000   RBC 3.28 (L) 12/08/2020 0000   HGB 9.0 (L) 12/08/2020 0000   HCT 28.1 (L) 12/08/2020 0000   PLT 527 (H) 12/08/2020 0000   MCV 85.7 12/08/2020 0000   MCH 27.4 12/08/2020 0000   MCHC 32.0  12/08/2020 0000   RDW 13.2 12/08/2020 0000   LYMPHSABS 1,746 12/08/2020 0000   MONOABS 472 11/02/2016 1126   EOSABS 112 12/08/2020 0000   BASOSABS 30 12/08/2020 0000    Chest Imaging: Chest x-ray December 08, 2020: Decrease left-sided pleural effusion.  When compared to follow-up images from the hospital. The patient's images have been independently reviewed by me.    Pulmonary Functions Testing Results: No flowsheet data found.  FeNO:   Pathology:   Echocardiogram:   Heart Catheterization:     Assessment & Plan:     ICD-10-CM   1. Empyema of left pleural space (HCC)  J86.9   2. Pleural effusion, not elsewhere classified  J90 CT Chest Wo Contrast  3. Lung nodule  R91.1   4. Former smoker  Z87.891   5. Centrilobular emphysema ([place])  J43.2     Discussion:  87 year old gentleman history of longstanding tobacco abuse quit in December 2021.  Patient developed cough shortness of breath fevers.  Diagnosed with.  He acquired pneumonia and left-sided empyema.  Patient was admitted to the hospital status post chest tube drainage with TPA and dornase.  Here today for follow-up.  Subsequent CT chest after drainage of fluid revealed lower lobe lung nodule.  We will plan for follow-up of this as well.  Plan: Complete course of antibiotics which is scheduled for tomorrow to stop. Continue to monitor for any symptoms. Repeat ultrasound of the chest today in the office reveals enhancement of the visceral pleural surface of the lung.  He does have a small effusion still present in the left chest.  I suspect the left lung will take some time if ever to reexpand. Continue triple therapy inhaler regimen.  New prescription for this today.   Current Outpatient Medications:  .  albuterol (VENTOLIN HFA) 108 (90 Base) MCG/ACT inhaler, Inhale 2 puffs into the lungs every 6 (six) hours as needed  for wheezing., Disp: 2 each, Rfl: 11 .  amoxicillin-clavulanate (AUGMENTIN) 875-125 MG tablet, Take 1  tablet by mouth every 12 (twelve) hours for 17 days., Disp: 34 tablet, Rfl: 0 .  Ascorbic Acid (VITAMIN C) 1000 MG tablet, Take 1,000 mg by mouth daily., Disp: , Rfl:  .  atorvastatin (LIPITOR) 10 MG tablet, TAKE 1 TABLET BY MOUTH EVERY DAY/LABS FOR REFILLS (Patient taking differently: Take 10 mg by mouth daily.), Disp: 90 tablet, Rfl: 2 .  Budeson-Glycopyrrol-Formoterol (BREZTRI AEROSPHERE) 160-9-4.8 MCG/ACT AERO, Inhale 2 puffs into the lungs in the morning and at bedtime., Disp: 4.8 g, Rfl: 0 .  ibuprofen (ADVIL) 600 MG tablet, Take 1 tablet (600 mg total) by mouth every 8 (eight) hours as needed. (Patient taking differently: Take 600 mg by mouth every 8 (eight) hours as needed for mild pain.), Disp: 30 tablet, Rfl: 0 .  Multiple Vitamins-Minerals (CENTRUM PO), Take 1 tablet by mouth daily., Disp: , Rfl:   I spent 31 minutes dedicated to the care of this patient on the date of this encounter to include pre-visit review of records, face-to-face time with the patient discussing conditions above, post visit ordering of testing, clinical documentation with the electronic health record, making appropriate referrals as documented, and communicating necessary findings to members of the patients care team.   Garner Nash, DO [facility] 12/16/2020 11:11 AM

## 2020-12-16 NOTE — Addendum Note (Signed)
Addended by: Elie Confer on: 12/16/2020 11:39 AM   Modules accepted: Orders

## 2020-12-16 NOTE — Patient Instructions (Addendum)
Thank you for visiting Dr. Valeta Harms at [facility]. Today we recommend the following:  Orders Placed This Encounter  Procedures  . CT Chest Wo Contrast   CT scheduled in April 2022  Return in about 2 months (around 02/13/2021) for with APP or Dr. Valeta Harms.    Please do your part to reduce the spread of COVID-19.

## 2020-12-23 MED ORDER — SPACER/AERO-HOLDING CHAMBERS DEVI: 0 refills | Status: DC

## 2020-12-28 ENCOUNTER — Inpatient Hospital Stay: Payer: Medicare Other | Admitting: Pulmonary Disease

## 2021-01-12 ENCOUNTER — Encounter: Payer: Self-pay | Admitting: Osteopathic Medicine

## 2021-01-12 NOTE — Telephone Encounter (Signed)
PCCs:  Please see note regarding CT scan and scheduling prior to May 4th f/u with Dr. Valeta Harms.  Thank you.

## 2021-01-12 NOTE — Telephone Encounter (Signed)
Dr. Valeta Harms, Please see comment regarding dental referral.  Please advise.  Thank you.

## 2021-02-15 ENCOUNTER — Other Ambulatory Visit: Payer: Self-pay

## 2021-02-15 ENCOUNTER — Ambulatory Visit (INDEPENDENT_AMBULATORY_CARE_PROVIDER_SITE_OTHER): Payer: Medicare Other

## 2021-02-15 DIAGNOSIS — J90 Pleural effusion, not elsewhere classified: Secondary | ICD-10-CM | POA: Diagnosis not present

## 2021-02-15 DIAGNOSIS — J929 Pleural plaque without asbestos: Secondary | ICD-10-CM | POA: Diagnosis not present

## 2021-02-15 DIAGNOSIS — J984 Other disorders of lung: Secondary | ICD-10-CM | POA: Diagnosis not present

## 2021-02-15 DIAGNOSIS — J439 Emphysema, unspecified: Secondary | ICD-10-CM | POA: Diagnosis not present

## 2021-02-15 DIAGNOSIS — J189 Pneumonia, unspecified organism: Secondary | ICD-10-CM | POA: Diagnosis not present

## 2021-02-24 ENCOUNTER — Ambulatory Visit (INDEPENDENT_AMBULATORY_CARE_PROVIDER_SITE_OTHER): Payer: Medicare Other | Admitting: Pulmonary Disease

## 2021-02-24 ENCOUNTER — Other Ambulatory Visit: Payer: Self-pay

## 2021-02-24 ENCOUNTER — Encounter: Payer: Self-pay | Admitting: Pulmonary Disease

## 2021-02-24 ENCOUNTER — Encounter: Payer: Self-pay | Admitting: Osteopathic Medicine

## 2021-02-24 VITALS — BP 120/70 | HR 62 | Temp 98.5°F | Ht 68.0 in | Wt 130.2 lb

## 2021-02-24 DIAGNOSIS — J439 Emphysema, unspecified: Secondary | ICD-10-CM | POA: Diagnosis not present

## 2021-02-24 DIAGNOSIS — Z87891 Personal history of nicotine dependence: Secondary | ICD-10-CM

## 2021-02-24 DIAGNOSIS — K089 Disorder of teeth and supporting structures, unspecified: Secondary | ICD-10-CM

## 2021-02-24 DIAGNOSIS — J432 Centrilobular emphysema: Secondary | ICD-10-CM | POA: Diagnosis not present

## 2021-02-24 MED ORDER — ALBUTEROL SULFATE HFA 108 (90 BASE) MCG/ACT IN AERS: 2.0000 | INHALATION_SPRAY | Freq: Four times a day (QID) | RESPIRATORY_TRACT | 11 refills | Status: DC | PRN

## 2021-02-24 MED ORDER — BREZTRI AEROSPHERE 160-9-4.8 MCG/ACT IN AERO: 2.0000 | INHALATION_SPRAY | Freq: Two times a day (BID) | RESPIRATORY_TRACT | 6 refills | Status: DC

## 2021-02-24 NOTE — Patient Instructions (Addendum)
Thank you for visiting Dr. Valeta Harms at [facility]. Today we recommend the following:  Orders Placed This Encounter  Procedures  . Ambulatory Referral for Lung Cancer Scre  . Ambulatory referral to Dentistry  . Pulmonary Function Test   Meds ordered this encounter  Medications  . Budeson-Glycopyrrol-Formoterol (BREZTRI AEROSPHERE) 160-9-4.8 MCG/ACT AERO    Sig: Inhale 2 puffs into the lungs in the morning and at bedtime.    Dispense:  19.2 g    Refill:  6    Order Specific Question:   Lot Number?    Answer:   [id number]    Order Specific Question:   Manufacturer?    Answer:   [employer] [[id number]]    Order Specific Question:   Quantity    Answer:   4  . albuterol (VENTOLIN HFA) 108 (90 Base) MCG/ACT inhaler    Sig: Inhale 2 puffs into the lungs every 6 (six) hours as needed for wheezing.    Dispense:  2 each    Refill:  11   Return in about 1 year (around 02/24/2022).    Please do your part to reduce the spread of COVID-19.

## 2021-02-24 NOTE — Progress Notes (Signed)
Synopsis: Referred in February 2022 for hospital follow-up by Emeterio Reeve, DO  Subjective:   PATIENT ID: Timothy Waller DOB: September 20, 1934, MRN: [id number]  Chief Complaint  Patient presents with  . Follow-up    Pt is here to discuss results of recent CT.  Pt states he has been doing okay since last visit.    87 year old gentleman history of COPD hyperlipidemia.  Patient was recently discharged from the hospital on 11/29/2020.  Patient was found to have a large left-sided exudative pleural effusion pulmonary nodules.  Had a chest tube placed status post TPA and dornase.  Known emphysema COPD consistent with disease seen on CT.  Has severe protein calorie malnutrition and cachexia.  60-pack-year smoker quit December 2021.  Presented from home to [facility] for direct admission.  Followed by Dr. Sheppard Coil and Dr. Harrington Challenger in the outpatient.  He is a former Company secretary.  Previously managed on Spiriva.  Approximate 30 pound weight loss in the past year.  Covid booster in November 2021.  Patient was discharged on Augmentin.  He finishes his course of antibiotics tomorrow.  That will be 4 weeks of antimicrobial therapy for his parapneumonic effusion.  He had a repeat chest x-ray on December 08, 2018 to primary care office which shows small amount of pleural fluid remaining on the left side.  OV 02/24/2021: Patient here today for follow-up after CT imaging.  Overall feels better since hospitalization.  And recent follow-up with me in clinic.  Currently managed on new triple therapy inhaler regimen.  Was given new prescription after last office visit.  Ultrasound in the office completed at previous visit with no residual fluid.  Patient's CT scan of the chest was completed on 02/15/2021 this reveals near complete resolution of the pleural fluid in the left chest.  Some mild pleural thickening remains.  Postinfectious scarring.  The irregular nodule in the left base much smaller considered related to the  infection.  Evidence of emphysema still present.  Overall he is    Past Medical History:  Diagnosis Date  . Abnormal weight loss   . COPD (chronic obstructive pulmonary disease) ([place])   . Hyperlipidemia   . Osteoporosis 09/01/2010   Qualifier: Diagnosis of  By: McCrimmon CMA, (AAMA), Seth Bake       Family History  Problem Relation Age of Onset  . Cancer Father        mouth cancer      Past Surgical History:  Procedure Laterality Date  . COLONOSCOPY    . INNER EAR SURGERY     hole in eardrum 30 years ago    Social History   Socioeconomic History  . Marital status: Married    Spouse name: Not on file  . Number of children: Not on file  . Years of education: Not on file  . Highest education level: Not on file  Occupational History  . Not on file  Tobacco Use  . Smoking status: Former Smoker    Packs/day: 0.50    Types: Cigarettes    Quit date: 10/24/2020    Years since quitting: 0.3  . Smokeless tobacco: Never Used  Vaping Use  . Vaping Use: Never used  Substance and Sexual Activity  . Alcohol use: Yes    Alcohol/week: 0.0 standard drinks    Comment: 2-3 drinks per week   . Drug use: No  . Sexual activity: Not Currently  Other Topics Concern  . Not on file  Social History Narrative  .  Not on file   Social Determinants of Health   Financial Resource Strain: Not on file  Food Insecurity: Not on file  Transportation Needs: Not on file  Physical Activity: Not on file  Stress: Not on file  Social Connections: Not on file  Intimate Partner Violence: Not on file     No Known Allergies   Outpatient Medications Prior to Visit  Medication Sig Dispense Refill  . albuterol (VENTOLIN HFA) 108 (90 Base) MCG/ACT inhaler Inhale 2 puffs into the lungs every 6 (six) hours as needed for wheezing. 2 each 11  . Ascorbic Acid (VITAMIN C) 1000 MG tablet Take 1,000 mg by mouth daily.    Marland Kitchen atorvastatin (LIPITOR) 10 MG tablet TAKE 1 TABLET BY MOUTH EVERY DAY/LABS FOR REFILLS  (Patient taking differently: Take 10 mg by mouth daily.) 90 tablet 2  . Budeson-Glycopyrrol-Formoterol (BREZTRI AEROSPHERE) 160-9-4.8 MCG/ACT AERO Inhale 2 puffs into the lungs in the morning and at bedtime. 19.2 g 0  . ibuprofen (ADVIL) 600 MG tablet Take 1 tablet (600 mg total) by mouth every 8 (eight) hours as needed. (Patient taking differently: Take 600 mg by mouth every 8 (eight) hours as needed for mild pain.) 30 tablet 0  . Multiple Vitamins-Minerals (CENTRUM PO) Take 1 tablet by mouth daily.    Marland Kitchen Spacer/Aero-Holding Chambers DEVI USE WITH INHALER AS DIRECTED 1 each 0  . Budeson-Glycopyrrol-Formoterol (BREZTRI AEROSPHERE) 160-9-4.8 MCG/ACT AERO Inhale 2 puffs into the lungs in the morning and at bedtime. 4.8 g 0   No facility-administered medications prior to visit.    Review of Systems  Constitutional: Negative for chills, fever, malaise/fatigue and weight loss.  HENT: Negative for hearing loss, sore throat and tinnitus.   Eyes: Negative for blurred vision and double vision.  Respiratory: Positive for cough and shortness of breath. Negative for hemoptysis, sputum production, wheezing and stridor.   Cardiovascular: Negative for chest pain, palpitations, orthopnea, leg swelling and PND.  Gastrointestinal: Negative for abdominal pain, constipation, diarrhea, heartburn, nausea and vomiting.  Genitourinary: Negative for dysuria, hematuria and urgency.  Musculoskeletal: Negative for joint pain and myalgias.  Skin: Negative for itching and rash.  Neurological: Negative for dizziness, tingling, weakness and headaches.  Endo/Heme/Allergies: Negative for environmental allergies. Does not bruise/bleed easily.  Psychiatric/Behavioral: Negative for depression. The patient is not nervous/anxious and does not have insomnia.   All other systems reviewed and are negative.    Objective:  Physical Exam Vitals reviewed.  Constitutional:      General: He is not in acute distress.    Appearance:  He is well-developed.  HENT:     Head: Normocephalic and atraumatic.     Mouth/Throat:     Pharynx: No oropharyngeal exudate.  Eyes:     Conjunctiva/sclera: Conjunctivae normal.     Pupils: Pupils are equal, round, and reactive to light.  Neck:     Vascular: No JVD.     Trachea: No tracheal deviation.     Comments: Loss of supraclavicular fat Cardiovascular:     Rate and Rhythm: Normal rate and regular rhythm.     Heart sounds: S1 normal and S2 normal.     Comments: Distant heart tones Pulmonary:     Effort: No tachypnea or accessory muscle usage.     Breath sounds: No stridor. Decreased breath sounds (throughout all lung fields) present. No wheezing, rhonchi or rales.  Abdominal:     General: Bowel sounds are normal. There is no distension.     Palpations: Abdomen is  soft.     Tenderness: There is no abdominal tenderness.  Musculoskeletal:        General: Deformity (muscle wasting ) present.  Skin:    General: Skin is warm and dry.     Capillary Refill: Capillary refill takes less than 2 seconds.     Findings: No rash.  Neurological:     Mental Status: He is alert and oriented to person, place, and time.  Psychiatric:        Behavior: Behavior normal.      Vitals:   02/24/21 1632  BP: 120/70  Pulse: 62  Temp: 98.5 F (36.9 C)  TempSrc: Temporal  SpO2: 94%  Weight: 130 lb 3.2 oz (59.1 kg)  Height: 5\' 8"  (1.727 m)   94% on RA BMI Readings from Last 3 Encounters:  02/24/21 19.80 kg/m  12/16/20 18.42 kg/m  12/08/20 18.09 kg/m   Wt Readings from Last 3 Encounters:  02/24/21 130 lb 3.2 oz (59.1 kg)  12/16/20 121 lb 2 oz (54.9 kg)  12/08/20 119 lb (54 kg)     CBC    Component Value Date/Time   WBC 5.9 12/08/2020 0000   RBC 3.28 (L) 12/08/2020 0000   HGB 9.0 (L) 12/08/2020 0000   HCT 28.1 (L) 12/08/2020 0000   PLT 527 (H) 12/08/2020 0000   MCV 85.7 12/08/2020 0000   MCH 27.4 12/08/2020 0000   MCHC 32.0 12/08/2020 0000   RDW 13.2 12/08/2020 0000    LYMPHSABS 1,746 12/08/2020 0000   MONOABS 472 11/02/2016 1126   EOSABS 112 12/08/2020 0000   BASOSABS 30 12/08/2020 0000    Chest Imaging: Chest x-ray December 08, 2020: Decrease left-sided pleural effusion.  When compared to follow-up images from the hospital. The patient's images have been independently reviewed by me.    April 2022 CT chest: Pleural thickening on the left side resolution of the pleural effusion.  Nodule has also resolved.  As evidence of emphysema on CT. The patient's images have been independently reviewed by me.    Pulmonary Functions Testing Results: No flowsheet data found.  FeNO:   Pathology:   Echocardiogram:   Heart Catheterization:     Assessment & Plan:     ICD-10-CM   1. Former smoker  Z87.891 Ambulatory Referral for Lung Cancer Scre  2. Poor dentition  K08.9 Ambulatory referral to Dentistry  3. Pulmonary emphysema, unspecified emphysema type ([place])  J43.9   4. Centrilobular emphysema ([place])  J43.2 Pulmonary Function Test    Discussion:   This is an 86 year old gentleman with longstanding history of tobacco abuse quit in December 2021.  Was admitted to the hospital recently for parapneumonic effusion requiring a left-sided pigtail catheter status post drainage and treatment with tPA plus dornase.  He had a lung nodule that was also found at that time.  Also new diagnosis of COPD with evidence of centrilobular emphysema.  Plan: Ambulatory referral to dentistry, does have poor dentition and a history of a parapneumonic effusion. He has centrilobular emphysema and will remain on triple therapy inhaler regimen at this time. Refills given for inhaler PFTs in 1 year RTC in 1 year or as needed   Current Outpatient Medications:  .  albuterol (VENTOLIN HFA) 108 (90 Base) MCG/ACT inhaler, Inhale 2 puffs into the lungs every 6 (six) hours as needed for wheezing., Disp: 2 each, Rfl: 11 .  Ascorbic Acid (VITAMIN C) 1000 MG tablet, Take 1,000 mg by  mouth daily., Disp: , Rfl:  .  atorvastatin (  LIPITOR) 10 MG tablet, TAKE 1 TABLET BY MOUTH EVERY DAY/LABS FOR REFILLS (Patient taking differently: Take 10 mg by mouth daily.), Disp: 90 tablet, Rfl: 2 .  Budeson-Glycopyrrol-Formoterol (BREZTRI AEROSPHERE) 160-9-4.8 MCG/ACT AERO, Inhale 2 puffs into the lungs in the morning and at bedtime., Disp: 19.2 g, Rfl: 0 .  ibuprofen (ADVIL) 600 MG tablet, Take 1 tablet (600 mg total) by mouth every 8 (eight) hours as needed. (Patient taking differently: Take 600 mg by mouth every 8 (eight) hours as needed for mild pain.), Disp: 30 tablet, Rfl: 0 .  Multiple Vitamins-Minerals (CENTRUM PO), Take 1 tablet by mouth daily., Disp: , Rfl:  .  Spacer/Aero-Holding Chambers DEVI, USE WITH INHALER AS DIRECTED, Disp: 1 each, Rfl: 0    Garner Nash, DO [facility] 02/24/2021 4:45 PM

## 2021-03-10 ENCOUNTER — Other Ambulatory Visit: Payer: Self-pay

## 2021-03-10 ENCOUNTER — Encounter: Payer: Self-pay | Admitting: Osteopathic Medicine

## 2021-03-10 ENCOUNTER — Ambulatory Visit (INDEPENDENT_AMBULATORY_CARE_PROVIDER_SITE_OTHER): Payer: Medicare Other | Admitting: Osteopathic Medicine

## 2021-03-10 VITALS — BP 143/66 | HR 69 | Temp 97.8°F | Wt 132.1 lb

## 2021-03-10 DIAGNOSIS — L858 Other specified epidermal thickening: Secondary | ICD-10-CM | POA: Diagnosis not present

## 2021-03-10 DIAGNOSIS — N401 Enlarged prostate with lower urinary tract symptoms: Secondary | ICD-10-CM

## 2021-03-10 DIAGNOSIS — L989 Disorder of the skin and subcutaneous tissue, unspecified: Secondary | ICD-10-CM | POA: Diagnosis not present

## 2021-03-10 DIAGNOSIS — L570 Actinic keratosis: Secondary | ICD-10-CM | POA: Diagnosis not present

## 2021-03-10 MED ORDER — SALICYLIC ACID 3 % EX SHAM: 1.0000 "application " | MEDICATED_SHAMPOO | CUTANEOUS | 5 refills | Status: DC

## 2021-03-10 NOTE — Progress Notes (Signed)
Timothy Waller is a 87 y.o. male who presents to  [place] [facility]  today, 03/10/21, seeking care for the following:  . Removal skin lesion - forehead, actinic horn lesion.  . Scalp - noted on exam he has some waxy depisits, easily peel off, see pt instructions  . Question about urology - never head back about referral for trending up PSA         ASSESSMENT & PLAN with other pertinent findings:  The primary encounter diagnosis was Cutaneous horn. Diagnoses of Disorder of scalp and Benign localized prostatic hyperplasia with lower urinary tract symptoms (LUTS) were also pertinent to this visit.   1. Cutaneous horn Area cleand w/ alcohol, grasped lesion w/ forceps and removed w/ scissors and forceps, cryotherapy applied   2. Disorder of scalp Waxy sebaceous buildup, I think he's washing but not scrubbing, educated on gentle exfoliation and keep an eye on this issues  3. Benign localized prostatic hyperplasia with lower urinary tract symptoms (LUTS), elevated PSA  Referral already done, pt provided w/ phone number for urology and advised to call us if he still can't reach them    Patient Instructions  Abnormal skin frozen off today Keep area clean w/ soap/water Remaining abnormal skin will fall off New healthy skin should grow back Any concerns, let me know  Scalp buildup: Rx shampoo sent  [place] w/ soft washcloth to exfoliate few times per week   PSA level up from previous Uroloyg referral was sent in 10/2020 but no one called you! Please call [facility] at [phone]      No orders of the defined types were placed in this encounter.   Meds ordered this encounter  Medications  . Salicylic Acid 3 % SHAM    Sig: Apply 1 application topically 3 (three) times a week.    Dispense:  236 mL    Refill:  5     See below for relevant physical exam findings  See below for recent lab and imaging results  reviewed  Medications, allergies, PMH, PSH, SocH, FamH reviewed below    Follow-up instructions: Return in about 6 months (around 09/10/2021) for monitor chronic conditions, see [place] sooner if needed / depending how skin is healing.                                        Exam:  BP (!) 143/66 (BP Location: Left Arm, Patient Position: Sitting, Cuff Size: Small)   Pulse 69   Temp 97.8 F (36.6 C) (Oral)   Wt 132 lb 1.9 oz (59.9 kg)   BMI 20.09 kg/m   Constitutional: VS see above. General Appearance: alert, well-developed, well-nourished, NAD  Neck: No masses, trachea midline.   Respiratory: Normal respiratory effort.  Musculoskeletal: Gait normal.   Neurological: Normal balance/coordination. No tremor.  Skin: warm, dry, intact. See above   Psychiatric: Normal judgment/insight. Normal mood and affect. Oriented x3.   Current Meds  Medication Sig  . albuterol (VENTOLIN HFA) 108 (90 Base) MCG/ACT inhaler Inhale 2 puffs into the lungs every 6 (six) hours as needed for wheezing.  . Ascorbic Acid (VITAMIN C) 1000 MG tablet Take 1,000 mg by mouth daily.  Marland Kitchen atorvastatin (LIPITOR) 10 MG tablet TAKE 1 TABLET BY MOUTH EVERY DAY/LABS FOR REFILLS (Patient taking differently: Take 10 mg by mouth daily.)  . Budeson-Glycopyrrol-Formoterol (BREZTRI  AEROSPHERE) 160-9-4.8 MCG/ACT AERO Inhale 2 puffs into the lungs in the morning and at bedtime.  Marland Kitchen ibuprofen (ADVIL) 600 MG tablet Take 1 tablet (600 mg total) by mouth every 8 (eight) hours as needed. (Patient taking differently: Take 600 mg by mouth every 8 (eight) hours as needed for mild pain.)  . Multiple Vitamins-Minerals (CENTRUM PO) Take 1 tablet by mouth daily.  . Salicylic Acid 3 % SHAM Apply 1 application topically 3 (three) times a week.  Marland Kitchen Spacer/Aero-Holding Chambers DEVI USE WITH INHALER AS DIRECTED    No Known Allergies  Patient Active Problem List   Diagnosis Date Noted  . Lung nodule 11/29/2020   . Chest tube in place 11/29/2020  . CAP (community acquired pneumonia) 11/29/2020  . Pleural effusion 11/29/2020  . Cachexia ([place]) 11/29/2020  . Tobacco use 11/29/2020  . Centrilobular emphysema ([place]) 11/29/2020  . Empyema ([place]) 11/25/2020  . Benign localized prostatic hyperplasia with lower urinary tract symptoms (LUTS) 08/25/2020  . Skin lesion of face 10/29/2019  . Lightheadedness 10/29/2019  . Proteinuria 01/31/2018  . Frequent urination 01/31/2018  . Orthostasis 06/12/2017  . Heavy tobacco smoker 06/12/2017  . Benign colonic polyp 11/02/2016  . Needs flu shot 07/08/2016  . Osteopenia 02/17/2016  . Essential hypertension 02/09/2016  . Special screening for malignant neoplasms, colon 03/05/2015  . Hyperlipidemia 12/16/2014  . COPD with emphysema ([place]) 12/15/2014  . SHOULDER PAIN 08/24/2010  . HEMATURIA UNSPECIFIED 06/11/2009  . ERECTILE DYSFUNCTION, ORGANIC 06/11/2009  . SEBORRHEIC KERATOSIS 06/11/2009  . LUMBAGO 06/11/2009    Family History  Problem Relation Age of Onset  . Cancer Father        mouth cancer     Social History   Tobacco Use  Smoking Status Former Smoker  . Packs/day: 0.50  . Types: Cigarettes  . Quit date: 10/24/2020  . Years since quitting: 0.3  Smokeless Tobacco Never Used    Past Surgical History:  Procedure Laterality Date  . COLONOSCOPY    . INNER EAR SURGERY     hole in eardrum 30 years ago    Immunization History  Administered Date(s) Administered  . Fluad Quad(high Dose 65+) 07/03/2019, 07/13/2020  . Influenza, High Dose Seasonal PF 06/12/2017, 07/19/2018  . Influenza,inj,Quad PF,6+ Mos 07/08/2016  . Influenza-Unspecified 07/24/2014, 07/22/2015  . PFIZER(Purple Top)SARS-COV-2 Vaccination 11/08/2019, 11/29/2019, 09/21/2020  . Pneumococcal Polysaccharide-23 10/24/2005  . Pneumococcal-Unspecified 08/24/2014  . Td 10/24/2005  . Tdap 10/28/2015  . Zoster 02/07/2014    No results found for this or any previous visit (from the  past 2160 hour(s)).  No results found.     All questions at time of visit were answered - patient instructed to contact office with any additional concerns or updates. ER/RTC precautions were reviewed with the patient as applicable.   Please note: manual typing as well as voice recognition software may have been used to produce this document - typos may escape review. Please contact Dr. Sheppard Coil for any needed clarifications.

## 2021-03-10 NOTE — Patient Instructions (Addendum)
Abnormal skin frozen off today Keep area clean w/ soap/water Remaining abnormal skin will fall off New healthy skin should grow back Any concerns, let me know  Scalp buildup: Rx shampoo sent  [place] w/ soft washcloth to exfoliate few times per week   PSA level up from previous Uroloyg referral was sent in 10/2020 but no one called you! Please call [facility] at [phone]

## 2021-03-23 ENCOUNTER — Ambulatory Visit (INDEPENDENT_AMBULATORY_CARE_PROVIDER_SITE_OTHER): Payer: Medicare Other | Admitting: Osteopathic Medicine

## 2021-03-23 DIAGNOSIS — Z0000 Encounter for general adult medical examination without abnormal findings: Secondary | ICD-10-CM | POA: Diagnosis not present

## 2021-03-23 NOTE — Patient Instructions (Addendum)
[place] Maintenance Summary and Written Plan of Care  [name] ,  Thank you for allowing me to perform your Medicare Annual Wellness Visit and for your ongoing commitment to your health.   Health Maintenance & Immunization History Health Maintenance  Topic Date Due  . COVID-19 Vaccine (4 - Booster for Pfizer series) 04/08/2021 (Originally 12/21/2020)  . Zoster Vaccines- Shingrix (1 of 2) 06/23/2021 (Originally 09/10/1986)  . PNA vac Low Risk Adult (2 of 2 - PCV13) 10/27/2021 (Originally 08/25/2015)  . INFLUENZA VACCINE  05/24/2021  . TETANUS/TDAP  10/27/2025  . HPV VACCINES  Aged Out   Immunization History  Administered Date(s) Administered  . Fluad Quad(high Dose 65+) 07/03/2019, 07/13/2020  . Influenza, High Dose Seasonal PF 06/12/2017, 07/19/2018  . Influenza,inj,Quad PF,6+ Mos 07/08/2016  . Influenza-Unspecified 07/24/2014, 07/22/2015  . PFIZER(Purple Top)SARS-COV-2 Vaccination 11/08/2019, 11/29/2019, 09/21/2020  . Pneumococcal Polysaccharide-23 10/24/2005  . Pneumococcal-Unspecified 08/24/2014  . Td 10/24/2005  . Tdap 10/28/2015  . Zoster, Live 02/07/2014    These are the patient goals that we discussed: Goals Addressed              This Visit's Progress   .  Patient Stated (pt-stated)        03/23/2021 AWV Goal: Exercise for General Health   Patient will verbalize understanding of the benefits of increased physical activity:  Exercising regularly is important. It will improve your overall fitness, flexibility, and endurance.  Regular exercise also will improve your overall health. It can help you control your weight, reduce stress, and improve your bone density.  Over the next year, patient will increase physical activity as tolerated with a goal of at least 150 minutes of moderate physical activity per week.   You can tell that you are exercising at a moderate intensity if your heart starts beating faster and you start breathing  faster but can still hold a conversation.  Moderate-intensity exercise ideas include:  Walking 1 mile (1.6 km) in about 15 minutes  Biking  Hiking  Golfing  Dancing  Water aerobics  Patient will verbalize understanding of everyday activities that increase physical activity by providing examples like the following: ? Yard work, such as: ? Pushing a Conservation officer, nature ? Raking and bagging leaves ? Washing your car ? Pushing a stroller ? Shoveling snow ? Gardening ? Washing windows or floors  Patient will be able to explain general safety guidelines for exercising:   Before you start a new exercise program, talk with your health care provider.  Do not exercise so much that you hurt yourself, feel dizzy, or get very short of breath.  Wear comfortable clothes and wear shoes with good support.  Drink plenty of water while you exercise to prevent dehydration or heat stroke.  Work out until your breathing and your heartbeat get faster.         This is a list of Health Maintenance Items that are overdue or due now: Pneumococcal vaccine  Shingrix, eye exam.  Orders/Referrals Placed Today: No orders of the defined types were placed in this encounter.  (Contact our referral department at [phone] if you have not spoken with someone about your referral appointment within the next 5 days)    Follow-up Plan . Follow-up with Emeterio Reeve, DO as planned . Schedule your eye exam. . Schedule your appointment at the pharmacy for your shingles vaccine.  . Pneumonia vaccine can be done at your next in-office visit or you can schedule a nurse  visit. . AVS printed and mailed to the patient. . Medicare wellness in one year.       Health Maintenance, Male Adopting a healthy lifestyle and getting preventive care are important in promoting health and wellness. Ask your health care provider about:  The right schedule for you to have regular tests and exams.  Things you can  do on your own to prevent diseases and keep yourself healthy. What should I know about diet, weight, and exercise? Eat a healthy diet  Eat a diet that includes plenty of vegetables, fruits, low-fat dairy products, and lean protein.  Do not eat a lot of foods that are high in solid fats, added sugars, or sodium.   Maintain a healthy weight Body mass index (BMI) is a measurement that can be used to identify possible weight problems. It estimates body fat based on height and weight. Your health care provider can help determine your BMI and help you achieve or maintain a healthy weight. Get regular exercise Get regular exercise. This is one of the most important things you can do for your health. Most adults should:  Exercise for at least 150 minutes each week. The exercise should increase your heart rate and make you sweat (moderate-intensity exercise).  Do strengthening exercises at least twice a week. This is in addition to the moderate-intensity exercise.  Spend less time sitting. Even light physical activity can be beneficial. Watch cholesterol and blood lipids Have your blood tested for lipids and cholesterol at 85 years of age, then have this test every 5 years. You may need to have your cholesterol levels checked more often if:  Your lipid or cholesterol levels are high.  You are older than 85 years of age.  You are at high risk for heart disease. What should I know about cancer screening? Many types of cancers can be detected early and may often be prevented. Depending on your health history and family history, you may need to have cancer screening at various ages. This may include screening for:  Colorectal cancer.  Prostate cancer.  Skin cancer.  Lung cancer. What should I know about heart disease, diabetes, and high blood pressure? Blood pressure and heart disease  High blood pressure causes heart disease and increases the risk of stroke. This is more likely to develop  in people who have high blood pressure readings, are of African descent, or are overweight.  Talk with your health care provider about your target blood pressure readings.  Have your blood pressure checked: ? Every 3-5 years if you are 21-94 years of age. ? Every year if you are 68 years old or older.  If you are between the ages of 46 and 52 and are a current or former smoker, ask your health care provider if you should have a one-time screening for abdominal aortic aneurysm (AAA). Diabetes Have regular diabetes screenings. This checks your fasting blood sugar level. Have the screening done:  Once every three years after age 60 if you are at a normal weight and have a low risk for diabetes.  More often and at a younger age if you are overweight or have a high risk for diabetes. What should I know about preventing infection? Hepatitis B If you have a higher risk for hepatitis B, you should be screened for this virus. Talk with your health care provider to find out if you are at risk for hepatitis B infection. Hepatitis C Blood testing is recommended for:  Everyone born from  1943 through [birth year].  Anyone with known risk factors for hepatitis C. Sexually transmitted infections (STIs)  You should be screened each year for STIs, including gonorrhea and chlamydia, if: ? You are sexually active and are younger than 85 years of age. ? You are older than 85 years of age and your health care provider tells you that you are at risk for this type of infection. ? Your sexual activity has changed since you were last screened, and you are at increased risk for chlamydia or gonorrhea. Ask your health care provider if you are at risk.  Ask your health care provider about whether you are at high risk for HIV. Your health care provider may recommend a prescription medicine to help prevent HIV infection. If you choose to take medicine to prevent HIV, you should first get tested for HIV. You should then be  tested every 3 months for as long as you are taking the medicine. Follow these instructions at home: Lifestyle  Do not use any products that contain nicotine or tobacco, such as cigarettes, e-cigarettes, and chewing tobacco. If you need help quitting, ask your health care provider.  Do not use street drugs.  Do not share needles.  Ask your health care provider for help if you need support or information about quitting drugs. Alcohol use  Do not drink alcohol if your health care provider tells you not to drink.  If you drink alcohol: ? Limit how much you have to 0-2 drinks a day. ? Be aware of how much alcohol is in your drink. In the [place] one drink equals one 12 oz bottle of beer (355 mL), one 5 oz glass of wine (148 mL), or one 1 oz glass of hard liquor (44 mL). General instructions  Schedule regular health, dental, and eye exams.  Stay current with your vaccines.  Tell your health care provider if: ? You often feel depressed. ? You have ever been abused or do not feel safe at home. Summary  Adopting a healthy lifestyle and getting preventive care are important in promoting health and wellness.  Follow your health care provider's instructions about healthy diet, exercising, and getting tested or screened for diseases.  Follow your health care provider's instructions on monitoring your cholesterol and blood pressure. This information is not intended to replace advice given to you by your health care provider. Make sure you discuss any questions you have with your health care provider. Document Revised: 10/03/2018 Document Reviewed: 10/03/2018 Elsevier Patient Education  2021 [employer].

## 2021-03-23 NOTE — Progress Notes (Signed)
MEDICARE ANNUAL WELLNESS VISIT  03/23/2021  Telephone Visit Disclaimer This Medicare AWV was conducted by telephone due to national recommendations for restrictions regarding the COVID-19 Pandemic (e.g. social distancing).  I verified, using two identifiers, that I am speaking with Timothy Waller or their authorized healthcare agent. I discussed the limitations, risks, security, and privacy concerns of performing an evaluation and management service by telephone and the potential availability of an in-person appointment in the future. The patient expressed understanding and agreed to proceed.  Location of Patient: Home Location of Provider (nurse):  In the office.  Subjective:    Timothy Waller is a 87 y.o. male patient of Emeterio Reeve, DO who had a Medicare Annual Wellness Visit today via telephone. Kayven is Retired and lives with their spouse. he has 4 children. he reports that he is socially active and does interact with friends/family regularly. he is minimally physically active and enjoys watching tv and little yard work.  Patient Care Team: Emeterio Reeve, DO as PCP - General (Osteopathic Medicine)  Advanced Directives 03/23/2021 11/25/2020 05/03/2016  Does Patient Have a Medical Advance Directive? Yes Yes Yes  Type of Advance Directive Living will;Healthcare Power of Attorney Living will -  Does patient want to make changes to medical advance directive? No - Patient declined No - Patient declined -  Copy of [place] in Chart? No - copy requested - No - copy requested    Hospital Utilization Over the Past 12 Months: # of hospitalizations or ER visits: 1 # of surgeries: 0  Review of Systems    Patient reports that his overall health is better compared to last year.  History obtained from chart review and the patient  Patient Reported Readings (BP, Pulse, CBG, Weight, etc) none  Pain Assessment Pain : No/denies pain     Current Medications  & Allergies (verified) Allergies as of 03/23/2021   No Known Allergies     Medication List       Accurate as of Mar 23, 2021  9:22 AM. If you have any questions, ask your nurse or doctor.        albuterol 108 (90 Base) MCG/ACT inhaler Commonly known as: VENTOLIN HFA Inhale 2 puffs into the lungs every 6 (six) hours as needed for wheezing.   atorvastatin 10 MG tablet Commonly known as: LIPITOR TAKE 1 TABLET BY MOUTH EVERY DAY/LABS FOR REFILLS What changed: See the new instructions.   Breztri Aerosphere 160-9-4.8 MCG/ACT Aero Generic drug: Budeson-Glycopyrrol-Formoterol Inhale 2 puffs into the lungs in the morning and at bedtime. What changed: additional instructions   CENTRUM PO Take 1 tablet by mouth daily.   ibuprofen 600 MG tablet Commonly known as: ADVIL Take 1 tablet (600 mg total) by mouth every 8 (eight) hours as needed. What changed: reasons to take this   Salicylic Acid 3 % Sham Apply 1 application topically 3 (three) times a week.   Spacer/Aero-Holding Dorise Bullion USE WITH INHALER AS DIRECTED   vitamin C 1000 MG tablet Take 1,000 mg by mouth daily.       History (reviewed): Past Medical History:  Diagnosis Date  . Abnormal weight loss   . COPD (chronic obstructive pulmonary disease) ([place])   . Hyperlipidemia   . Osteoporosis 09/01/2010   Qualifier: Diagnosis of  By: Renae Fickle CMA, (AAMA), Seth Bake     Past Surgical History:  Procedure Laterality Date  . COLONOSCOPY    . INNER EAR SURGERY     hole  in eardrum 30 years ago   Family History  Problem Relation Age of Onset  . Cancer Father        mouth cancer    Social History   Socioeconomic History  . Marital status: Married    Spouse name: Anne Ng  . Number of children: 4  . Years of education: 42  . Highest education level: 12th grade  Occupational History    Comment: Retired.  Tobacco Use  . Smoking status: Former Smoker    Packs/day: 0.50    Types: Cigarettes    Quit date: 10/24/2020     Years since quitting: 0.4  . Smokeless tobacco: Never Used  Vaping Use  . Vaping Use: Never used  Substance and Sexual Activity  . Alcohol use: Yes    Alcohol/week: 0.0 standard drinks    Comment: 2-3 drinks per week   . Drug use: No  . Sexual activity: Not Currently  Other Topics Concern  . Not on file  Social History Narrative   Lives with his wife. He has four children. He likes to watch tv and do a little yard work in his free time.   Social Determinants of Health   Financial Resource Strain: Low Risk   . Difficulty of Paying Living Expenses: Not hard at all  Food Insecurity: No Food Insecurity  . Worried About Charity fundraiser in the Last Year: Never true  . Ran Out of Food in the Last Year: Never true  Transportation Needs: No Transportation Needs  . Lack of Transportation (Medical): No  . Lack of Transportation (Non-Medical): No  Physical Activity: Inactive  . Days of Exercise per Week: 0 days  . Minutes of Exercise per Session: 0 min  Stress: No Stress Concern Present  . Feeling of Stress : Not at all  Social Connections: Moderately Integrated  . Frequency of Communication with Friends and Family: Twice a week  . Frequency of Social Gatherings with Friends and Family: Twice a week  . Attends Religious Services: 1 to 4 times per year  . Active Member of Clubs or Organizations: No  . Attends Archivist Meetings: Never  . Marital Status: Married    Activities of Daily Living In your present state of health, do you have any difficulty performing the following activities: 03/23/2021 11/25/2020  Hearing? N Y  Vision? N Y  Difficulty concentrating or making decisions? N N  Walking or climbing stairs? N N  Dressing or bathing? N N  Doing errands, shopping? N N  Preparing Food and eating ? N -  Using the Toilet? N -  In the past six months, have you accidently leaked urine? N -  Do you have problems with loss of bowel control? N -  Managing your  Medications? N -  Managing your Finances? N -  Housekeeping or managing your Housekeeping? N -  Some recent data might be hidden    Patient Education/ Literacy How often do you need to have someone help you when you read instructions, pamphlets, or other written materials from your doctor or pharmacy?: 1 - Never What is the last grade level you completed in school?: 12th grade  Exercise Current Exercise Habits: The patient does not participate in regular exercise at present, Exercise limited by: None identified  Diet Patient reports consuming 2 meals a day and 2 snack(s) a day Patient reports that his primary diet is: Regular Patient reports that she does have regular access to food.  Depression Screen PHQ 2/9 Scores 03/23/2021 07/03/2019 01/31/2018 06/20/2017  PHQ - 2 Score 0 0 0 0  PHQ- 9 Score - - 0 -     Fall Risk Fall Risk  03/23/2021 07/03/2019 05/27/2019 06/20/2017 06/20/2016  Falls in the past year? 0 0 0 No No  Comment - - Emmi Telephone Survey: data to providers prior to load - Emmi Telephone Survey: data to providers prior to load  Number falls in past yr: 0 0 - - -  Injury with Fall? 0 0 - - -  Risk for fall due to : No Fall Risks - - - -  Follow up Falls evaluation completed - - - -     Objective:  Timothy Mate Lamay seemed alert and oriented and he participated appropriately during our telephone visit.  Blood Pressure Weight BMI  BP Readings from Last 3 Encounters:  03/10/21 (!) 143/66  02/24/21 120/70  12/16/20 124/64   Wt Readings from Last 3 Encounters:  03/10/21 132 lb 1.9 oz (59.9 kg)  02/24/21 130 lb 3.2 oz (59.1 kg)  12/16/20 121 lb 2 oz (54.9 kg)   BMI Readings from Last 1 Encounters:  03/10/21 20.09 kg/m    *Unable to obtain current vital signs, weight, and BMI due to telephone visit type  Hearing/Vision  . Fallon did not seem to have difficulty with hearing/understanding during the telephone conversation . Reports that he has not had a formal eye exam  by an eye care professional within the past year . Reports that he has not had a formal hearing evaluation within the past year *Unable to fully assess hearing and vision during telephone visit type  Cognitive Function: 6CIT Screen 03/23/2021  What Year? 0 points  What month? 0 points  What time? 0 points  Count back from 20 0 points  Months in reverse 0 points  Repeat phrase 2 points  Total Score 2   (Normal:0-7, Significant for Dysfunction: >8)  Normal Cognitive Function Screening: Yes   Immunization & Health Maintenance Record Immunization History  Administered Date(s) Administered  . Fluad Quad(high Dose 65+) 07/03/2019, 07/13/2020  . Influenza, High Dose Seasonal PF 06/12/2017, 07/19/2018  . Influenza,inj,Quad PF,6+ Mos 07/08/2016  . Influenza-Unspecified 07/24/2014, 07/22/2015  . PFIZER(Purple Top)SARS-COV-2 Vaccination 11/08/2019, 11/29/2019, 09/21/2020  . Pneumococcal Polysaccharide-23 10/24/2005  . Pneumococcal-Unspecified 08/24/2014  . Td 10/24/2005  . Tdap 10/28/2015  . Zoster, Live 02/07/2014    Health Maintenance  Topic Date Due  . COVID-19 Vaccine (4 - Booster for Pfizer series) 04/08/2021 (Originally 12/21/2020)  . Zoster Vaccines- Shingrix (1 of 2) 06/23/2021 (Originally 09/10/1986)  . PNA vac Low Risk Adult (2 of 2 - PCV13) 10/27/2021 (Originally 08/25/2015)  . INFLUENZA VACCINE  05/24/2021  . TETANUS/TDAP  10/27/2025  . HPV VACCINES  Aged Out       Assessment  This is a routine wellness examination for Odel Schmid Gillin.  Health Maintenance: Due or Overdue There are no preventive care reminders to display for this patient.  Timothy Mate Bonham does not need a referral for Community Assistance: Care Management:   no Social Work:    no Prescription Assistance:  no Nutrition/Diabetes Education:  no   Plan:  Personalized Goals Goals Addressed              This Visit's Progress   .  Patient Stated (pt-stated)        03/23/2021 AWV Goal: Exercise  for General Health   Patient will verbalize understanding of the benefits  of increased physical activity:  Exercising regularly is important. It will improve your overall fitness, flexibility, and endurance.  Regular exercise also will improve your overall health. It can help you control your weight, reduce stress, and improve your bone density.  Over the next year, patient will increase physical activity as tolerated with a goal of at least 150 minutes of moderate physical activity per week.   You can tell that you are exercising at a moderate intensity if your heart starts beating faster and you start breathing faster but can still hold a conversation.  Moderate-intensity exercise ideas include:  Walking 1 mile (1.6 km) in about 15 minutes  Biking  Hiking  Golfing  Dancing  Water aerobics  Patient will verbalize understanding of everyday activities that increase physical activity by providing examples like the following: ? Yard work, such as: ? Pushing a Conservation officer, nature ? Raking and bagging leaves ? Washing your car ? Pushing a stroller ? Shoveling snow ? Gardening ? Washing windows or floors  Patient will be able to explain general safety guidelines for exercising:   Before you start a new exercise program, talk with your health care provider.  Do not exercise so much that you hurt yourself, feel dizzy, or get very short of breath.  Wear comfortable clothes and wear shoes with good support.  Drink plenty of water while you exercise to prevent dehydration or heat stroke.  Work out until your breathing and your heartbeat get faster.       Personalized Health Maintenance & Screening Recommendations  Pneumococcal vaccine  Shingrix, eye exam.  Lung Cancer Screening Recommended: no (Low Dose CT Chest recommended if Age 54-80 years, 30 pack-year currently smoking OR have quit w/in past 15 years) Hepatitis C Screening recommended: no HIV Screening recommended:  no  Advanced Directives: Written information was not prepared per patient's request.  Referrals & Orders No orders of the defined types were placed in this encounter.   Follow-up Plan  . Follow-up with Emeterio Reeve, DO as planned . Schedule your eye exam. . Schedule your appointment at the pharmacy for your shingles vaccine.  . Pneumonia vaccine can be done at your next in-office visit or you can schedule a nurse visit. . AVS printed and mailed to the patient. . Medicare wellness in one year.   I have personally reviewed and noted the following in the patient's chart:   . Medical and social history . Use of alcohol, tobacco or illicit drugs  . Current medications and supplements . Functional ability and status . Nutritional status . Physical activity . Advanced directives . List of other physicians . Hospitalizations, surgeries, and ER visits in previous 12 months . Vitals . Screenings to include cognitive, depression, and falls . Referrals and appointments  In addition, I have reviewed and discussed with Timothy Mate Bissonnette certain preventive protocols, quality metrics, and best practice recommendations. A written personalized care plan for preventive services as well as general preventive health recommendations is available and can be mailed to the patient at his request.      Tinnie Gens, RN  03/23/2021

## 2021-04-27 ENCOUNTER — Telehealth: Payer: Self-pay | Admitting: Pulmonary Disease

## 2021-04-27 NOTE — Telephone Encounter (Signed)
Spoke with Lelon Frohlich, ok per DPR  She states pt's spouse told her that pt having increased cough and night sweats past 2 days  She did not have further details but is concerned about pt  I called the pt and spoke with pt- he said he is coughing some and feels like he has no energy the past 2 wks  He denied having any night sweats, aches, fever, increased SOB, CP, or other symptoms  His cough is non prod  I offered to schedule him clinic visit for next wk  He states does not feel he needs appt  I advised can try delsym for his cough and to let us know if not improving or gets worse  Forwarding to Dr Valeta Harms as Juluis Rainier

## 2021-04-28 ENCOUNTER — Telehealth: Payer: Self-pay | Admitting: Pulmonary Disease

## 2021-04-28 NOTE — Telephone Encounter (Signed)
Ok thanks. If he gets worse let us know. Happy to see him  Garner Nash, DO  [facility]  04/27/2021 8:20 PM    Pt aware to contact the office should his symptoms get worse or not improving.

## 2021-04-28 NOTE — Telephone Encounter (Signed)
Called and spoke with pt about the message. Pt said that he is feeling better. Spoke with our office yesterday after he sent the [employer].  Nothing further needed.

## 2021-06-19 IMAGING — CT CT CHEST W/O CM
2 of 4 series · 14 of 36 positions shown, 17 images · non-contrast
Comparison: November 28, 2020

CLINICAL DATA: Follow-up of parapneumonic effusion

EXAM:
CT CHEST WITHOUT CONTRAST
TECHNIQUE: Multidetector CT imaging of the chest was performed following the
standard protocol without IV contrast.

[Series 2: thorax · axial · 0.62mm/px · z∈[-326,-56]mm · 11 of 161 slices shown, 14 images]
[im 13/161  mediastinal]
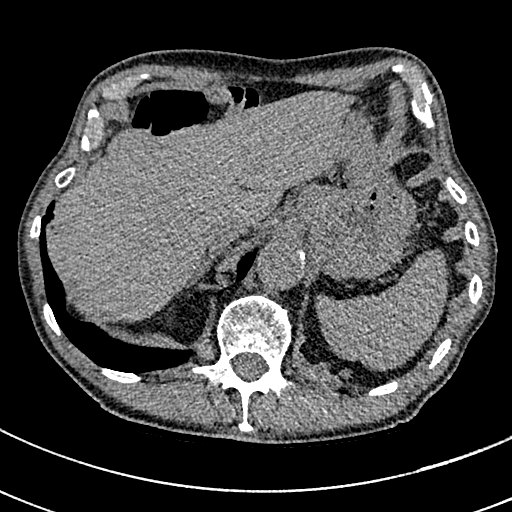
[im 13/161  lung]
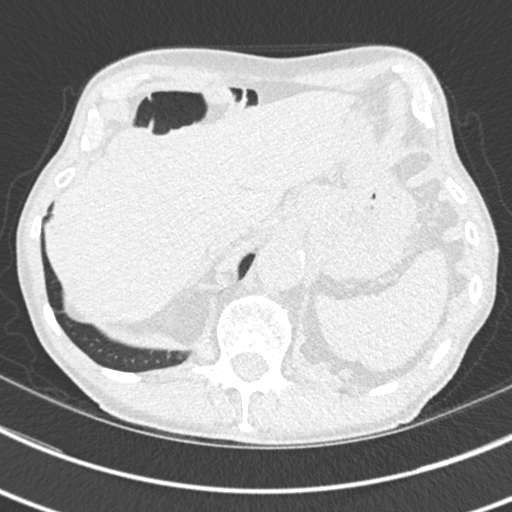
[im 25/161  lung]
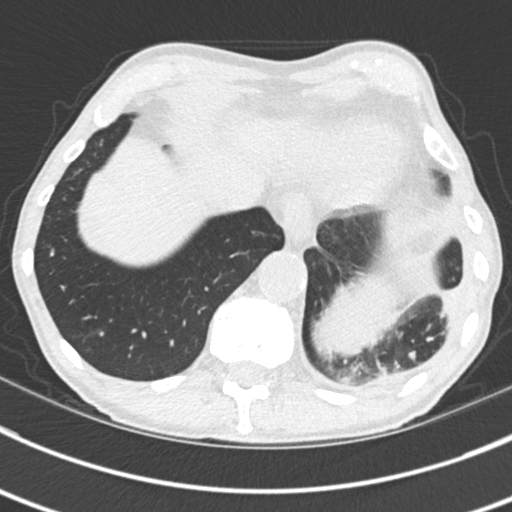
[im 37/161  lung]
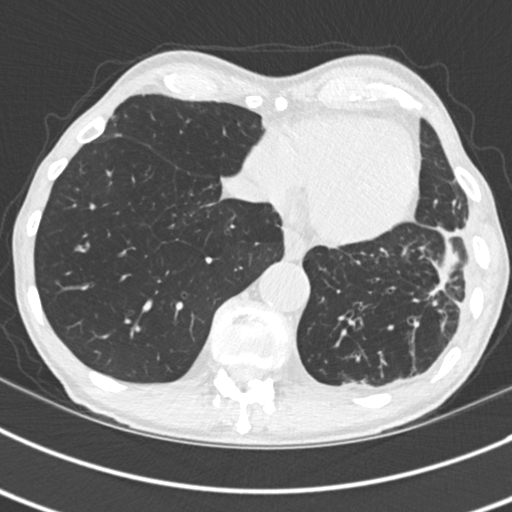
[im 50/161  lung]
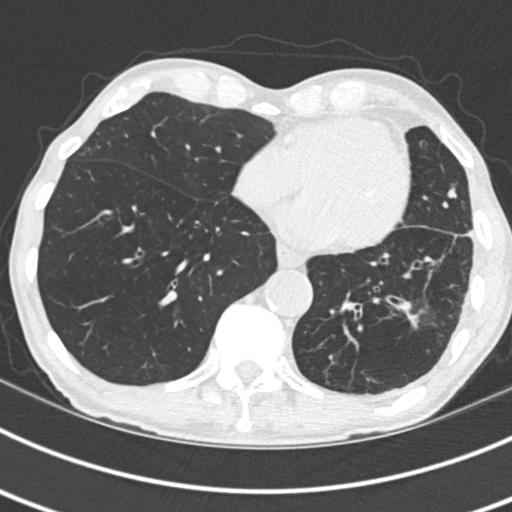
[im 62/161  mediastinal]
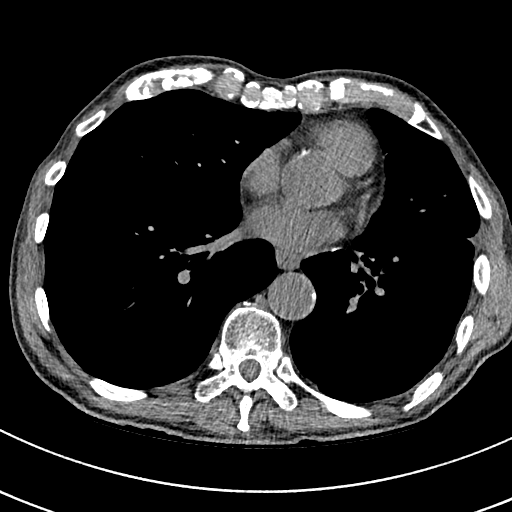
[im 62/161  lung]
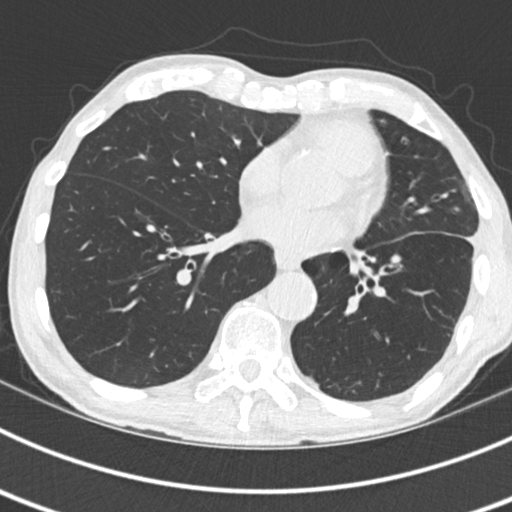
[im 87/161  lung]
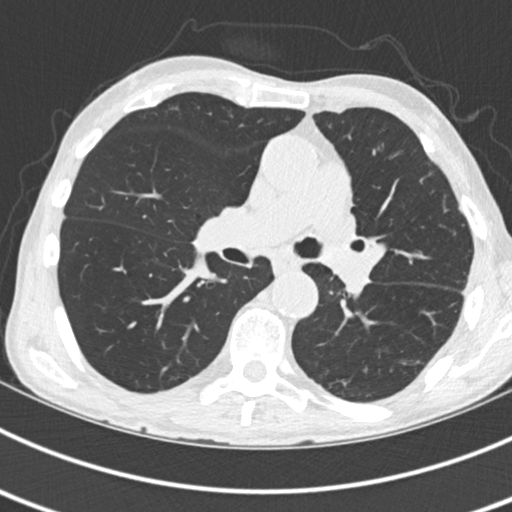
[im 99/161  lung]
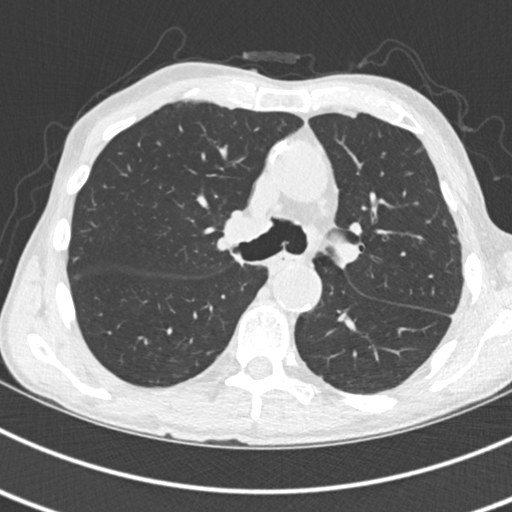
[im 111/161  lung]
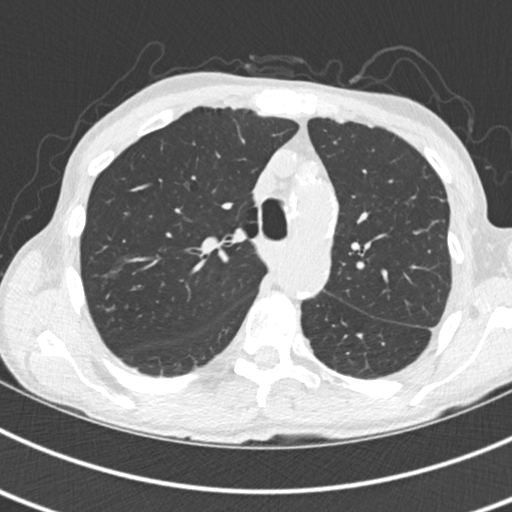
[im 124/161  mediastinal]
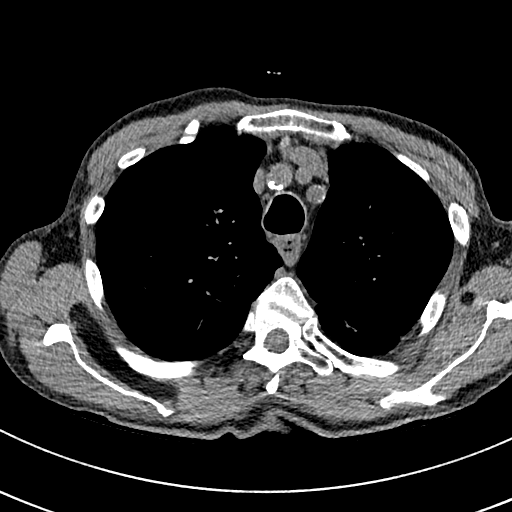
[im 124/161  lung]
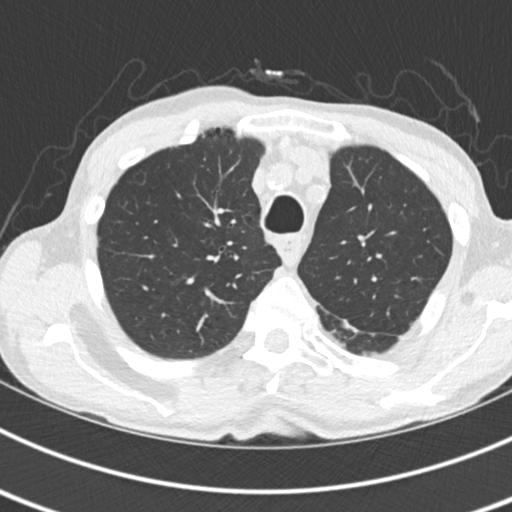
[im 136/161  lung]
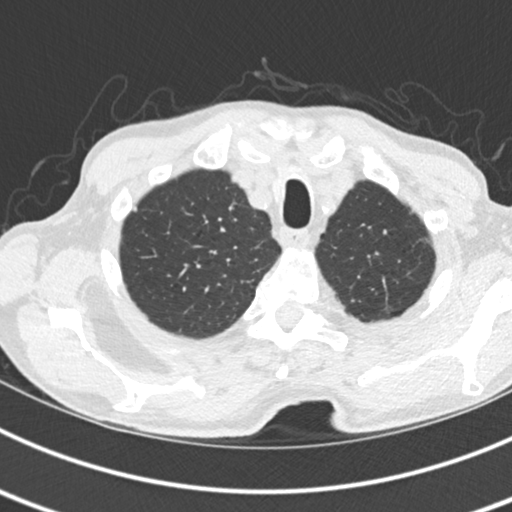
[im 148/161  lung]
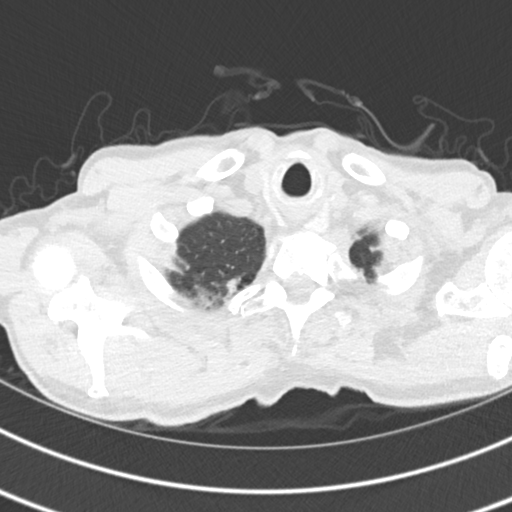

[Series 5: coronal · coronal · 0.66mm/px · 3 of 115 slices shown]
[im 23/115  lung]
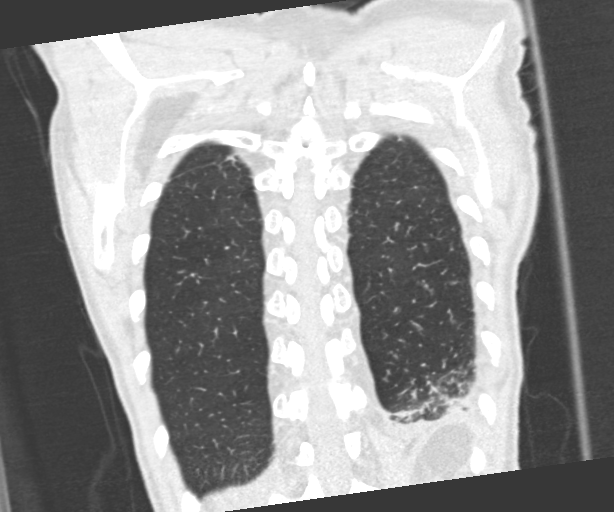
[im 46/115  lung]
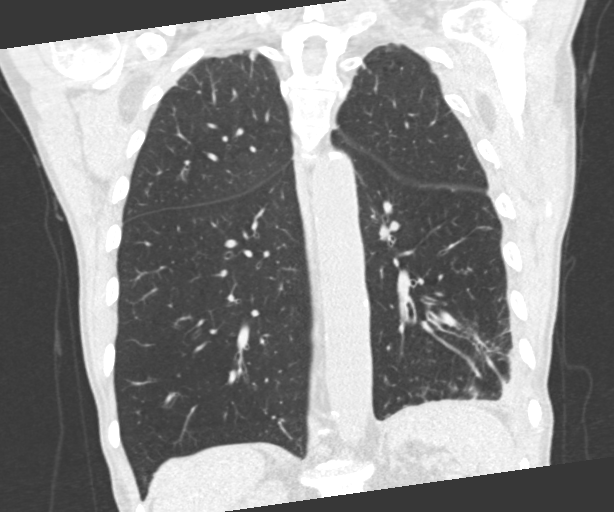
[im 69/115  lung]
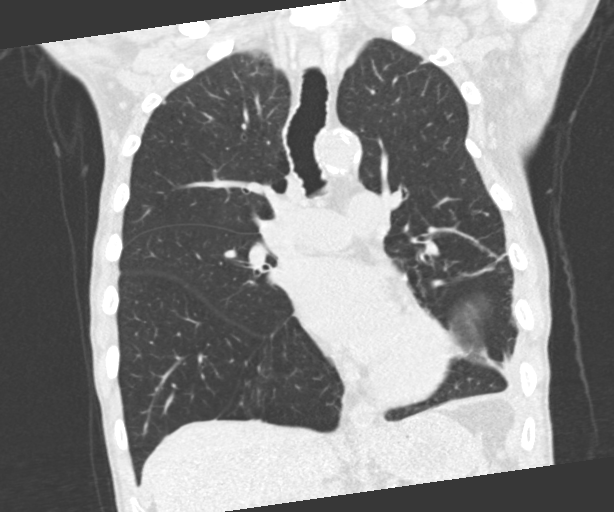

[14 of 36 positions shown; findings below may reference images not displayed]

FINDINGS: Cardiovascular: Calcified atheromatous plaque of the thoracic aorta.
Heart size stable without pericardial effusion. Central pulmonary
vasculature is normal caliber. No aneurysmal dilation of the
thoracic aorta. Limited assessment of cardiovascular structures
given lack of intravenous contrast.

Mediastinum/Nodes: Thoracic inlet structures are normal. No axillary
lymphadenopathy. No mediastinal lymphadenopathy. No gross hilar
adenopathy. Mild esophageal thickening suggested throughout the
esophagus. No adjacent stranding.

Lungs/Pleura: Apical pleural and parenchymal scarring, paraseptal
predominant emphysema and bronchial wall thickening with similar
appearance to previous imaging. No pneumothorax. Removal of the
LEFT-sided chest tube since the prior study. Signs of pleural
thickening with minimal fluid along the LEFT lateral chest nearly
completely resolved. Bronchial wall thickening to the LEFT lung base
with similar appearance. Areas of tree-in-bud nodularity and
improved aeration at the LEFT lung base compared to the prior study.
Minimal bronchial wall thickening of the lingula with inspissated
secretions within lingular bronchials. Material within the LEFT
mainstem bronchus and distal trachea with similar appearance to
prior imaging.

The irregular nodule seen on previous imaging now 0.9 x 0.6 cm
previously noted irregular nodule in this area measuring 2.4 x
cm on the previous study.

Scattered small nodules at the RIGHT lung base are stable.
Tree-in-bud opacities in the RIGHT upper chest also unchanged in the
RIGHT upper lobe at the periphery

Upper Abdomen: Incidental imaging of upper abdominal contents
without acute process.

Musculoskeletal: Lipoma in RIGHT subscapularis muscle similar to
previous imaging.
IMPRESSION: 1. Near complete resolution of pleural fluid in the LEFT chest with
signs of pleural thickening and improved aeration following chest
tube removal.
2. Post infectious scarring at the LEFT lung base. Irregular nodule
at the LEFT lung base more likely related to prior infection and is
much smaller than on the prior study.
3. Sequela of chronic infection suspected in the background
potentially also associated pneumonitis from microaspiration with
areas of tree-in-bud opacity and micro nodularity.
4. Material within the LEFT mainstem bronchus and distal trachea
with similar appearance to prior imaging.
5. Mild esophageal thickening suggested throughout the esophagus.
Correlate with any clinical evidence of esophagitis.
6. Emphysema and aortic atherosclerosis.

## 2021-07-12 ENCOUNTER — Encounter: Payer: Self-pay | Admitting: Osteopathic Medicine

## 2021-07-12 ENCOUNTER — Ambulatory Visit (INDEPENDENT_AMBULATORY_CARE_PROVIDER_SITE_OTHER): Payer: Medicare Other

## 2021-07-12 ENCOUNTER — Ambulatory Visit (INDEPENDENT_AMBULATORY_CARE_PROVIDER_SITE_OTHER): Payer: Medicare Other | Admitting: Osteopathic Medicine

## 2021-07-12 ENCOUNTER — Other Ambulatory Visit: Payer: Self-pay

## 2021-07-12 VITALS — BP 134/64 | HR 71 | Temp 98.4°F | Wt 134.1 lb

## 2021-07-12 DIAGNOSIS — F17200 Nicotine dependence, unspecified, uncomplicated: Secondary | ICD-10-CM

## 2021-07-12 DIAGNOSIS — R0989 Other specified symptoms and signs involving the circulatory and respiratory systems: Secondary | ICD-10-CM | POA: Diagnosis not present

## 2021-07-12 DIAGNOSIS — J984 Other disorders of lung: Secondary | ICD-10-CM | POA: Diagnosis not present

## 2021-07-12 DIAGNOSIS — I700 Atherosclerosis of aorta: Secondary | ICD-10-CM | POA: Diagnosis not present

## 2021-07-12 DIAGNOSIS — J439 Emphysema, unspecified: Secondary | ICD-10-CM | POA: Diagnosis not present

## 2021-07-12 DIAGNOSIS — R918 Other nonspecific abnormal finding of lung field: Secondary | ICD-10-CM | POA: Diagnosis not present

## 2021-07-12 DIAGNOSIS — R911 Solitary pulmonary nodule: Secondary | ICD-10-CM

## 2021-07-12 NOTE — Progress Notes (Signed)
Timothy Waller is a 87 y.o. male who presents to  [place] [facility]  today, 07/12/21, seeking care for the following:  CHEST CONGESTION - pt is concerned for COPD, possibly recurrent pleural effusion (hospitalized for this 11/2020 and following w/ pulmonary since). Reports no bad SOB but feeling more congested, nonproductive cough, no fever, no DOE. Has been off his Breztri for a month or so   ASSESSMENT & PLAN with other pertinent findings:  The primary encounter diagnosis was Chest congestion. Diagnoses of Hx Heavy tobacco smoker and Pulmonary emphysema, unspecified emphysema type ([place]) were also pertinent to this visit.   On exam, mild coarse breath sounds bilateral bases and maybe slightly worse on LLL, CXR as below looks ok. I think feeling a bit worse off his maintenance inhaler, advised restart this and can use albuterol few times per day in the meantime.    CXR on personal review Cardiomediastinal silhouette/heart size:  bit of mild narrowing cardiac silhouette c/w COPD Obvious bony abnormality: none Infiltrate: none Mass or other opacity: none Atelectasis: none Diaphragms:  flat Lateral view:  no concerns  Images were reviewed with the patient. Pt counseled that radiologist will review the images as well, our office will call if the formal read reveals any significant findings other than what has been noted above.   There are no Patient Instructions on file for this visit.  Orders Placed This Encounter  Procedures   DG Chest 2 View    No orders of the defined types were placed in this encounter.    See below for relevant physical exam findings  See below for recent lab and imaging results reviewed  Medications, allergies, PMH, PSH, SocH, FamH reviewed below    Follow-up instructions: Return for MOVE CURRENTLY [place] [facility] [place]  .                                        Exam:  BP 134/64 (BP Location: Left Arm, Patient Position: Sitting, Cuff Size: Normal)   Pulse 71   Temp 98.4 F (36.9 C) (Oral)   Wt 134 lb 2 oz (60.8 kg)   BMI 20.39 kg/m  Constitutional: VS see above. General Appearance: alert, well-developed, well-nourished, NAD Neck: No masses, trachea midline.  Respiratory: Normal respiratory effort. Mild coarse breath sounds bilateral bases a bit worse on L, no wheeze, no rhonchi, no rales Cardiovascular: S1/S2 normal, no murmur, no rub/gallop auscultated. RRR.  Musculoskeletal: Gait normal. Symmetric and independent movement of all extremities Neurological: Normal balance/coordination. No tremor. Skin: warm, dry, intact.  Psychiatric: Normal judgment/insight. Normal mood and affect. Oriented x3.   Current Meds  Medication Sig   albuterol (VENTOLIN HFA) 108 (90 Base) MCG/ACT inhaler Inhale 2 puffs into the lungs every 6 (six) hours as needed for wheezing.   Ascorbic Acid (VITAMIN C) 1000 MG tablet Take 1,000 mg by mouth daily.   atorvastatin (LIPITOR) 10 MG tablet TAKE 1 TABLET BY MOUTH EVERY DAY/LABS FOR REFILLS (Patient taking differently: Take 10 mg by mouth daily.)   Budeson-Glycopyrrol-Formoterol (BREZTRI AEROSPHERE) 160-9-4.8 MCG/ACT AERO Inhale 2 puffs into the lungs in the morning and at bedtime. (Patient taking differently: Inhale 2 puffs into the lungs in the morning and at bedtime. As needed.)   ibuprofen (ADVIL) 600 MG tablet Take 1 tablet (600 mg total) by mouth  every 8 (eight) hours as needed. (Patient taking differently: Take 600 mg by mouth every 8 (eight) hours as needed for mild pain.)   Multiple Vitamins-Minerals (CENTRUM PO) Take 1 tablet by mouth daily.   Salicylic Acid 3 % SHAM Apply 1 application topically 3 (three) times a week.   Spacer/Aero-Holding Chambers DEVI USE WITH INHALER AS DIRECTED    No Known Allergies  Patient Active Problem List    Diagnosis Date Noted   Lung nodule 11/29/2020   Chest tube in place 11/29/2020   CAP (community acquired pneumonia) 11/29/2020   Pleural effusion 11/29/2020   Cachexia ([place]) 11/29/2020   Tobacco use 11/29/2020   Centrilobular emphysema ([place]) 11/29/2020   Empyema ([place]) 11/25/2020   Benign localized prostatic hyperplasia with lower urinary tract symptoms (LUTS) 08/25/2020   Skin lesion of face 10/29/2019   Lightheadedness 10/29/2019   Proteinuria 01/31/2018   Frequent urination 01/31/2018   Orthostasis 06/12/2017   Heavy tobacco smoker 06/12/2017   Benign colonic polyp 11/02/2016   Needs flu shot 07/08/2016   Osteopenia 02/17/2016   Essential hypertension 02/09/2016   Special screening for malignant neoplasms, colon 03/05/2015   Hyperlipidemia 12/16/2014   COPD with emphysema ([place]) 12/15/2014   SHOULDER PAIN 08/24/2010   HEMATURIA UNSPECIFIED 06/11/2009   ERECTILE DYSFUNCTION, ORGANIC 06/11/2009   SEBORRHEIC KERATOSIS 06/11/2009   LUMBAGO 06/11/2009    Family History  Problem Relation Age of Onset   Cancer Father        mouth cancer     Social History   Tobacco Use  Smoking Status Former   Packs/day: 0.50   Types: Cigarettes   Quit date: 10/24/2020   Years since quitting: 0.7  Smokeless Tobacco Never    Past Surgical History:  Procedure Laterality Date   COLONOSCOPY     INNER EAR SURGERY     hole in eardrum 30 years ago    Immunization History  Administered Date(s) Administered   Fluad Quad(high Dose 65+) 07/03/2019, 07/13/2020   Influenza, High Dose Seasonal PF 06/12/2017, 07/19/2018   Influenza,inj,Quad PF,6+ Mos 07/08/2016   Influenza-Unspecified 07/24/2014, 07/22/2015   PFIZER(Purple Top)SARS-COV-2 Vaccination 11/08/2019, 11/29/2019, 09/21/2020   Pneumococcal Polysaccharide-23 10/24/2005   Pneumococcal-Unspecified 08/24/2014   Td 10/24/2005   Tdap 10/28/2015   Zoster, Live 02/07/2014    No results found for this or any previous visit (from the  past 2160 hour(s)).  No results found.     All questions at time of visit were answered - patient instructed to contact office with any additional concerns or updates. ER/RTC precautions were reviewed with the patient as applicable.   Please note: manual typing as well as voice recognition software may have been used to produce this document - typos may escape review. Please contact Dr. Sheppard Coil for any needed clarifications.

## 2021-07-13 ENCOUNTER — Ambulatory Visit (INDEPENDENT_AMBULATORY_CARE_PROVIDER_SITE_OTHER): Payer: Medicare Other | Admitting: Osteopathic Medicine

## 2021-07-13 DIAGNOSIS — Z23 Encounter for immunization: Secondary | ICD-10-CM

## 2021-07-13 NOTE — Telephone Encounter (Signed)
Hello Dr. Valeta Harms, please see mychart message below. Thanks!  Dear Dr. Valeta Harms, can you review my dad's chest x-ray from 9/19 when you get a chance. It looks like there is a 1.2 cm nodularity and radiology is requesting a chest CT..I was wondering if you could order the CT so the results can come to you. You are not due to see dad but I can bring him in after the CT if you have appointment availability. If so, any possibility he could have the CT at [place] or [place] [facility]? Those are two locations that are easy for him to get to. Thank you, Lelon Frohlich (daughter)

## 2021-07-15 NOTE — Telephone Encounter (Signed)
Garner Nash, DO to [place], [place] [facility]  [facility] Triage Pool     8:28 AM  I agree. I have reviewed the images. Please order Super D CT Chest WO Contrast.   Please schedule an appt with me. 78min follow slot to see review CT images once completed.   Thanks   BLI   Super D ordered

## 2021-07-16 ENCOUNTER — Other Ambulatory Visit: Payer: Self-pay

## 2021-07-16 ENCOUNTER — Ambulatory Visit (INDEPENDENT_AMBULATORY_CARE_PROVIDER_SITE_OTHER): Payer: Medicare Other

## 2021-07-16 DIAGNOSIS — J479 Bronchiectasis, uncomplicated: Secondary | ICD-10-CM | POA: Diagnosis not present

## 2021-07-16 DIAGNOSIS — I700 Atherosclerosis of aorta: Secondary | ICD-10-CM | POA: Diagnosis not present

## 2021-07-16 DIAGNOSIS — R911 Solitary pulmonary nodule: Secondary | ICD-10-CM | POA: Diagnosis not present

## 2021-07-16 DIAGNOSIS — J439 Emphysema, unspecified: Secondary | ICD-10-CM | POA: Diagnosis not present

## 2021-07-21 NOTE — Progress Notes (Signed)
Let him know that his ct is stable. The area we were following from previous looks better.   Thanks,  BLI  Garner Nash, DO [facility] 07/21/2021 4:07 PM

## 2021-07-22 ENCOUNTER — Encounter: Payer: Self-pay | Admitting: *Deleted

## 2021-07-29 ENCOUNTER — Encounter: Payer: Self-pay | Admitting: Family Medicine

## 2021-07-29 ENCOUNTER — Ambulatory Visit (INDEPENDENT_AMBULATORY_CARE_PROVIDER_SITE_OTHER): Payer: Medicare Other | Admitting: Family Medicine

## 2021-07-29 VITALS — BP 191/80 | HR 65 | Temp 97.4°F | Wt 135.0 lb

## 2021-07-29 DIAGNOSIS — R030 Elevated blood-pressure reading, without diagnosis of hypertension: Secondary | ICD-10-CM

## 2021-07-29 DIAGNOSIS — H6123 Impacted cerumen, bilateral: Secondary | ICD-10-CM

## 2021-07-29 NOTE — Patient Instructions (Signed)
Ears cleaned today.  Please keep a log of home blood pressure readings over the next week and bring to nurse visit with your home cuff.

## 2021-07-29 NOTE — Progress Notes (Signed)
Acute Office Visit  Subjective:    Patient ID: Timothy Waller, male    DOB: Aug 09, 1934, 87 y.o.   MRN: [id number]  Chief Complaint  Patient presents with   Cerumen Impaction    HPI Patient is in today for cerumen impaction.  Reports he has had chronic issues with cerumen impaction in both ears, left typically worse than right. States in the past he has tried cleaning them out at home, but that always just makes it worse. Now he tends to just leave the ears alone and let the wax build up until it starts bothering him, then he comes to have them cleaned. Reports for the past week or so he has noticed fullness bilaterally and some muffled hearing. He denies any ear pain, fevers, URI symptoms.     Past Medical History:  Diagnosis Date   Abnormal weight loss    COPD (chronic obstructive pulmonary disease) (HCC)    Hyperlipidemia    Osteoporosis 09/01/2010   Qualifier: Diagnosis of  By: McCrimmon CMA, (AAMA), Seth Bake      Past Surgical History:  Procedure Laterality Date   COLONOSCOPY     INNER EAR SURGERY     hole in eardrum 30 years ago    Family History  Problem Relation Age of Onset   Cancer Father        mouth cancer     Social History   Socioeconomic History   Marital status: Married    Spouse name: Anne Ng   Number of children: 4   Years of education: 12   Highest education level: 12th grade  Occupational History    Comment: Retired.  Tobacco Use   Smoking status: Former    Packs/day: 0.50    Types: Cigarettes    Quit date: 10/24/2020    Years since quitting: 0.7   Smokeless tobacco: Never  Vaping Use   Vaping Use: Never used  Substance and Sexual Activity   Alcohol use: Yes    Alcohol/week: 0.0 standard drinks    Comment: 2-3 drinks per week    Drug use: No   Sexual activity: Not Currently  Other Topics Concern   Not on file  Social History Narrative   Lives with his wife. He has four children. He likes to watch tv and do a little yard work in his  free time.   Social Determinants of Health   Financial Resource Strain: Low Risk    Difficulty of Paying Living Expenses: Not hard at all  Food Insecurity: No Food Insecurity   Worried About Charity fundraiser in the Last Year: Never true   [place] in the Last Year: Never true  Transportation Needs: No Transportation Needs   Lack of Transportation (Medical): No   Lack of Transportation (Non-Medical): No  Physical Activity: Inactive   Days of Exercise per Week: 0 days   Minutes of Exercise per Session: 0 min  Stress: No Stress Concern Present   Feeling of Stress : Not at all  Social Connections: Moderately Integrated   Frequency of Communication with Friends and Family: Twice a week   Frequency of Social Gatherings with Friends and Family: Twice a week   Attends Religious Services: 1 to 4 times per year   Active Member of [employer] or Organizations: No   Attends Archivist Meetings: Never   Marital Status: Married  Human resources officer Violence: Not At Risk   Fear of Current or Ex-Partner: No   Emotionally  Abused: No   Physically Abused: No   Sexually Abused: No    Outpatient Medications Prior to Visit  Medication Sig Dispense Refill   albuterol (VENTOLIN HFA) 108 (90 Base) MCG/ACT inhaler Inhale 2 puffs into the lungs every 6 (six) hours as needed for wheezing. 2 each 11   Ascorbic Acid (VITAMIN C) 1000 MG tablet Take 1,000 mg by mouth daily.     atorvastatin (LIPITOR) 10 MG tablet TAKE 1 TABLET BY MOUTH EVERY DAY/LABS FOR REFILLS (Patient taking differently: Take 10 mg by mouth daily.) 90 tablet 2   Budeson-Glycopyrrol-Formoterol (BREZTRI AEROSPHERE) 160-9-4.8 MCG/ACT AERO Inhale 2 puffs into the lungs in the morning and at bedtime. (Patient taking differently: Inhale 2 puffs into the lungs in the morning and at bedtime. As needed.) 19.2 g 6   ibuprofen (ADVIL) 600 MG tablet Take 1 tablet (600 mg total) by mouth every 8 (eight) hours as needed. (Patient taking  differently: Take 600 mg by mouth every 8 (eight) hours as needed for mild pain.) 30 tablet 0   Multiple Vitamins-Minerals (CENTRUM PO) Take 1 tablet by mouth daily.     Salicylic Acid 3 % SHAM Apply 1 application topically 3 (three) times a week. 236 mL 5   Spacer/Aero-Holding Chambers DEVI USE WITH INHALER AS DIRECTED 1 each 0   No facility-administered medications prior to visit.    No Known Allergies  Review of Systems All review of systems negative except what is listed in the HPI     Objective:    Physical Exam Vitals reviewed.  Constitutional:      Appearance: Normal appearance.  HENT:     Head: Normocephalic and atraumatic.     Right Ear: There is impacted cerumen.     Left Ear: There is impacted cerumen.  Skin:    General: Skin is warm and dry.  Neurological:     Mental Status: He is alert and oriented to person, place, and time.  Psychiatric:        Mood and Affect: Mood normal.        Thought Content: Thought content normal.    BP (!) 179/79 (BP Location: Left Arm, Patient Position: Sitting, Cuff Size: Normal)   Pulse 65   Temp (!) 97.4 F (36.3 C) (Oral)   Wt 135 lb (61.2 kg)   BMI 20.53 kg/m  Wt Readings from Last 3 Encounters:  07/29/21 135 lb (61.2 kg)  07/12/21 134 lb 2 oz (60.8 kg)  03/10/21 132 lb 1.9 oz (59.9 kg)    There are no preventive care reminders to display for this patient.  There are no preventive care reminders to display for this patient.   Lab Results  Component Value Date   TSH 1.68 11/19/2020   Lab Results  Component Value Date   WBC 5.9 12/08/2020   HGB 9.0 (L) 12/08/2020   HCT 28.1 (L) 12/08/2020   MCV 85.7 12/08/2020   PLT 527 (H) 12/08/2020   Lab Results  Component Value Date   NA 138 12/08/2020   K 4.7 12/08/2020   CO2 29 12/08/2020   GLUCOSE 72 12/08/2020   BUN 25 12/08/2020   CREATININE 0.97 12/08/2020   BILITOT 0.2 12/08/2020   ALKPHOS 59 11/02/2016   AST 71 (H) 12/08/2020   ALT 86 (H) 12/08/2020    PROT 6.7 12/08/2020   ALBUMIN 2.2 (L) 11/26/2020   CALCIUM 8.8 12/08/2020   ANIONGAP 9 11/28/2020   Lab Results  Component Value Date   CHOL  178 11/06/2018   Lab Results  Component Value Date   HDL 86 11/06/2018   Lab Results  Component Value Date   LDLCALC 77 11/06/2018   Lab Results  Component Value Date   TRIG 71 11/06/2018   Lab Results  Component Value Date   CHOLHDL 2.1 11/06/2018   Lab Results  Component Value Date   HGBA1C 5.8 (H) 11/06/2018       Assessment & Plan:    1. Impacted cerumen of both ears - Ear Lavage  Indication: Cerumen impaction of the ears Medical necessity statement: On physical examination, cerumen impairs clinically significant portions of the external auditory canal, and tympanic membrane. Noted obstructive, copious cerumen that cannot be removed without magnification and instrumentations requiring physician skills Consent: Discussed benefits and risks of procedure and verbal consent obtained Procedure: Patient was prepped for the procedure. Utilized an otoscope to assess and take note of the ear canal, the tympanic membrane, and the presence, amount, and placement of the cerumen. Gentle water irrigation was utilized to remove cerumen.  Post procedure examination: shows cerumen was completely removed. Patient tolerated procedure well. The patient is made aware that they may experience temporary vertigo, temporary hearing loss, and temporary discomfort. If these symptom last for more than 24 hours to call the clinic or proceed to the ED.    2. Elevated BP without diagnosis of hypertension BP elevated x2 in office today. Patient denies any history of HTN. Asymptomatic - no headaches, vision changes, chest pain, edema, etc. Discussed low sodium, heart healthy diet and how to properly check BP at home. Would like him to keep a log of readings and come back within the next week for a nurse visit to reassess.    Patient aware of signs/symptoms  requiring further/urgent evaluation.  Follow-up as needed.   Purcell Nails Olevia Bowens, DNP, FNP-C

## 2021-08-05 ENCOUNTER — Ambulatory Visit (INDEPENDENT_AMBULATORY_CARE_PROVIDER_SITE_OTHER): Payer: Medicare Other | Admitting: Family Medicine

## 2021-08-05 ENCOUNTER — Other Ambulatory Visit: Payer: Self-pay | Admitting: Osteopathic Medicine

## 2021-08-05 VITALS — BP 156/76 | HR 59 | Temp 98.0°F | Wt 135.0 lb

## 2021-08-05 DIAGNOSIS — I10 Essential (primary) hypertension: Secondary | ICD-10-CM

## 2021-08-05 NOTE — Progress Notes (Signed)
Patient was here for a NV for a blood pressure check. Denies chest pains, lightheadedness, palpitations, dizziness, SOB or medication problems. Patient did not bring home monitor as requested. Blood pressure was not at goal. Patient has been informed to schedule an appointment for BP recheck in 3 - 4 weeks with Dr. Zigmund Daniel.   Home readings:  07/30/21 148/74 08/01/21 127/63 (79) 08/03/21 135/66 08/05/21 128/58 (69)

## 2021-08-05 NOTE — Progress Notes (Signed)
Medical screening examination/treatment was performed by qualified clinical staff member and as supervising physician I was immediately available for consultation/collaboration. I have reviewed documentation and agree with assessment and plan.  Elis Rawlinson, DO  

## 2021-08-12 ENCOUNTER — Encounter: Payer: Self-pay | Admitting: Family Medicine

## 2021-08-13 MED ORDER — AMLODIPINE BESYLATE 5 MG PO TABS: 5.0000 mg | ORAL_TABLET | Freq: Every day | ORAL | 0 refills | Status: DC

## 2021-08-13 NOTE — Telephone Encounter (Signed)
I called and scheduled patient.  

## 2021-08-17 ENCOUNTER — Ambulatory Visit (INDEPENDENT_AMBULATORY_CARE_PROVIDER_SITE_OTHER): Payer: Medicare Other | Admitting: Family Medicine

## 2021-08-17 ENCOUNTER — Other Ambulatory Visit: Payer: Self-pay

## 2021-08-17 ENCOUNTER — Encounter: Payer: Self-pay | Admitting: Family Medicine

## 2021-08-17 VITALS — BP 176/91 | HR 60 | Temp 97.9°F | Wt 136.0 lb

## 2021-08-17 DIAGNOSIS — I10 Essential (primary) hypertension: Secondary | ICD-10-CM

## 2021-08-17 NOTE — Patient Instructions (Signed)
          Blood pressure is not at goal at for age and co-morbidities.  I recommend continuing amlodipine 5 mg daily - he has only been taking for 2-3 days so far.  In addition they were instructed on the following: - BP goal <140/90 - monitor and log blood pressures at home - check around the same time each day in a relaxed setting - Limit salt to <2000 mg/day - Follow DASH eating plan (heart healthy diet) - limit alcohol to 2 standard drinks per day for men and 1 per day for women - avoid tobacco products - get at least 2 hours of regular aerobic exercise weekly Patient aware of signs/symptoms requiring further/urgent evaluation.

## 2021-08-17 NOTE — Progress Notes (Signed)
Established Patient Office Visit  Subjective:  Patient ID: Timothy Waller, male    DOB: 1934/05/23  Age: 87 y.o. MRN: [id number]  CC:  Chief Complaint  Patient presents with   Hypertension    HPI Timothy Waller presents for blood pressure management. His daughter sent a patient portal message on 08/12/21 with a list of elevated BP readings ([phone]) stating he was asymptomatic, but under a lot of stress taking care of his wife recently. Dr. Zigmund Daniel started amlodipine 5 mg via MyChart communication and instructed patient to follow-up for in-office visit.   HYPERTENSION: - Medications: amlodipine 5mg  daily (started 3 days) - Compliance: good - Checking BP at home: yes, 135/78 yesterday - Denies any SOB, recurrent headaches, CP, vision changes, LE edema, dizziness, palpitations, or medication side effects. - Diet: average, no restrictions - Exercise: minimal - Stressors: caregiver for wife with dementia   Past Medical History:  Diagnosis Date   Abnormal weight loss    COPD (chronic obstructive pulmonary disease) (HCC)    Hyperlipidemia    Osteoporosis 09/01/2010   Qualifier: Diagnosis of  By: McCrimmon CMA, (AAMA), Seth Bake      Past Surgical History:  Procedure Laterality Date   COLONOSCOPY     INNER EAR SURGERY     hole in eardrum 30 years ago    Family History  Problem Relation Age of Onset   Cancer Father        mouth cancer     Social History   Socioeconomic History   Marital status: Married    Spouse name: Anne Ng   Number of children: 4   Years of education: 12   Highest education level: 12th grade  Occupational History    Comment: Retired.  Tobacco Use   Smoking status: Former    Packs/day: 0.50    Types: Cigarettes    Quit date: 10/24/2020    Years since quitting: 0.8   Smokeless tobacco: Never  Vaping Use   Vaping Use: Never used  Substance and Sexual Activity   Alcohol use: Yes    Alcohol/week: 0.0 standard drinks    Comment: 2-3  drinks per week    Drug use: No   Sexual activity: Not Currently  Other Topics Concern   Not on file  Social History Narrative   Lives with his wife. He has four children. He likes to watch tv and do a little yard work in his free time.   Social Determinants of Health   Financial Resource Strain: Low Risk    Difficulty of Paying Living Expenses: Not hard at all  Food Insecurity: No Food Insecurity   Worried About Charity fundraiser in the Last Year: Never true   [place] in the Last Year: Never true  Transportation Needs: No Transportation Needs   Lack of Transportation (Medical): No   Lack of Transportation (Non-Medical): No  Physical Activity: Inactive   Days of Exercise per Week: 0 days   Minutes of Exercise per Session: 0 min  Stress: No Stress Concern Present   Feeling of Stress : Not at all  Social Connections: Moderately Integrated   Frequency of Communication with Friends and Family: Twice a week   Frequency of Social Gatherings with Friends and Family: Twice a week   Attends Religious Services: 1 to 4 times per year   Active Member of [employer] or Organizations: No   Attends Archivist Meetings: Never   Marital Status: Married  Human resources officer  Violence: Not At Risk   Fear of Current or Ex-Partner: No   Emotionally Abused: No   Physically Abused: No   Sexually Abused: No    Outpatient Medications Prior to Visit  Medication Sig Dispense Refill   albuterol (VENTOLIN HFA) 108 (90 Base) MCG/ACT inhaler Inhale 2 puffs into the lungs every 6 (six) hours as needed for wheezing. 2 each 11   amLODipine (NORVASC) 5 MG tablet Take 1 tablet (5 mg total) by mouth daily. 30 tablet 0   Ascorbic Acid (VITAMIN C) 1000 MG tablet Take 1,000 mg by mouth daily.     atorvastatin (LIPITOR) 10 MG tablet TAKE 1 TABLET BY MOUTH EVERY DAY/LABS FOR REFILLS 90 tablet 2   Budeson-Glycopyrrol-Formoterol (BREZTRI AEROSPHERE) 160-9-4.8 MCG/ACT AERO Inhale 2 puffs into the lungs in  the morning and at bedtime. (Patient taking differently: Inhale 2 puffs into the lungs in the morning and at bedtime. As needed.) 19.2 g 6   ibuprofen (ADVIL) 600 MG tablet Take 1 tablet (600 mg total) by mouth every 8 (eight) hours as needed. (Patient taking differently: Take 600 mg by mouth every 8 (eight) hours as needed for mild pain.) 30 tablet 0   Multiple Vitamins-Minerals (CENTRUM PO) Take 1 tablet by mouth daily.     Salicylic Acid 3 % SHAM Apply 1 application topically 3 (three) times a week. 236 mL 5   Spacer/Aero-Holding Chambers DEVI USE WITH INHALER AS DIRECTED 1 each 0   No facility-administered medications prior to visit.    No Known Allergies  ROS Review of Systems All review of systems negative except what is listed in the HPI    Objective:    Physical Exam Vitals reviewed.  Constitutional:      Appearance: Normal appearance. He is normal weight.  HENT:     Head: Normocephalic and atraumatic.  Cardiovascular:     Rate and Rhythm: Normal rate and regular rhythm.     Pulses: Normal pulses.  Pulmonary:     Effort: Pulmonary effort is normal.     Breath sounds: Normal breath sounds.  Musculoskeletal:     Right lower leg: No edema.     Left lower leg: No edema.  Skin:    General: Skin is warm and dry.  Neurological:     General: No focal deficit present.     Mental Status: He is alert and oriented to person, place, and time. Mental status is at baseline.  Psychiatric:        Behavior: Behavior normal.        Thought Content: Thought content normal.        Judgment: Judgment normal.       BP (!) 178/80 (BP Location: Left Arm, Patient Position: Sitting, Cuff Size: Small)   Pulse 61   Temp 97.9 F (36.6 C) (Oral)   Wt 136 lb 0.6 oz (61.7 kg)   BMI 20.68 kg/m  Wt Readings from Last 3 Encounters:  08/17/21 136 lb 0.6 oz (61.7 kg)  08/05/21 135 lb 0.6 oz (61.3 kg)  07/29/21 135 lb (61.2 kg)     Health Maintenance Due  Topic Date Due   Pneumonia  Vaccine 84+ Years old (2 - PCV) 08/25/2015   COVID-19 Vaccine (4 - Booster for Pfizer series) 11/16/2020    There are no preventive care reminders to display for this patient.  Lab Results  Component Value Date   TSH 1.68 11/19/2020   Lab Results  Component Value Date   WBC 5.9  12/08/2020   HGB 9.0 (L) 12/08/2020   HCT 28.1 (L) 12/08/2020   MCV 85.7 12/08/2020   PLT 527 (H) 12/08/2020   Lab Results  Component Value Date   NA 138 12/08/2020   K 4.7 12/08/2020   CO2 29 12/08/2020   GLUCOSE 72 12/08/2020   BUN 25 12/08/2020   CREATININE 0.97 12/08/2020   BILITOT 0.2 12/08/2020   ALKPHOS 59 11/02/2016   AST 71 (H) 12/08/2020   ALT 86 (H) 12/08/2020   PROT 6.7 12/08/2020   ALBUMIN 2.2 (L) 11/26/2020   CALCIUM 8.8 12/08/2020   ANIONGAP 9 11/28/2020   Lab Results  Component Value Date   CHOL 178 11/06/2018   Lab Results  Component Value Date   HDL 86 11/06/2018   Lab Results  Component Value Date   LDLCALC 77 11/06/2018   Lab Results  Component Value Date   TRIG 71 11/06/2018   Lab Results  Component Value Date   CHOLHDL 2.1 11/06/2018   Lab Results  Component Value Date   HGBA1C 5.8 (H) 11/06/2018      Assessment & Plan:   Problem List Items Addressed This Visit       Cardiovascular and Mediastinum   Essential hypertension - Primary (Chronic)    Blood pressure is not at goal at for age and co-morbidities.  I recommend continuing amlodipine 5 mg daily - he has only been taking for 2-3 days so far.  In addition they were instructed on the following: - BP goal <140/90 - monitor and log blood pressures at home - check around the same time each day in a relaxed setting - Limit salt to <2000 mg/day - Follow DASH eating plan (heart healthy diet) - limit alcohol to 2 standard drinks per day for men and 1 per day for women - avoid tobacco products - get at least 2 hours of regular aerobic exercise weekly Patient aware of signs/symptoms requiring  further/urgent evaluation.        Relevant Orders   COMPLETE METABOLIC PANEL WITH GFR  -Patient declined CMP today - states he is coming back soon for physical and prefers to wait. Offered to check all CPE labs today in preparation for physical but he declined.  No orders of the defined types were placed in this encounter.  Please contact office for sooner follow-up if symptoms do not improve or worsen. Seek emergency care if symptoms become severe.   Follow-up: Return in about 2 weeks (around 08/31/2021) for nurse visit BP check, compare to home machine; est with Dr. Zigmund Daniel in 4-6 weeks.    Terrilyn Saver, NP

## 2021-08-17 NOTE — Assessment & Plan Note (Signed)
Blood pressure is not at goal at for age and co-morbidities.  I recommend continuing amlodipine 5 mg daily - he has only been taking for 2-3 days so far.  In addition they were instructed on the following: - BP goal <140/90 - monitor and log blood pressures at home - check around the same time each day in a relaxed setting - Limit salt to <2000 mg/day - Follow DASH eating plan (heart healthy diet) - limit alcohol to 2 standard drinks per day for men and 1 per day for women - avoid tobacco products - get at least 2 hours of regular aerobic exercise weekly Patient aware of signs/symptoms requiring further/urgent evaluation.

## 2021-09-10 ENCOUNTER — Other Ambulatory Visit: Payer: Self-pay

## 2021-09-10 ENCOUNTER — Encounter: Payer: Self-pay | Admitting: Family Medicine

## 2021-09-10 ENCOUNTER — Ambulatory Visit (INDEPENDENT_AMBULATORY_CARE_PROVIDER_SITE_OTHER): Payer: Medicare Other | Admitting: Family Medicine

## 2021-09-10 DIAGNOSIS — I10 Essential (primary) hypertension: Secondary | ICD-10-CM

## 2021-09-10 DIAGNOSIS — E7849 Other hyperlipidemia: Secondary | ICD-10-CM | POA: Diagnosis not present

## 2021-09-10 DIAGNOSIS — J439 Emphysema, unspecified: Secondary | ICD-10-CM

## 2021-09-10 MED ORDER — AMLODIPINE BESYLATE 10 MG PO TABS: 10.0000 mg | ORAL_TABLET | Freq: Every day | ORAL | 3 refills | Status: DC

## 2021-09-10 NOTE — Patient Instructions (Signed)
Increase amlodipine to 10mg  daily. Return for nurse visit in 4 weeks.  See me again in 6 months.

## 2021-09-12 NOTE — Progress Notes (Signed)
Timothy Waller - 87 y.o. male MRN [id number]  Date of birth: Feb 13, 1934  Subjective Chief Complaint  Patient presents with   Follow-up    HPI Timothy Waller is a an 87 year old male here today for follow-up visit.  He is a former patient of Dr. Sheppard Coil.  He does have history of hypertension, COPD, hyperlipidemia and BPH.  He reports overall he is doing well at this time.  He does see pulmonology for his COPD.  His breathing is stable with current inhalers.  Blood pressure is elevated today.  Readings at home have been elevated as well.  He is tolerating amlodipine well so far.  He has not had any chest pain, headaches or new vision changes.  He does also tolerate atorvastatin well.  ROS:  A comprehensive ROS was completed and negative except as noted per HPI  No Known Allergies  Past Medical History:  Diagnosis Date   Abnormal weight loss    COPD (chronic obstructive pulmonary disease) (HCC)    Hyperlipidemia    Osteoporosis 09/01/2010   Qualifier: Diagnosis of  By: McCrimmon CMA, (AAMA), Seth Bake      Past Surgical History:  Procedure Laterality Date   COLONOSCOPY     INNER EAR SURGERY     hole in eardrum 30 years ago    Social History   Socioeconomic History   Marital status: Married    Spouse name: Anne Ng   Number of children: 4   Years of education: 12   Highest education level: 12th grade  Occupational History    Comment: Retired.  Tobacco Use   Smoking status: Former    Packs/day: 0.50    Types: Cigarettes    Quit date: 10/24/2020    Years since quitting: 0.8   Smokeless tobacco: Never  Vaping Use   Vaping Use: Never used  Substance and Sexual Activity   Alcohol use: Yes    Alcohol/week: 0.0 standard drinks    Comment: 2-3 drinks per week    Drug use: No   Sexual activity: Not Currently  Other Topics Concern   Not on file  Social History Narrative   Lives with his wife. He has four children. He likes to watch tv and do a little yard work in his free time.    Social Determinants of Health   Financial Resource Strain: Low Risk    Difficulty of Paying Living Expenses: Not hard at all  Food Insecurity: No Food Insecurity   Worried About Charity fundraiser in the Last Year: Never true   [place] in the Last Year: Never true  Transportation Needs: No Transportation Needs   Lack of Transportation (Medical): No   Lack of Transportation (Non-Medical): No  Physical Activity: Inactive   Days of Exercise per Week: 0 days   Minutes of Exercise per Session: 0 min  Stress: No Stress Concern Present   Feeling of Stress : Not at all  Social Connections: Moderately Integrated   Frequency of Communication with Friends and Family: Twice a week   Frequency of Social Gatherings with Friends and Family: Twice a week   Attends Religious Services: 1 to 4 times per year   Active Member of [employer] or Organizations: No   Attends Archivist Meetings: Never   Marital Status: Married    Family History  Problem Relation Age of Onset   Cancer Father        mouth cancer     Health Maintenance  Topic Date  Due   COVID-19 Vaccine (4 - Booster for Pfizer series) 11/16/2020   Zoster Vaccines- Shingrix (1 of 2) 10/11/2021 (Originally [birth year]/1955)   Pneumonia Vaccine 2+ Years old (2 - PCV) 08/17/2022 (Originally 08/25/2015)   TETANUS/TDAP  10/27/2025   INFLUENZA VACCINE  Completed   HPV VACCINES  Aged Out     ----------------------------------------------------------------------------------------------------------------------------------------------------------------------------------------------------------------- Physical Exam BP (!) 149/73 (BP Location: Left Arm, Patient Position: Sitting, Cuff Size: Small)   Pulse 63   Temp 97.7 F (36.5 C)   Ht 5\' 8"  (1.727 m)   Wt 137 lb (62.1 kg)   SpO2 97%   BMI 20.83 kg/m   Physical Exam Constitutional:      Appearance: Normal appearance.  Eyes:     General: No scleral  icterus. Cardiovascular:     Rate and Rhythm: Normal rate and regular rhythm.  Pulmonary:     Effort: Pulmonary effort is normal.     Breath sounds: Normal breath sounds.  Musculoskeletal:     Cervical back: Neck supple.  Neurological:     General: No focal deficit present.     Mental Status: He is alert.  Psychiatric:        Mood and Affect: Mood normal.        Behavior: Behavior normal.    ------------------------------------------------------------------------------------------------------------------------------------------------------------------------------------------------------------------- Assessment and Plan  COPD with emphysema Stable with current inhalers.  He will continue to see pulmonology for management.  Encouraged to remain quit from smoking..  Essential hypertension Blood pressure elevated today.  Increase amlodipine to 10 mg daily.  He will continue to monitor blood pressures at home.Return in about 6 months (around 03/10/2022) for HTN.   Hyperlipidemia He is doing well with atorvastatin for management of hyperlipidemia.  Continue current strength.   Meds ordered this encounter  Medications   amLODipine (NORVASC) 10 MG tablet    Sig: Take 1 tablet (10 mg total) by mouth daily.    Dispense:  90 tablet    Refill:  3    Return in about 6 months (around 03/10/2022) for HTN.    This visit occurred during the SARS-CoV-2 public health emergency.  Safety protocols were in place, including screening questions prior to the visit, additional usage of staff PPE, and extensive cleaning of exam room while observing appropriate contact time as indicated for disinfecting solutions.

## 2021-09-12 NOTE — Assessment & Plan Note (Signed)
Blood pressure elevated today.  Increase amlodipine to 10 mg daily.  He will continue to monitor blood pressures at home.Return in about 6 months (around 03/10/2022) for HTN.

## 2021-09-12 NOTE — Assessment & Plan Note (Addendum)
Stable with current inhalers.  He will continue to see pulmonology for management.  Encouraged to remain quit from smoking.Marland Kitchen

## 2021-09-12 NOTE — Assessment & Plan Note (Signed)
He is doing well with atorvastatin for management of hyperlipidemia.  Continue current strength.

## 2021-11-02 ENCOUNTER — Telehealth (INDEPENDENT_AMBULATORY_CARE_PROVIDER_SITE_OTHER): Payer: Medicare Other | Admitting: Family Medicine

## 2021-11-02 ENCOUNTER — Encounter: Payer: Self-pay | Admitting: Family Medicine

## 2021-11-02 DIAGNOSIS — J069 Acute upper respiratory infection, unspecified: Secondary | ICD-10-CM | POA: Diagnosis not present

## 2021-11-02 NOTE — Patient Instructions (Signed)

## 2021-11-02 NOTE — Progress Notes (Signed)
Virtual Visit via Telephone Note  I connected with  Timothy Waller on 11/02/21 at 10:30 AM EST by telephone and verified that I am speaking with the correct person using two identifiers.   I discussed the limitations, risks, security and privacy concerns of performing an evaluation and management service by telephone and the availability of in person appointments. I also discussed with the patient that there may be a patient responsible charge related to this service. The patient expressed understanding and agreed to proceed.  Participating parties included in this telephone visit include: The patient and the nurse practitioner listed.  The patient is: At home I am: In the office  Subjective:    CC: URI symptoms   HPI: Timothy Waller is a 88 y.o. year old male presenting today via telephone visit to discuss URI symptoms.  Patient states that about 4-5 days ago he started with URI symptoms including sinus pressure, headache, nasal congestion with yellow mucus, and occasional cough. States the only OTC medicine he has tried so far was Afrin once, but didn't find it helpful. He has not had a fever, chest pain, dyspnea, wheezing, GI/GU symptoms. He has not tested for COVID. No known sick contacts.     Past medical history, Surgical history, Family history not pertinant except as noted below, Social history, Allergies, and medications have been entered into the medical record, reviewed, and corrections made.   Review of Systems:  All review of systems negative except what is listed in the HPI  Objective:    General:  Patient speaking clearly in complete sentences. No shortness of breath noted.   Alert and oriented x3.   Normal judgment.  No apparent acute distress.  Impression and Recommendations:    1. Viral URI with cough Only 5 days of URI symptoms with occasional cough and no fevers. Recommend a few more days of supportive measures as well as taking a home COVID test. Continue  supportive measures including rest, hydration, humidifier use, steam showers, warm compresses to sinuses, warm liquids with lemon and honey, and over-the-counter cough, cold, and analgesics as needed.  List added to AVS with OTC medications to try and COVID guidelines. Patient aware of signs/symptoms requiring further/urgent evaluation.   Follow-up if no improvement before the weekend or sooner if needed.      I discussed the assessment and treatment plan with the patient. The patient was provided an opportunity to ask questions and all were answered. The patient agreed with the plan and demonstrated an understanding of the instructions.   The patient was advised to call back or seek an in-person evaluation if the symptoms worsen or if the condition fails to improve as anticipated.  I provided 20 minutes of non-face-to-face time during this TELEPHONE encounter.    Terrilyn Saver, NP

## 2021-11-16 ENCOUNTER — Encounter: Payer: Self-pay | Admitting: Family Medicine

## 2021-11-16 ENCOUNTER — Ambulatory Visit (INDEPENDENT_AMBULATORY_CARE_PROVIDER_SITE_OTHER): Payer: Medicare Other | Admitting: Family Medicine

## 2021-11-16 ENCOUNTER — Other Ambulatory Visit: Payer: Self-pay

## 2021-11-16 DIAGNOSIS — R0981 Nasal congestion: Secondary | ICD-10-CM | POA: Insufficient documentation

## 2021-11-16 MED ORDER — PREDNISONE 50 MG PO TABS: ORAL_TABLET | ORAL | 0 refills | Status: DC

## 2021-11-16 MED ORDER — FLUTICASONE PROPIONATE 50 MCG/ACT NA SUSP: 2.0000 | Freq: Every day | NASAL | 6 refills | Status: DC

## 2021-11-16 NOTE — Assessment & Plan Note (Addendum)
Adding course of prednisone as well as Flonase daily.  We discussed that if this is not improving would recommend ENT evaluation.

## 2021-11-16 NOTE — Progress Notes (Addendum)
Timothy Waller - 88 y.o. male MRN [id number]  Date of birth: July 07, 1934  Subjective No chief complaint on file.   HPI Timothy Waller is an 88 year old male here today with complaint of nasal congestion.  He has had this for several weeks.  Reports it is affecting his ability to sleep.  He denies any problems with runny nose, sinus pain, headaches, fever or chills.  He has tried Afrin without much relief.  He also tried Vicks without much relief.  ROS:  A comprehensive ROS was completed and negative except as noted per HPI  No Known Allergies  Past Medical History:  Diagnosis Date   Abnormal weight loss    COPD (chronic obstructive pulmonary disease) (HCC)    Hyperlipidemia    Osteoporosis 09/01/2010   Qualifier: Diagnosis of  By: McCrimmon CMA, (AAMA), Seth Bake      Past Surgical History:  Procedure Laterality Date   COLONOSCOPY     INNER EAR SURGERY     hole in eardrum 30 years ago    Social History   Socioeconomic History   Marital status: Married    Spouse name: Anne Ng   Number of children: 4   Years of education: 12   Highest education level: 12th grade  Occupational History    Comment: Retired.  Tobacco Use   Smoking status: Former    Packs/day: 0.50    Types: Cigarettes    Quit date: 10/24/2020    Years since quitting: 1.0   Smokeless tobacco: Never  Vaping Use   Vaping Use: Never used  Substance and Sexual Activity   Alcohol use: Yes    Alcohol/week: 0.0 standard drinks    Comment: 2-3 drinks per week    Drug use: No   Sexual activity: Not Currently  Other Topics Concern   Not on file  Social History Narrative   Lives with his wife. He has four children. He likes to watch tv and do a little yard work in his free time.   Social Determinants of Health   Financial Resource Strain: Low Risk    Difficulty of Paying Living Expenses: Not hard at all  Food Insecurity: No Food Insecurity   Worried About Charity fundraiser in the Last Year: Never true   [place] in the Last Year: Never true  Transportation Needs: No Transportation Needs   Lack of Transportation (Medical): No   Lack of Transportation (Non-Medical): No  Physical Activity: Inactive   Days of Exercise per Week: 0 days   Minutes of Exercise per Session: 0 min  Stress: No Stress Concern Present   Feeling of Stress : Not at all  Social Connections: Moderately Integrated   Frequency of Communication with Friends and Family: Twice a week   Frequency of Social Gatherings with Friends and Family: Twice a week   Attends Religious Services: 1 to 4 times per year   Active Member of [employer] or Organizations: No   Attends Archivist Meetings: Never   Marital Status: Married    Family History  Problem Relation Age of Onset   Cancer Father        mouth cancer     Health Maintenance  Topic Date Due   COVID-19 Vaccine (4 - Booster for [place] series) 11/18/2021 (Originally 11/16/2020)   Zoster Vaccines- Shingrix (1 of 2) 01/31/2022 (Originally 09/11/1955)   Pneumonia Vaccine 57+ Years old (2 - PCV) 08/17/2022 (Originally 08/25/2015)   TETANUS/TDAP  10/27/2025   INFLUENZA VACCINE  Completed   HPV VACCINES  Aged Out     ----------------------------------------------------------------------------------------------------------------------------------------------------------------------------------------------------------------- Physical Exam BP 139/64 (BP Location: Left Arm, Patient Position: Sitting, Cuff Size: Normal)    Pulse 66    Temp (!) 97.3 F (36.3 C)    Ht 5\' 8"  (1.727 m)    Wt 136 lb (61.7 kg)    SpO2 98%    BMI 20.68 kg/m   Physical Exam Constitutional:      Appearance: Normal appearance.  HENT:     Head: Normocephalic and atraumatic.     Nose: No congestion or rhinorrhea.  Eyes:     General: No scleral icterus. Neurological:     Mental Status: He is alert.  Psychiatric:        Mood and Affect: Mood normal.        Behavior: Behavior normal.     ------------------------------------------------------------------------------------------------------------------------------------------------------------------------------------------------------------------- Assessment and Plan  Nasal congestion Adding course of prednisone as well as Flonase daily.  We discussed that if this is not improving would recommend ENT evaluation.   Meds ordered this encounter  Medications   fluticasone (FLONASE) 50 MCG/ACT nasal spray    Sig: Place 2 sprays into both nostrils daily.    Dispense:  16 g    Refill:  6   predniSONE (DELTASONE) 50 MG tablet    Sig: Take 1 tab po daily x5 days    Dispense:  5 tablet    Refill:  0    No follow-ups on file.    This visit occurred during the SARS-CoV-2 public health emergency.  Safety protocols were in place, including screening questions prior to the visit, additional usage of staff PPE, and extensive cleaning of exam room while observing appropriate contact time as indicated for disinfecting solutions.

## 2021-11-26 ENCOUNTER — Emergency Department (INDEPENDENT_AMBULATORY_CARE_PROVIDER_SITE_OTHER)
Admission: EM | Admit: 2021-11-26 | Discharge: 2021-11-26 | Disposition: A | Discharge status: Home / Self Care | Payer: Medicare Other | Source: Home / Self Care | Attending: Family Medicine | Admitting: Family Medicine

## 2021-11-26 ENCOUNTER — Other Ambulatory Visit: Payer: Self-pay

## 2021-11-26 DIAGNOSIS — R0981 Nasal congestion: Secondary | ICD-10-CM | POA: Diagnosis not present

## 2021-11-26 MED ORDER — IPRATROPIUM BROMIDE 0.06 % NA SOLN: 2.0000 | Freq: Two times a day (BID) | NASAL | 1 refills | Status: DC

## 2021-11-26 NOTE — ED Triage Notes (Signed)
Pt states that he has some nasal congestion and stuffy nose. X2 weeks   Pt states that he has been seen recently by his pcp. Pt states that he was given a nasal spray and prednisone which hasn't helped.   Pt states that he is vaccinated for covid. Pt states that he has had flu vaccine.

## 2021-11-26 NOTE — ED Provider Notes (Signed)
[facility]    CSN: [id number] Arrival date & time: 11/26/21  0808      History   Chief Complaint Chief Complaint  Patient presents with   Nasal Congestion    Nasal congestion, and stuffy nose. X2 weeks    HPI Timothy Waller is a 88 y.o. male.   Patient complains of nasal congestion for about 3 to 4 week without facial pressure/pain, fever, earache and URI symptoms.  He was recently seen by his PCP and treated with Flonase and a course of prednisone without much improvement.  Patient also tried Allegra without improvement.  The history is provided by the patient.   Past Medical History:  Diagnosis Date   Abnormal weight loss    COPD (chronic obstructive pulmonary disease) ([place])    Hyperlipidemia    Osteoporosis 09/01/2010   Qualifier: Diagnosis of  By: Renae Fickle CMA, Deborra Medina), Andrea      Patient Active Problem List   Diagnosis Date Noted   Nasal congestion 11/16/2021   Lung nodule 11/29/2020   Chest tube in place 11/29/2020   CAP (community acquired pneumonia) 11/29/2020   Pleural effusion 11/29/2020   Cachexia ([place]) 11/29/2020   Tobacco use 11/29/2020   Centrilobular emphysema ([place]) 11/29/2020   Empyema ([place]) 11/25/2020   Benign localized prostatic hyperplasia with lower urinary tract symptoms (LUTS) 08/25/2020   Skin lesion of face 10/29/2019   Lightheadedness 10/29/2019   Proteinuria 01/31/2018   Frequent urination 01/31/2018   Orthostasis 06/12/2017   Heavy tobacco smoker 06/12/2017   Benign colonic polyp 11/02/2016   Needs flu shot 07/08/2016   Osteopenia 02/17/2016   Essential hypertension 02/09/2016   Special screening for malignant neoplasms, colon 03/05/2015   Hyperlipidemia 12/16/2014   COPD with emphysema ([place]) 12/15/2014   SHOULDER PAIN 08/24/2010   HEMATURIA UNSPECIFIED 06/11/2009   ERECTILE DYSFUNCTION, ORGANIC 06/11/2009   SEBORRHEIC KERATOSIS 06/11/2009   LUMBAGO 06/11/2009    Past Surgical History:  Procedure Laterality Date    COLONOSCOPY     INNER EAR SURGERY     hole in eardrum 30 years ago       Home Medications    Prior to Admission medications   Medication Sig Start Date End Date Taking? Authorizing Provider  albuterol (VENTOLIN HFA) 108 (90 Base) MCG/ACT inhaler Inhale 2 puffs into the lungs every 6 (six) hours as needed for wheezing. 02/24/21  Yes Icard, Bradley L, DO  amLODipine (NORVASC) 10 MG tablet Take 1 tablet (10 mg total) by mouth daily. 09/10/21  Yes Luetta Nutting, DO  Ascorbic Acid (VITAMIN C) 1000 MG tablet Take 1,000 mg by mouth daily.   Yes [provider]  atorvastatin (LIPITOR) 10 MG tablet TAKE 1 TABLET BY MOUTH EVERY DAY/LABS FOR REFILLS 08/05/21  Yes Luetta Nutting, DO  Budeson-Glycopyrrol-Formoterol (BREZTRI AEROSPHERE) 160-9-4.8 MCG/ACT AERO Inhale 2 puffs into the lungs in the morning and at bedtime. Patient taking differently: Inhale 2 puffs into the lungs in the morning and at bedtime. As needed. 02/24/21  Yes Icard, Bradley L, DO  fluticasone (FLONASE) 50 MCG/ACT nasal spray Place 2 sprays into both nostrils daily. 11/16/21  Yes Luetta Nutting, DO  ibuprofen (ADVIL) 600 MG tablet Take 1 tablet (600 mg total) by mouth every 8 (eight) hours as needed. Patient taking differently: Take 600 mg by mouth every 8 (eight) hours as needed for mild pain. 11/19/20  Yes Emeterio Reeve, DO  ipratropium (ATROVENT) 0.06 % nasal spray Place 2 sprays into both nostrils 2 (two) times daily.  11/26/21  Yes Kandra Nicolas, MD  Multiple Vitamins-Minerals (CENTRUM PO) Take 1 tablet by mouth daily.   Yes [provider]  predniSONE (DELTASONE) 50 MG tablet Take 1 tab po daily x5 days 11/16/21  Yes Luetta Nutting, DO  Salicylic Acid 3 % SHAM Apply 1 application topically 3 (three) times a week. 03/10/21  Yes Emeterio Reeve, DO  Spacer/Aero-Holding Chambers DEVI USE WITH INHALER AS DIRECTED 12/23/20  Yes Garner Nash, DO    Family History Family History  Problem Relation Age of  Onset   Cancer Father        mouth cancer     Social History Social History   Tobacco Use   Smoking status: Former    Packs/day: 0.50    Types: Cigarettes    Quit date: 10/24/2020    Years since quitting: 1.0   Smokeless tobacco: Never  Vaping Use   Vaping Use: Never used  Substance Use Topics   Alcohol use: Yes    Alcohol/week: 0.0 standard drinks    Comment: 2-3 drinks per week    Drug use: No     Allergies   Patient has no known allergies.   Review of Systems Review of Systems No sore throat No cough No pleuritic pain No wheezing + nasal congestion ? post-nasal drainage No sinus pain/pressure No itchy/red eyes No earache No hemoptysis No SOB No fever/chills No nausea No vomiting No abdominal pain No diarrhea No urinary symptoms No skin rash No fatigue No myalgias No headache   Physical Exam Triage Vital Signs ED Triage Vitals  Enc Vitals Group     BP 11/26/21 0824 (!) 150/77     Pulse Rate 11/26/21 0824 68     Resp 11/26/21 0824 18     Temp 11/26/21 0824 98.1 F (36.7 C)     Temp Source 11/26/21 0824 Oral     SpO2 11/26/21 0824 98 %     Weight 11/26/21 0822 130 lb (59 kg)     Height 11/26/21 0822 5\' 9"  (1.753 m)     Head Circumference --      Peak Flow --      Pain Score 11/26/21 0822 0     Pain Loc --      Pain Edu? --      Excl. in [place]? --    No data found.  Updated Vital Signs BP (!) 150/77 (BP Location: Left Arm)    Pulse 68    Temp 98.1 F (36.7 C) (Oral)    Resp 18    Ht 5\' 9"  (1.753 m)    Wt 59 kg    SpO2 98%    BMI 19.20 kg/m   Visual Acuity Right Eye Distance:   Left Eye Distance:   Bilateral Distance:    Right Eye Near:   Left Eye Near:    Bilateral Near:     Physical Exam Vitals and nursing note reviewed.  Constitutional:      General: He is not in acute distress. HENT:     Head: Normocephalic.     Comments: Small amount of cerumen in each canal but not impacted.    Right Ear: Tympanic membrane, ear canal and  external ear normal.     Left Ear: Tympanic membrane, ear canal and external ear normal.     Nose: Congestion present. No nasal deformity, nasal tenderness, mucosal edema or rhinorrhea.     Right Nostril: No occlusion.     Left Nostril:  No occlusion.     Right Turbinates: Enlarged.     Left Turbinates: Enlarged.     Right Sinus: No maxillary sinus tenderness or frontal sinus tenderness.     Left Sinus: No maxillary sinus tenderness or frontal sinus tenderness.     Mouth/Throat:     Mouth: Mucous membranes are moist.     Pharynx: Oropharynx is clear.  Eyes:     Extraocular Movements: Extraocular movements intact.     Conjunctiva/sclera: Conjunctivae normal.     Pupils: Pupils are equal, round, and reactive to light.  Cardiovascular:     Rate and Rhythm: Normal rate.  Pulmonary:     Effort: Pulmonary effort is normal.  Musculoskeletal:     Cervical back: Neck supple.  Lymphadenopathy:     Cervical: No cervical adenopathy.  Skin:    General: Skin is warm and dry.  Neurological:     Mental Status: He is alert.     UC Treatments / Results  Labs (all labs ordered are listed, but only abnormal results are displayed) Labs Reviewed - No data to display  EKG   Radiology No results found.  Procedures Procedures (including critical care time)  Medications Ordered in UC Medications - No data to display  Initial Impression / Assessment and Plan / UC Course  I have reviewed the triage vital signs and the nursing notes.  Pertinent labs & imaging results that were available during my care of the patient were reviewed by me and considered in my medical decision making (see chart for details).    No evidence sinusitis.  Will begin trial of Atrovent nasal. Followup with ENT if not improved one month.  Final Clinical Impressions(s) / UC Diagnoses   Final diagnoses:  Nasal congestion     Discharge Instructions      Recommend using saline nasal spray several times daily  and saline nasal irrigation (AYR is a common brand).   Continue Flonase nasal spray each morning after using saline nasal irrigation.        ED Prescriptions     Medication Sig Dispense Auth. Provider   ipratropium (ATROVENT) 0.06 % nasal spray Place 2 sprays into both nostrils 2 (two) times daily. 15 mL Kandra Nicolas, MD         Kandra Nicolas, MD 11/29/21 [phone]

## 2021-11-26 NOTE — Discharge Instructions (Signed)
Recommend using saline nasal spray several times daily and saline nasal irrigation (AYR is a common brand).   Continue Flonase nasal spray each morning after using saline nasal irrigation.

## 2021-11-30 DIAGNOSIS — R0981 Nasal congestion: Secondary | ICD-10-CM | POA: Diagnosis not present

## 2021-11-30 DIAGNOSIS — J310 Chronic rhinitis: Secondary | ICD-10-CM | POA: Diagnosis not present

## 2021-11-30 DIAGNOSIS — J3489 Other specified disorders of nose and nasal sinuses: Secondary | ICD-10-CM | POA: Diagnosis not present

## 2021-11-30 DIAGNOSIS — J342 Deviated nasal septum: Secondary | ICD-10-CM | POA: Diagnosis not present

## 2021-12-06 ENCOUNTER — Encounter: Payer: Self-pay | Admitting: Pulmonary Disease

## 2021-12-06 MED ORDER — BREZTRI AEROSPHERE 160-9-4.8 MCG/ACT IN AERO: 2.0000 | INHALATION_SPRAY | Freq: Two times a day (BID) | RESPIRATORY_TRACT | 6 refills | Status: DC

## 2021-12-06 MED ORDER — ALBUTEROL SULFATE HFA 108 (90 BASE) MCG/ACT IN AERS: 2.0000 | INHALATION_SPRAY | Freq: Four times a day (QID) | RESPIRATORY_TRACT | 5 refills | Status: DC | PRN

## 2021-12-08 ENCOUNTER — Ambulatory Visit (INDEPENDENT_AMBULATORY_CARE_PROVIDER_SITE_OTHER): Payer: Medicare Other | Admitting: Family Medicine

## 2021-12-08 ENCOUNTER — Encounter: Payer: Self-pay | Admitting: Family Medicine

## 2021-12-08 ENCOUNTER — Other Ambulatory Visit: Payer: Self-pay

## 2021-12-08 VITALS — BP 130/62 | HR 67 | Resp 16 | Ht 69.0 in | Wt 137.0 lb

## 2021-12-08 DIAGNOSIS — I10 Essential (primary) hypertension: Secondary | ICD-10-CM | POA: Diagnosis not present

## 2021-12-08 DIAGNOSIS — J432 Centrilobular emphysema: Secondary | ICD-10-CM

## 2021-12-08 DIAGNOSIS — F4329 Adjustment disorder with other symptoms: Secondary | ICD-10-CM | POA: Diagnosis not present

## 2021-12-08 DIAGNOSIS — R0981 Nasal congestion: Secondary | ICD-10-CM

## 2021-12-08 MED ORDER — ESCITALOPRAM OXALATE 5 MG PO TABS: 5.0000 mg | ORAL_TABLET | Freq: Every day | ORAL | 1 refills | Status: DC

## 2021-12-08 MED ORDER — BREZTRI AEROSPHERE 160-9-4.8 MCG/ACT IN AERO: 2.0000 | INHALATION_SPRAY | Freq: Two times a day (BID) | RESPIRATORY_TRACT | 3 refills | Status: DC

## 2021-12-08 NOTE — Assessment & Plan Note (Addendum)
Stable on current regimen.  Did send over new prescription to the pharmacy for [facility].  Written as a 90-day supply.

## 2021-12-08 NOTE — Assessment & Plan Note (Signed)
Pressure looks great today.  Doing well on his amlodipine 10 mg daily.  Looks like he is good on refills until next November.

## 2021-12-08 NOTE — Assessment & Plan Note (Signed)
Stress and adjustment reaction-I think this is directly related to increased stressors at home and being the primary caregiver for his wife.  We discussed some options including working with a therapist or counselor and possibly medication.  He was open to medication today so we discussed starting a low-dose of Lexapro.  Wanted to do something that was not habit-forming and that would hopefully be helpful would like to see him back in about 3 to 4 weeks before we refill the medication to make sure that it is helpful its effective and make any dose adjustments needed or even change the medication if needed.

## 2021-12-08 NOTE — Assessment & Plan Note (Signed)
Really is already doing what he can he is using nasal saline rinses adding a nasal steroid spray.  Using a humidifier.  The only other suggestion that I would have at this point would be that this could be allergic.  Especially since he is already had a scope to rule out polyps etc.  He could consider adding a dust mite cover to his pillowcase to see if this helps especially since the congestion seems to be more of a nighttime issue.  Or could consider referral for allergy testing for further work-up.

## 2021-12-08 NOTE — Progress Notes (Signed)
Established Patient Office Visit  Subjective:  Patient ID: Timothy Waller, male    DOB: 12-Dec-1933  Age: 88 y.o. MRN: [id number]  CC: No chief complaint on file.   HPI Timothy Waller presents for   Follow-up of nasal congestion.  He was originally seen by one of my partners for nasal congestion.  He said often times he will wake up with it in the middle the night most feel like he is suffocating because it is dark and then he feels like he cannot breathe and it really triggers some anxiety.  He does not really have a lot of mucus production.  He was started on a nasal steroid spray.  His symptoms persisted and even got a little worse.  So he ended up going to urgent care and then they referred him to ENT.  He did have a scope and they ruled out other causes he does have a deviated septum.  They added some nasal rinses which do help some.  He is also been running a humidifier.  And again denies any excess mucus production.  He says in general he has just been more stressed and concerned about his wife whose health is failing.  She is having significant memory issues and this has been stressful.  Sometimes will even wake up early in the morning around 5 AM and then just feel like he has to get up and has to start his day he cannot feel relaxed or unwind or even go back to bed.  Hypertension- Pt denies chest pain, SOB, dizziness, or heart palpitations.  Taking meds as directed w/o problems.  Denies medication side effects.    F/u COPD with emphysema -recent flares or exacerbations he does need a new prescription for Breztri sent to the pharmacy.  Past Medical History:  Diagnosis Date   Abnormal weight loss    COPD (chronic obstructive pulmonary disease) (HCC)    Hyperlipidemia    Osteoporosis 09/01/2010   Qualifier: Diagnosis of  By: McCrimmon CMA, (AAMA), Seth Bake      Past Surgical History:  Procedure Laterality Date   COLONOSCOPY     INNER EAR SURGERY     hole in eardrum 30 years ago     Family History  Problem Relation Age of Onset   Cancer Father        mouth cancer     Social History   Socioeconomic History   Marital status: Married    Spouse name: Anne Ng   Number of children: 4   Years of education: 12   Highest education level: 12th grade  Occupational History    Comment: Retired.  Tobacco Use   Smoking status: Former    Packs/day: 0.50    Types: Cigarettes    Quit date: 10/24/2020    Years since quitting: 1.1   Smokeless tobacco: Never  Vaping Use   Vaping Use: Never used  Substance and Sexual Activity   Alcohol use: Yes    Alcohol/week: 0.0 standard drinks    Comment: 2-3 drinks per week    Drug use: No   Sexual activity: Not Currently  Other Topics Concern   Not on file  Social History Narrative   Lives with his wife. He has four children. He likes to watch tv and do a little yard work in his free time.   Social Determinants of Health   Financial Resource Strain: Low Risk    Difficulty of Paying Living Expenses: Not hard at all  Food Insecurity: No Food Insecurity   Worried About Charity fundraiser in the Last Year: Never true   Ran Out of Food in the Last Year: Never true  Transportation Needs: No Transportation Needs   Lack of Transportation (Medical): No   Lack of Transportation (Non-Medical): No  Physical Activity: Inactive   Days of Exercise per Week: 0 days   Minutes of Exercise per Session: 0 min  Stress: No Stress Concern Present   Feeling of Stress : Not at all  Social Connections: Moderately Integrated   Frequency of Communication with Friends and Family: Twice a week   Frequency of Social Gatherings with Friends and Family: Twice a week   Attends Religious Services: 1 to 4 times per year   Active Member of [employer] or Organizations: No   Attends Music therapist: Never   Marital Status: Married  Human resources officer Violence: Not At Risk   Fear of Current or Ex-Partner: No   Emotionally Abused: No    Physically Abused: No   Sexually Abused: No    Outpatient Medications Prior to Visit  Medication Sig Dispense Refill   albuterol (VENTOLIN HFA) 108 (90 Base) MCG/ACT inhaler Inhale 2 puffs into the lungs every 6 (six) hours as needed for wheezing. 2 each 5   amLODipine (NORVASC) 10 MG tablet Take 1 tablet (10 mg total) by mouth daily. 90 tablet 3   Ascorbic Acid (VITAMIN C) 1000 MG tablet Take 1,000 mg by mouth daily.     atorvastatin (LIPITOR) 10 MG tablet TAKE 1 TABLET BY MOUTH EVERY DAY/LABS FOR REFILLS 90 tablet 2   fluticasone (FLONASE) 50 MCG/ACT nasal spray Place 2 sprays into both nostrils daily. 16 g 6   ibuprofen (ADVIL) 600 MG tablet Take 1 tablet (600 mg total) by mouth every 8 (eight) hours as needed. (Patient taking differently: Take 600 mg by mouth every 8 (eight) hours as needed for mild pain.) 30 tablet 0   ipratropium (ATROVENT) 0.06 % nasal spray Place 2 sprays into both nostrils 2 (two) times daily. 15 mL 1   Multiple Vitamins-Minerals (CENTRUM PO) Take 1 tablet by mouth daily.     Salicylic Acid 3 % SHAM Apply 1 application topically 3 (three) times a week. 236 mL 5   Spacer/Aero-Holding Chambers DEVI USE WITH INHALER AS DIRECTED 1 each 0   Budeson-Glycopyrrol-Formoterol (BREZTRI AEROSPHERE) 160-9-4.8 MCG/ACT AERO Inhale 2 puffs into the lungs in the morning and at bedtime. 19.2 g 6   predniSONE (DELTASONE) 50 MG tablet Take 1 tab po daily x5 days 5 tablet 0   No facility-administered medications prior to visit.    No Known Allergies  ROS Review of Systems    Objective:    Physical Exam Constitutional:      Appearance: Normal appearance. He is well-developed.  HENT:     Head: Normocephalic and atraumatic.  Cardiovascular:     Rate and Rhythm: Normal rate and regular rhythm.     Heart sounds: Normal heart sounds.  Pulmonary:     Effort: Pulmonary effort is normal.     Breath sounds: Normal breath sounds.  Skin:    General: Skin is warm and dry.   Neurological:     Mental Status: He is alert and oriented to person, place, and time. Mental status is at baseline.  Psychiatric:        Behavior: Behavior normal.   BP 130/62    Pulse 67    Resp 16  Ht 5\' 9"  (1.753 m)    Wt 137 lb (62.1 kg)    SpO2 96%    BMI 20.23 kg/m  Wt Readings from Last 3 Encounters:  12/08/21 137 lb (62.1 kg)  11/26/21 130 lb (59 kg)  11/16/21 136 lb (61.7 kg)     Health Maintenance Due  Topic Date Due   COVID-19 Vaccine (4 - Booster for Pfizer series) 11/16/2020    There are no preventive care reminders to display for this patient.  Lab Results  Component Value Date   TSH 1.68 11/19/2020   Lab Results  Component Value Date   WBC 5.9 12/08/2020   HGB 9.0 (L) 12/08/2020   HCT 28.1 (L) 12/08/2020   MCV 85.7 12/08/2020   PLT 527 (H) 12/08/2020   Lab Results  Component Value Date   NA 138 12/08/2020   K 4.7 12/08/2020   CO2 29 12/08/2020   GLUCOSE 72 12/08/2020   BUN 25 12/08/2020   CREATININE 0.97 12/08/2020   BILITOT 0.2 12/08/2020   ALKPHOS 59 11/02/2016   AST 71 (H) 12/08/2020   ALT 86 (H) 12/08/2020   PROT 6.7 12/08/2020   ALBUMIN 2.2 (L) 11/26/2020   CALCIUM 8.8 12/08/2020   ANIONGAP 9 11/28/2020   Lab Results  Component Value Date   CHOL 178 11/06/2018   Lab Results  Component Value Date   HDL 86 11/06/2018   Lab Results  Component Value Date   LDLCALC 77 11/06/2018   Lab Results  Component Value Date   TRIG 71 11/06/2018   Lab Results  Component Value Date   CHOLHDL 2.1 11/06/2018   Lab Results  Component Value Date   HGBA1C 5.8 (H) 11/06/2018      Assessment & Plan:   Problem List Items Addressed This Visit       Cardiovascular and Mediastinum   Essential hypertension - Primary (Chronic)    Pressure looks great today.  Doing well on his amlodipine 10 mg daily.  Looks like he is good on refills until next November.      Relevant Orders   Lipid Panel w/reflex Direct LDL   COMPLETE METABOLIC PANEL  WITH GFR   CBC     Respiratory   Centrilobular emphysema (HCC)    Stable on current regimen.  Did send over new prescription to the pharmacy for [facility].  Written as a 90-day supply.      Relevant Medications   Budeson-Glycopyrrol-Formoterol (BREZTRI AEROSPHERE) 160-9-4.8 MCG/ACT AERO   Other Relevant Orders   Lipid Panel w/reflex Direct LDL   COMPLETE METABOLIC PANEL WITH GFR   CBC     Other   Stress and adjustment reaction    Stress and adjustment reaction-I think this is directly related to increased stressors at home and being the primary caregiver for his wife.  We discussed some options including working with a therapist or counselor and possibly medication.  He was open to medication today so we discussed starting a low-dose of Lexapro.  Wanted to do something that was not habit-forming and that would hopefully be helpful would like to see him back in about 3 to 4 weeks before we refill the medication to make sure that it is helpful its effective and make any dose adjustments needed or even change the medication if needed.      Nasal congestion    Really is already doing what he can he is using nasal saline rinses adding a nasal steroid spray.  Using a humidifier.  The only other suggestion that I would have at this point would be that this could be allergic.  Especially since he is already had a scope to rule out polyps etc.  He could consider adding a dust mite cover to his pillowcase to see if this helps especially since the congestion seems to be more of a nighttime issue.  Or could consider referral for allergy testing for further work-up.       Meds ordered this encounter  Medications   Budeson-Glycopyrrol-Formoterol (BREZTRI AEROSPHERE) 160-9-4.8 MCG/ACT AERO    Sig: Inhale 2 puffs into the lungs in the morning and at bedtime.    Dispense:  32.1 g    Refill:  3    Order Specific Question:   Lot Number?    Answer:   [id number]    Order Specific Question:   Manufacturer?     Answer:   [employer] [[id number]]    Order Specific Question:   Quantity    Answer:   4   escitalopram (LEXAPRO) 5 MG tablet    Sig: Take 1 tablet (5 mg total) by mouth daily.    Dispense:  30 tablet    Refill:  1    Follow-up: Return in about 22 days (around 12/30/2021) for New start medication.    Beatrice Lecher, MD

## 2021-12-09 LAB — CBC
HCT: 37.2 % — ABNORMAL LOW (ref 38.5–50.0)
Hemoglobin: 12.5 g/dL — ABNORMAL LOW (ref 13.2–17.1)
MCH: 30.1 pg (ref 27.0–33.0)
MCHC: 33.6 g/dL (ref 32.0–36.0)
MCV: 89.6 fL (ref 80.0–100.0)
MPV: 9.8 fL (ref 7.5–12.5)
Platelets: 220 10*3/uL (ref 140–400)
RBC: 4.15 10*6/uL — ABNORMAL LOW (ref 4.20–5.80)
RDW: 13.4 % (ref 11.0–15.0)
WBC: 5.3 10*3/uL (ref 3.8–10.8)

## 2021-12-09 LAB — COMPLETE METABOLIC PANEL WITH GFR
AG Ratio: 1.2 (calc) (ref 1.0–2.5)
ALT: 21 U/L (ref 9–46)
AST: 26 U/L (ref 10–35)
Albumin: 4.1 g/dL (ref 3.6–5.1)
Alkaline phosphatase (APISO): 57 U/L (ref 35–144)
BUN/Creatinine Ratio: 24 (calc) — ABNORMAL HIGH (ref 6–22)
BUN: 31 mg/dL — ABNORMAL HIGH (ref 7–25)
CO2: 26 mmol/L (ref 20–32)
Calcium: 9.6 mg/dL (ref 8.6–10.3)
Chloride: 103 mmol/L (ref 98–110)
Creat: 1.29 mg/dL — ABNORMAL HIGH (ref 0.70–1.22)
Globulin: 3.3 g/dL (calc) (ref 1.9–3.7)
Glucose, Bld: 96 mg/dL (ref 65–99)
Potassium: 4.4 mmol/L (ref 3.5–5.3)
Sodium: 138 mmol/L (ref 135–146)
Total Bilirubin: 0.9 mg/dL (ref 0.2–1.2)
Total Protein: 7.4 g/dL (ref 6.1–8.1)
eGFR: 54 mL/min/{1.73_m2} — ABNORMAL LOW (ref >=60)

## 2021-12-09 LAB — LIPID PANEL W/REFLEX DIRECT LDL
Cholesterol: 188 mg/dL (ref <200)
HDL: 108 mg/dL (ref >=40)
LDL Cholesterol (Calc): 66 mg/dL (calc)
Non-HDL Cholesterol (Calc): 80 mg/dL (calc) (ref <130)
Total CHOL/HDL Ratio: 1.7 (calc) (ref <5.0)
Triglycerides: 55 mg/dL (ref <150)

## 2021-12-09 NOTE — Progress Notes (Signed)
Hi Timothy Waller,  I apologize for the misspelling of your name.  I was using my dictation software and did not catch it, before I hit except.

## 2021-12-09 NOTE — Progress Notes (Signed)
Hi Mr. Au.D., kidney function jumped up just a little bit but you also look a little bit dehydrated on your blood work.  Just make sure you are drinking plenty of fluids.  I do want to keep a close eye on that and plan to recheck your renal function again in about 3 months.  Just to make sure that its not trending up.  But again it just looks more like you are a little bit dry on your blood work.  Your protein levels look better this time compared to a year ago when they were little low.  Your liver function is also back to normal which is fantastic.  You are still mildly anemic with a hemoglobin of 12.5 but again better than a year ago when it was 9.  To see if the lab can add on some iron levels.  Your cholesterol looks great.  Please call the lab and see if we can add an iron panel.

## 2021-12-10 ENCOUNTER — Other Ambulatory Visit: Payer: Self-pay

## 2021-12-10 MED ORDER — BREZTRI AEROSPHERE 160-9-4.8 MCG/ACT IN AERO: 2.0000 | INHALATION_SPRAY | Freq: Two times a day (BID) | RESPIRATORY_TRACT | 0 refills | Status: DC

## 2021-12-13 ENCOUNTER — Telehealth: Payer: Self-pay

## 2021-12-13 NOTE — Telephone Encounter (Signed)
Patient Advocate Encounter  Received notification from [facility] that the request for prior authorization for Timothy Waller has been denied due to the patient not trying the preferred med., Trelegy.     Specialty Pharmacy Patient Advocate Fax: [phone]

## 2021-12-15 NOTE — Telephone Encounter (Signed)
Please advise on pt's email. Are there any cheaper covered alternatives to [facility].

## 2021-12-16 ENCOUNTER — Other Ambulatory Visit (HOSPITAL_COMMUNITY): Payer: Self-pay

## 2021-12-20 MED ORDER — TRELEGY ELLIPTA 100-62.5-25 MCG/ACT IN AEPB: 1.0000 | INHALATION_SPRAY | Freq: Every day | RESPIRATORY_TRACT | 0 refills | Status: DC

## 2021-12-20 NOTE — Addendum Note (Signed)
Addended by: Dessie Coma on: 12/20/2021 05:27 PM   Modules accepted: Orders

## 2021-12-21 ENCOUNTER — Ambulatory Visit: Payer: Medicare Other | Admitting: Family Medicine

## 2022-01-03 ENCOUNTER — Other Ambulatory Visit: Payer: Self-pay

## 2022-01-03 ENCOUNTER — Encounter: Payer: Self-pay | Admitting: Family Medicine

## 2022-01-03 ENCOUNTER — Ambulatory Visit (INDEPENDENT_AMBULATORY_CARE_PROVIDER_SITE_OTHER): Payer: Medicare Other | Admitting: Family Medicine

## 2022-01-03 VITALS — BP 134/50 | HR 66 | Ht 69.0 in | Wt 142.0 lb

## 2022-01-03 DIAGNOSIS — D649 Anemia, unspecified: Secondary | ICD-10-CM | POA: Diagnosis not present

## 2022-01-03 DIAGNOSIS — F4329 Adjustment disorder with other symptoms: Secondary | ICD-10-CM

## 2022-01-03 DIAGNOSIS — R7989 Other specified abnormal findings of blood chemistry: Secondary | ICD-10-CM

## 2022-01-03 DIAGNOSIS — R0981 Nasal congestion: Secondary | ICD-10-CM

## 2022-01-03 MED ORDER — ESCITALOPRAM OXALATE 5 MG PO TABS: 5.0000 mg | ORAL_TABLET | Freq: Every day | ORAL | 1 refills | Status: DC

## 2022-01-03 NOTE — Assessment & Plan Note (Signed)
Up with ENT next week so hopefully they can make some further recommendations to provide some relief. ?

## 2022-01-03 NOTE — Assessment & Plan Note (Signed)
Continue current regimen of Lexapro 5 mg he is happy with his current dose and feels like its been effective.  Reminded him that if at any point he feels like he is struggling to please let us know.  We could consider therapist referral and/or increasing the Lexapro if needed.  90-day supply sent to pharmacy. ?

## 2022-01-03 NOTE — Progress Notes (Signed)
Established Patient Office Visit  Subjective:  Patient ID: Timothy Waller, male    DOB: [birth year]  Age: 88 y.o. MRN: [id number]  CC: No chief complaint on file.   HPI Timothy Waller presents for follow-up of new start medication.  He has had some increase stressors being the primary caregiver for his wife whose dementia is advancing.  We discussed options of therapy/counseling versus medication and he opted to start with medication started a low-dose of Lexapro and had him follow-up today in 3 to 4 weeks.  Actually doing well he feels like its been helpful he wants to stay on his current dose no changes today.  He denies significant problems with sleep.  Dealing with waking up with a lot of sinus congestion in fact has a follow-up with ENT next week.  He has been using a saline solution and it does help at least in the evenings.  But his nose gets very dry in the evenings and he has been running a humidifier as well.  Pulmonology notes had to switch from [place] to Trelegy because of insurance preference.  Past Medical History:  Diagnosis Date   Abnormal weight loss    COPD (chronic obstructive pulmonary disease) (HCC)    Hyperlipidemia    Osteoporosis 09/01/2010   Qualifier: Diagnosis of  By: McCrimmon CMA, (AAMA), Sue Lush      Past Surgical History:  Procedure Laterality Date   COLONOSCOPY     INNER EAR SURGERY     hole in eardrum 30 years ago    Family History  Problem Relation Age of Onset   Cancer Father        mouth cancer     Social History   Socioeconomic History   Marital status: Married    Spouse name: Drinda Butts   Number of children: 4   Years of education: 12   Highest education level: 12th grade  Occupational History    Comment: Retired.  Tobacco Use   Smoking status: Former    Packs/day: 0.50    Types: Cigarettes    Quit date: 10/24/2020    Years since quitting: 1.1   Smokeless tobacco: Never  Vaping Use   Vaping Use: Never used  Substance and Sexual  Activity   Alcohol use: Yes    Alcohol/week: 0.0 standard drinks    Comment: 2-3 drinks per week    Drug use: No   Sexual activity: Not Currently  Other Topics Concern   Not on file  Social History Narrative   Lives with his wife. He has four children. He likes to watch tv and do a little yard work in his free time.   Social Determinants of Health   Financial Resource Strain: Low Risk    Difficulty of Paying Living Expenses: Not hard at all  Food Insecurity: No Food Insecurity   Worried About Programme researcher, broadcasting/film/video in the Last Year: Never true   Ran Out of Food in the Last Year: Never true  Transportation Needs: No Transportation Needs   Lack of Transportation (Medical): No   Lack of Transportation (Non-Medical): No  Physical Activity: Inactive   Days of Exercise per Week: 0 days   Minutes of Exercise per Session: 0 min  Stress: No Stress Concern Present   Feeling of Stress : Not at all  Social Connections: Moderately Integrated   Frequency of Communication with Friends and Family: Twice a week   Frequency of Social Gatherings with Friends and Family: Twice a  week   Attends Religious Services: 1 to 4 times per year   Active Member of Clubs or Organizations: No   Attends Archivist Meetings: Never   Marital Status: Married  Human resources officer Violence: Not At Risk   Fear of Current or Ex-Partner: No   Emotionally Abused: No   Physically Abused: No   Sexually Abused: No    Outpatient Medications Prior to Visit  Medication Sig Dispense Refill   albuterol (VENTOLIN HFA) 108 (90 Base) MCG/ACT inhaler Inhale 2 puffs into the lungs every 6 (six) hours as needed for wheezing. 2 each 5   amLODipine (NORVASC) 10 MG tablet Take 1 tablet (10 mg total) by mouth daily. 90 tablet 3   Ascorbic Acid (VITAMIN C) 1000 MG tablet Take 1,000 mg by mouth daily.     atorvastatin (LIPITOR) 10 MG tablet TAKE 1 TABLET BY MOUTH EVERY DAY/LABS FOR REFILLS 90 tablet 2   budesonide (PULMICORT)  0.5 MG/2ML nebulizer solution SMARTSIG:1 Both Nares Twice Daily     fluticasone (FLONASE) 50 MCG/ACT nasal spray Place 2 sprays into both nostrils daily. 16 g 6   Fluticasone-Umeclidin-Vilant (TRELEGY ELLIPTA) 100-62.5-25 MCG/ACT AEPB Inhale 1 puff into the lungs daily. 60 each 0   ipratropium (ATROVENT) 0.06 % nasal spray Place 2 sprays into both nostrils 2 (two) times daily. 15 mL 1   Multiple Vitamins-Minerals (CENTRUM PO) Take 1 tablet by mouth daily.     Salicylic Acid 3 % SHAM Apply 1 application topically 3 (three) times a week. 236 mL 5   Spacer/Aero-Holding Chambers DEVI USE WITH INHALER AS DIRECTED 1 each 0   escitalopram (LEXAPRO) 5 MG tablet Take 1 tablet (5 mg total) by mouth daily. 30 tablet 1   ibuprofen (ADVIL) 600 MG tablet Take 1 tablet (600 mg total) by mouth every 8 (eight) hours as needed. (Patient taking differently: Take 600 mg by mouth every 8 (eight) hours as needed for mild pain.) 30 tablet 0   No facility-administered medications prior to visit.    No Known Allergies  ROS Review of Systems    Objective:    Physical Exam Constitutional:      Appearance: Normal appearance. He is well-developed.  HENT:     Head: Normocephalic and atraumatic.  Cardiovascular:     Rate and Rhythm: Normal rate and regular rhythm.     Heart sounds: Normal heart sounds.  Pulmonary:     Effort: Pulmonary effort is normal.     Breath sounds: Normal breath sounds.  Skin:    General: Skin is warm and dry.  Neurological:     Mental Status: He is alert and oriented to person, place, and time. Mental status is at baseline.  Psychiatric:        Behavior: Behavior normal.    BP (!) 134/50    Pulse 66    Ht $R'5\' 9"'Hg$  (1.753 m)    Wt 142 lb (64.4 kg)    SpO2 96%    BMI 20.97 kg/m  Wt Readings from Last 3 Encounters:  01/03/22 142 lb (64.4 kg)  12/08/21 137 lb (62.1 kg)  11/26/21 130 lb (59 kg)     Health Maintenance Due  Topic Date Due   COVID-19 Vaccine (4 - Booster for Pfizer  series) 11/16/2020    There are no preventive care reminders to display for this patient.  Lab Results  Component Value Date   TSH 1.68 11/19/2020   Lab Results  Component Value Date   WBC  5.3 12/08/2021   HGB 12.5 (L) 12/08/2021   HCT 37.2 (L) 12/08/2021   MCV 89.6 12/08/2021   PLT 220 12/08/2021   Lab Results  Component Value Date   NA 138 12/08/2021   K 4.4 12/08/2021   CO2 26 12/08/2021   GLUCOSE 96 12/08/2021   BUN 31 (H) 12/08/2021   CREATININE 1.29 (H) 12/08/2021   BILITOT 0.9 12/08/2021   ALKPHOS 59 11/02/2016   AST 26 12/08/2021   ALT 21 12/08/2021   PROT 7.4 12/08/2021   ALBUMIN 2.2 (L) 11/26/2020   CALCIUM 9.6 12/08/2021   ANIONGAP 9 11/28/2020   EGFR 54 (L) 12/08/2021   Lab Results  Component Value Date   CHOL 188 12/08/2021   Lab Results  Component Value Date   HDL 108 12/08/2021   Lab Results  Component Value Date   LDLCALC 66 12/08/2021   Lab Results  Component Value Date   TRIG 55 12/08/2021   Lab Results  Component Value Date   CHOLHDL 1.7 12/08/2021   Lab Results  Component Value Date   HGBA1C 5.8 (H) 11/06/2018      Assessment & Plan:   Problem List Items Addressed This Visit       Other   Stress and adjustment reaction - Primary    Continue current regimen of Lexapro 5 mg he is happy with his current dose and feels like its been effective.  Reminded him that if at any point he feels like he is struggling to please let us know.  We could consider therapist referral and/or increasing the Lexapro if needed.  90-day supply sent to pharmacy.      Relevant Medications   escitalopram (LEXAPRO) 5 MG tablet   Other Relevant Orders   BASIC METABOLIC PANEL WITH GFR   Fe+TIBC+Fer   Nasal congestion    Up with ENT next week so hopefully they can make some further recommendations to provide some relief.      Other Visit Diagnoses     Anemia, unspecified type       Relevant Orders   BASIC METABOLIC PANEL WITH GFR   Fe+TIBC+Fer    Elevated serum creatinine       Relevant Orders   BASIC METABOLIC PANEL WITH GFR   Fe+TIBC+Fer       On labs in February showing some mild anemia and elevated serum creatinine I would like to repeat those labs in May.  They went ahead and placed the order today.  Meds ordered this encounter  Medications   escitalopram (LEXAPRO) 5 MG tablet    Sig: Take 1 tablet (5 mg total) by mouth daily.    Dispense:  90 tablet    Refill:  1    Follow-up: No follow-ups on file.    Beatrice Lecher, MD

## 2022-01-03 NOTE — Patient Instructions (Signed)
Please go to the lab, or come up here for blood draw, in May to recheck your kidney function. You don't have to fast.   ?

## 2022-01-06 ENCOUNTER — Encounter: Payer: Self-pay | Admitting: Family Medicine

## 2022-01-10 NOTE — Telephone Encounter (Signed)
Dr. Valeta Harms, please advise on the strength of Trelegy. Thanks! ?

## 2022-01-11 MED ORDER — TRELEGY ELLIPTA 100-62.5-25 MCG/ACT IN AEPB: 1.0000 | INHALATION_SPRAY | Freq: Every day | RESPIRATORY_TRACT | 2 refills | Status: DC

## 2022-01-13 DIAGNOSIS — R0981 Nasal congestion: Secondary | ICD-10-CM | POA: Diagnosis not present

## 2022-02-24 ENCOUNTER — Ambulatory Visit (INDEPENDENT_AMBULATORY_CARE_PROVIDER_SITE_OTHER): Payer: Medicare Other | Admitting: Pulmonary Disease

## 2022-02-24 DIAGNOSIS — J432 Centrilobular emphysema: Secondary | ICD-10-CM | POA: Diagnosis not present

## 2022-02-24 LAB — PULMONARY FUNCTION TEST
DL/VA % pred: 101 %
DL/VA: 3.92 ml/min/mmHg/L
DLCO cor % pred: 54 %
DLCO cor: 11.83 ml/min/mmHg
DLCO unc % pred: 54 %
DLCO unc: 11.83 ml/min/mmHg
FEF 25-75 Post: 0.55 L/sec
FEF 25-75 Pre: 0.48 L/sec
FEF2575-%Change-Post: 14 %
FEF2575-%Pred-Post: 37 %
FEF2575-%Pred-Pre: 32 %
FEV1-%Change-Post: 3 %
FEV1-%Pred-Post: 55 %
FEV1-%Pred-Pre: 53 %
FEV1-Post: 1.27 L
FEV1-Pre: 1.23 L
FEV1FVC-%Change-Post: 0 %
FEV1FVC-%Pred-Pre: 77 %
FEV6-%Change-Post: 3 %
FEV6-%Pred-Post: 73 %
FEV6-%Pred-Pre: 70 %
FEV6-Post: 2.24 L
FEV6-Pre: 2.16 L
FEV6FVC-%Change-Post: 0 %
FEV6FVC-%Pred-Post: 105 %
FEV6FVC-%Pred-Pre: 104 %
FVC-%Change-Post: 3 %
FVC-%Pred-Post: 70 %
FVC-%Pred-Pre: 67 %
FVC-Post: 2.33 L
FVC-Pre: 2.25 L
Post FEV1/FVC ratio: 55 %
Post FEV6/FVC ratio: 97 %
Pre FEV1/FVC ratio: 55 %
Pre FEV6/FVC Ratio: 96 %
RV % pred: 207 %
RV: 5.4 L
TLC % pred: 123 %
TLC: 8.02 L

## 2022-02-24 NOTE — Progress Notes (Signed)
Attempted Full PFT. Performed Pre/Post Spirometry and DLCO today. ? ?

## 2022-02-24 NOTE — Patient Instructions (Addendum)
Attempted Full PF. Performed Pre/Post Spirometry and DLCO today. ?

## 2022-03-02 ENCOUNTER — Encounter: Payer: Self-pay | Admitting: Pulmonary Disease

## 2022-03-02 ENCOUNTER — Ambulatory Visit (INDEPENDENT_AMBULATORY_CARE_PROVIDER_SITE_OTHER): Payer: Medicare Other | Admitting: Pulmonary Disease

## 2022-03-02 VITALS — BP 124/68 | HR 68 | Temp 97.8°F | Ht 68.0 in | Wt 140.6 lb

## 2022-03-02 DIAGNOSIS — J432 Centrilobular emphysema: Secondary | ICD-10-CM | POA: Diagnosis not present

## 2022-03-02 DIAGNOSIS — Z87891 Personal history of nicotine dependence: Secondary | ICD-10-CM

## 2022-03-02 DIAGNOSIS — K089 Disorder of teeth and supporting structures, unspecified: Secondary | ICD-10-CM | POA: Diagnosis not present

## 2022-03-02 DIAGNOSIS — J869 Pyothorax without fistula: Secondary | ICD-10-CM | POA: Diagnosis not present

## 2022-03-02 DIAGNOSIS — R911 Solitary pulmonary nodule: Secondary | ICD-10-CM

## 2022-03-02 MED ORDER — STIOLTO RESPIMAT 2.5-2.5 MCG/ACT IN AERS: 2.0000 | INHALATION_SPRAY | Freq: Every day | RESPIRATORY_TRACT | 1 refills | Status: DC

## 2022-03-02 MED ORDER — STIOLTO RESPIMAT 2.5-2.5 MCG/ACT IN AERS: 2.0000 | INHALATION_SPRAY | Freq: Every day | RESPIRATORY_TRACT | 0 refills | Status: DC

## 2022-03-02 NOTE — Progress Notes (Signed)
? ?Synopsis: Referred in February 2022 for hospital follow-up by Hali Marry, * ? ?Subjective:  ? ?PATIENT ID: Timothy Waller GENDER: male DOB: [birth year], MRN: [id number] ? ?Chief Complaint  ?Patient presents with  ? Follow-up  ?  Follow up. Patient has no complaints.   ? ? ?88 year old gentleman history of COPD hyperlipidemia.  Patient was recently discharged from the hospital on 11/29/2020.  Patient was found to have a large left-sided exudative pleural effusion pulmonary nodules.  Had a chest tube placed status post TPA and dornase.  Known emphysema COPD consistent with disease seen on CT.  Has severe protein calorie malnutrition and cachexia.  60-pack-year smoker quit December 2021.  Presented from home to [facility] for direct admission.  Followed by Dr. Sheppard Coil and Dr. Harrington Challenger in the outpatient.  He is a former Company secretary.  Previously managed on Spiriva.  Approximate 30 pound weight loss in the past year.  Covid booster in November 2021.  Patient was discharged on Augmentin.  He finishes his course of antibiotics tomorrow.  That will be 4 weeks of antimicrobial therapy for his parapneumonic effusion.  He had a repeat chest x-ray on December 08, 2018 to primary care office which shows small amount of pleural fluid remaining on the left side. ? ?OV 02/24/2021: Patient here today for follow-up after CT imaging.  Overall feels better since hospitalization.  And recent follow-up with me in clinic.  Currently managed on new triple therapy inhaler regimen.  Was given new prescription after last office visit.  Ultrasound in the office completed at previous visit with no residual fluid.  Patient's CT scan of the chest was completed on 02/15/2021 this reveals near complete resolution of the pleural fluid in the left chest.  Some mild pleural thickening remains.  Postinfectious scarring.  The irregular nodule in the left base much smaller considered related to the infection.  Evidence of emphysema still present.   Overall he is ? ?OV 03/02/2022: Here today for follow-up regarding COPD.  Recent CT scan of the chest reviewed back in September 2022.  Nodules are stable.  Not using any inhalers at this time.  Uses albuterol as needed.  He was unable to afford some of the other long-acting bronchodilator options.  I told him that we could work with his insurance to try to find something that was covered.  From respiratory standpoint doing okay.  We talked about the risk benefits of proceeding with ongoing surveillance imaging of his nodules.  He would like to not continue surveillance imaging at this time which is reasonable at the age of 65. ? ? ? ?Past Medical History:  ?Diagnosis Date  ? Abnormal weight loss   ? COPD (chronic obstructive pulmonary disease) ([place])   ? Hyperlipidemia   ? Osteoporosis 09/01/2010  ? Qualifier: Diagnosis of  By: McCrimmon CMA, (AAMA), Seth Bake    ?  ? ?Family History  ?Problem Relation Age of Onset  ? Cancer Father   ?     mouth cancer   ?  ? ?Past Surgical History:  ?Procedure Laterality Date  ? COLONOSCOPY    ? INNER EAR SURGERY    ? hole in eardrum 30 years ago  ? ? ?Social History  ? ?Socioeconomic History  ? Marital status: Married  ?  Spouse name: Anne Ng  ? Number of children: 4  ? Years of education: 43  ? Highest education level: 12th grade  ?Occupational History  ?  Comment: Retired.  ?Tobacco Use  ?  Smoking status: Former  ?  Packs/day: 0.50  ?  Types: Cigarettes  ?  Quit date: 10/24/2020  ?  Years since quitting: 1.3  ? Smokeless tobacco: Never  ?Vaping Use  ? Vaping Use: Never used  ?Substance and Sexual Activity  ? Alcohol use: Yes  ?  Alcohol/week: 0.0 standard drinks  ?  Comment: 2-3 drinks per week   ? Drug use: No  ? Sexual activity: Not Currently  ?Other Topics Concern  ? Not on file  ?Social History Narrative  ? Lives with his wife. He has four children. He likes to watch tv and do a little yard work in his free time.  ? ?Social Determinants of Health  ? ?Financial Resource Strain: Low  Risk   ? Difficulty of Paying Living Expenses: Not hard at all  ?Food Insecurity: No Food Insecurity  ? Worried About Charity fundraiser in the Last Year: Never true  ? Ran Out of Food in the Last Year: Never true  ?Transportation Needs: No Transportation Needs  ? Lack of Transportation (Medical): No  ? Lack of Transportation (Non-Medical): No  ?Physical Activity: Inactive  ? Days of Exercise per Week: 0 days  ? Minutes of Exercise per Session: 0 min  ?Stress: No Stress Concern Present  ? Feeling of Stress : Not at all  ?Social Connections: Moderately Integrated  ? Frequency of Communication with Friends and Family: Twice a week  ? Frequency of Social Gatherings with Friends and Family: Twice a week  ? Attends Religious Services: 1 to 4 times per year  ? Active Member of Clubs or Organizations: No  ? Attends Archivist Meetings: Never  ? Marital Status: Married  ?Intimate Partner Violence: Not At Risk  ? Fear of Current or Ex-Partner: No  ? Emotionally Abused: No  ? Physically Abused: No  ? Sexually Abused: No  ?  ? ?No Known Allergies  ? ?Outpatient Medications Prior to Visit  ?Medication Sig Dispense Refill  ? amLODipine (NORVASC) 10 MG tablet Take 1 tablet (10 mg total) by mouth daily. 90 tablet 3  ? Ascorbic Acid (VITAMIN C) 1000 MG tablet Take 1,000 mg by mouth daily.    ? atorvastatin (LIPITOR) 10 MG tablet TAKE 1 TABLET BY MOUTH EVERY DAY/LABS FOR REFILLS 90 tablet 2  ? escitalopram (LEXAPRO) 5 MG tablet Take 1 tablet (5 mg total) by mouth daily. 90 tablet 1  ? fluticasone (FLONASE) 50 MCG/ACT nasal spray Place 2 sprays into both nostrils daily. 16 g 6  ? ipratropium (ATROVENT) 0.06 % nasal spray Place 2 sprays into both nostrils 2 (two) times daily. 15 mL 1  ? Multiple Vitamins-Minerals (CENTRUM PO) Take 1 tablet by mouth daily.    ? Salicylic Acid 3 % SHAM Apply 1 application topically 3 (three) times a week. 236 mL 5  ? albuterol (VENTOLIN HFA) 108 (90 Base) MCG/ACT inhaler Inhale 2 puffs into  the lungs every 6 (six) hours as needed for wheezing. (Patient not taking: Reported on 03/02/2022) 2 each 5  ? budesonide (PULMICORT) 0.5 MG/2ML nebulizer solution SMARTSIG:1 Both Nares Twice Daily (Patient not taking: Reported on 03/02/2022)    ? Fluticasone-Umeclidin-Vilant (TRELEGY ELLIPTA) 100-62.5-25 MCG/ACT AEPB Inhale 1 puff into the lungs daily. (Patient not taking: Reported on 03/02/2022) 60 each 0  ? Fluticasone-Umeclidin-Vilant (TRELEGY ELLIPTA) 100-62.5-25 MCG/ACT AEPB Inhale 1 puff into the lungs daily. (Patient not taking: Reported on 03/02/2022) 60 each 2  ? Spacer/Aero-Holding Chambers DEVI USE WITH INHALER AS  DIRECTED (Patient not taking: Reported on 03/02/2022) 1 each 0  ? ?No facility-administered medications prior to visit.  ? ? ?Review of Systems  ?Constitutional:  Negative for chills, fever, malaise/fatigue and weight loss.  ?HENT:  Negative for hearing loss, sore throat and tinnitus.   ?Eyes:  Negative for blurred vision and double vision.  ?Respiratory:  Positive for cough and shortness of breath. Negative for hemoptysis, sputum production, wheezing and stridor.   ?Cardiovascular:  Negative for chest pain, palpitations, orthopnea, leg swelling and PND.  ?Gastrointestinal:  Negative for abdominal pain, constipation, diarrhea, heartburn, nausea and vomiting.  ?Genitourinary:  Negative for dysuria, hematuria and urgency.  ?Musculoskeletal:  Negative for joint pain and myalgias.  ?Skin:  Negative for itching and rash.  ?Neurological:  Negative for dizziness, tingling, weakness and headaches.  ?Endo/Heme/Allergies:  Negative for environmental allergies. Does not bruise/bleed easily.  ?Psychiatric/Behavioral:  Negative for depression. The patient is not nervous/anxious and does not have insomnia.   ?All other systems reviewed and are negative. ? ? ?Objective:  ?Physical Exam ?Vitals reviewed.  ?Constitutional:   ?   General: He is not in acute distress. ?   Appearance: He is well-developed.  ?HENT:  ?    Head: Normocephalic and atraumatic.  ?   Mouth/Throat:  ?   Pharynx: No oropharyngeal exudate.  ?Eyes:  ?   Conjunctiva/sclera: Conjunctivae normal.  ?   Pupils: Pupils are equal, round, and reactive to l

## 2022-03-02 NOTE — Patient Instructions (Signed)
Thank you for visiting Dr. Valeta Harms at [facility]. ?Today we recommend the following: ? ?Stiolto Samples and new prescription today  ?Vitamin D 4000 IU once daily  ? ?Return in about 1 year (around 03/03/2023), or if symptoms worsen or fail to improve, for with APP or Dr. Valeta Harms. ? ? ? ?Please do your part to reduce the spread of COVID-19.  ? ?

## 2022-03-02 NOTE — Addendum Note (Signed)
Addended by: Fran Lowes on: 03/02/2022 01:32 PM ? ? Modules accepted: Orders ? ?

## 2022-03-07 ENCOUNTER — Ambulatory Visit (INDEPENDENT_AMBULATORY_CARE_PROVIDER_SITE_OTHER): Payer: Medicare Other | Admitting: Family Medicine

## 2022-03-07 ENCOUNTER — Encounter: Payer: Self-pay | Admitting: Family Medicine

## 2022-03-07 VITALS — BP 133/57 | HR 67 | Ht 69.0 in | Wt 141.0 lb

## 2022-03-07 DIAGNOSIS — I10 Essential (primary) hypertension: Secondary | ICD-10-CM

## 2022-03-07 DIAGNOSIS — E559 Vitamin D deficiency, unspecified: Secondary | ICD-10-CM | POA: Diagnosis not present

## 2022-03-07 DIAGNOSIS — J439 Emphysema, unspecified: Secondary | ICD-10-CM

## 2022-03-07 DIAGNOSIS — R7989 Other specified abnormal findings of blood chemistry: Secondary | ICD-10-CM

## 2022-03-07 DIAGNOSIS — F4329 Adjustment disorder with other symptoms: Secondary | ICD-10-CM

## 2022-03-07 NOTE — Assessment & Plan Note (Signed)
Overall he feels like the Lexapro 5 mg has been helpful we discussed maybe increasing his dose but he wants to just stay at the 5 mg for now.  He is really having difficulty as his wife's dementia advances and she is now starting to have paranoid thoughts in particular about their neighbor. ?

## 2022-03-07 NOTE — Assessment & Plan Note (Addendum)
Follows with pulmonary.  They recently put him on Stiolto.  He has not filled the prescription at the pharmacy yet so hopefully will be covered.  We are also more than happy to help if needed. ?

## 2022-03-07 NOTE — Progress Notes (Signed)
? ?Established Patient Office Visit ? ?Subjective   ?Patient ID: Timothy Waller, male    DOB: 29-May-1934  Age: 88 y.o. MRN: [id number] ? ?Chief Complaint  ?Patient presents with  ? Hypertension  ? ? ?HPI ? ?Hypertension- Pt denies chest pain, SOB, dizziness, or heart palpitations.  Taking meds as directed w/o problems.  Denies medication side effects.   ? ?He also reports that he was encouraged to start vitamin D by his pulmonologist but he did have some questions about that as well.  He just saw them recently and they did make some adjustments to in his inhaler regimen.  The ones previously prescribed were too expensive so he really was not using them.  With his COPD he just notices he gets more tired easily. ? ? ? ? ?ROS ? ?  ?Objective:  ?  ? ?BP (!) 133/57   Pulse 67   Ht '5\' 9"'$  (1.753 m)   Wt 141 lb (64 kg)   SpO2 95%   BMI 20.82 kg/m?  ? ? ?Physical Exam ?Constitutional:   ?   Appearance: He is well-developed.  ?HENT:  ?   Head: Normocephalic and atraumatic.  ?Cardiovascular:  ?   Rate and Rhythm: Normal rate and regular rhythm.  ?   Heart sounds: Normal heart sounds.  ?Pulmonary:  ?   Effort: Pulmonary effort is normal.  ?   Breath sounds: Normal breath sounds.  ?Skin: ?   General: Skin is warm and dry.  ?Neurological:  ?   Mental Status: He is alert and oriented to person, place, and time.  ?Psychiatric:     ?   Behavior: Behavior normal.  ? ? ? ?No results found for any visits on 03/07/22. ? ? ? ?The ASCVD Risk score (Arnett DK, et al., 2019) failed to calculate for the following reasons: ?  The 2019 ASCVD risk score is only valid for ages 39 to 70 ? ?  ?Assessment & Plan:  ? ?Problem List Items Addressed This Visit   ? ?  ? Cardiovascular and Mediastinum  ? Essential hypertension (Chronic)  ?  Well controlled. Continue current regimen. Follow up in  6 mo  ? ?  ?  ?  ? Respiratory  ? COPD with emphysema ([place]) (Chronic)  ?  Follows with pulmonary.  They recently put him on Stiolto.  He has not filled the  prescription at the pharmacy yet so hopefully will be covered.  We are also more than happy to help if needed. ? ?  ?  ?  ? Other  ? Stress and adjustment reaction  ?  Overall he feels like the Lexapro 5 mg has been helpful we discussed maybe increasing his dose but he wants to just stay at the 5 mg for now.  He is really having difficulty as his wife's dementia advances and she is now starting to have paranoid thoughts in particular about their neighbor. ? ?  ?  ? ?Other Visit Diagnoses   ? ? Elevated serum creatinine    -  Primary  ? Vitamin D deficiency      ? ?  ? ?MND deficiency-we discussed recommendations for over-the-counter supplementation.  Or more than happy to follow this. ? ?Evaded serum creatinine at last office visit plan to recheck today. ? ?Also hemoglobin was a little borderline low at last office visit so we will check for iron deficiency as well. ? ?Return in about 6 months (around 09/07/2022) for mood/bp.  ? ? ?  Beatrice Lecher, MD ? ?

## 2022-03-07 NOTE — Assessment & Plan Note (Signed)
Well controlled. Continue current regimen. Follow up in  6 mo  

## 2022-03-08 ENCOUNTER — Encounter: Payer: Self-pay | Admitting: Family Medicine

## 2022-03-10 ENCOUNTER — Ambulatory Visit: Payer: Medicare Other | Admitting: Family Medicine

## 2022-03-24 ENCOUNTER — Telehealth: Payer: Self-pay | Admitting: Pulmonary Disease

## 2022-03-24 NOTE — Telephone Encounter (Signed)
Contacted patient by phone regarding referral from Dr. Valeta Harms for LCS program.  Explained to patient that he is not eligible due to age 88 years.  Will update Dr. Valeta Harms so he may continue care for future CT Chest scans, per his recommendation.  Referral closed and patient acknowledged understanding.

## 2022-04-05 DIAGNOSIS — R7989 Other specified abnormal findings of blood chemistry: Secondary | ICD-10-CM | POA: Diagnosis not present

## 2022-04-05 DIAGNOSIS — F4329 Adjustment disorder with other symptoms: Secondary | ICD-10-CM | POA: Diagnosis not present

## 2022-04-05 DIAGNOSIS — D649 Anemia, unspecified: Secondary | ICD-10-CM | POA: Diagnosis not present

## 2022-04-06 ENCOUNTER — Other Ambulatory Visit: Payer: Self-pay | Admitting: *Deleted

## 2022-04-06 DIAGNOSIS — R748 Abnormal levels of other serum enzymes: Secondary | ICD-10-CM

## 2022-04-06 DIAGNOSIS — R7989 Other specified abnormal findings of blood chemistry: Secondary | ICD-10-CM

## 2022-04-06 LAB — IRON,TIBC AND FERRITIN PANEL
%SAT: 19 % (calc) — ABNORMAL LOW (ref 20–48)
Ferritin: 31 ng/mL (ref 24–380)
Iron: 70 ug/dL (ref 50–180)
TIBC: 365 mcg/dL (calc) (ref 250–425)

## 2022-04-06 LAB — BASIC METABOLIC PANEL WITH GFR
BUN/Creatinine Ratio: 21 (calc) (ref 6–22)
BUN: 29 mg/dL — ABNORMAL HIGH (ref 7–25)
CO2: 24 mmol/L (ref 20–32)
Calcium: 9.3 mg/dL (ref 8.6–10.3)
Chloride: 103 mmol/L (ref 98–110)
Creat: 1.36 mg/dL — ABNORMAL HIGH (ref 0.70–1.22)
Glucose, Bld: 97 mg/dL (ref 65–99)
Potassium: 4.5 mmol/L (ref 3.5–5.3)
Sodium: 138 mmol/L (ref 135–146)
eGFR: 51 mL/min/{1.73_m2} — ABNORMAL LOW (ref >=60)

## 2022-04-06 NOTE — Progress Notes (Signed)
Hi Timothy Waller, kidney functions up slightly from 3 months ago it was 1.2 and it was 1.3 this time.  Though your kidney function has been as high as 1.6 a year ago.  I would like to get an an ultrasound of your kidneys just to check and make sure there are no concerning findings.  This may just be your new norm but I would like to make sure.  If you are okay with scheduling the ultrasound then please let me know.  Your iron is on the low end of normal.  So I would like for you to start a daily over-the-counter iron supplement just 1 tab daily and let see if we can move that number.  Like to recheck her iron in 3 months

## 2022-04-07 ENCOUNTER — Ambulatory Visit: Payer: Medicare Other | Admitting: Family Medicine

## 2022-04-11 ENCOUNTER — Ambulatory Visit (INDEPENDENT_AMBULATORY_CARE_PROVIDER_SITE_OTHER): Payer: Medicare Other

## 2022-04-11 DIAGNOSIS — R748 Abnormal levels of other serum enzymes: Secondary | ICD-10-CM | POA: Diagnosis not present

## 2022-04-11 DIAGNOSIS — N281 Cyst of kidney, acquired: Secondary | ICD-10-CM | POA: Diagnosis not present

## 2022-04-11 DIAGNOSIS — N200 Calculus of kidney: Secondary | ICD-10-CM | POA: Diagnosis not present

## 2022-04-11 DIAGNOSIS — R7989 Other specified abnormal findings of blood chemistry: Secondary | ICD-10-CM

## 2022-04-11 DIAGNOSIS — N400 Enlarged prostate without lower urinary tract symptoms: Secondary | ICD-10-CM | POA: Diagnosis not present

## 2022-04-12 ENCOUNTER — Encounter: Payer: Self-pay | Admitting: Physician Assistant

## 2022-04-12 DIAGNOSIS — N200 Calculus of kidney: Secondary | ICD-10-CM | POA: Insufficient documentation

## 2022-04-12 DIAGNOSIS — N281 Cyst of kidney, acquired: Secondary | ICD-10-CM | POA: Insufficient documentation

## 2022-04-12 NOTE — Progress Notes (Signed)
Small right kidney stone.  Complex cyst of mid right kidney Prostate enlarged  No obstruction but repeat renal u/s in 6 months to follow up.

## 2022-04-28 ENCOUNTER — Emergency Department (INDEPENDENT_AMBULATORY_CARE_PROVIDER_SITE_OTHER)
Admission: EM | Admit: 2022-04-28 | Discharge: 2022-04-28 | Disposition: A | Discharge status: Home / Self Care | Payer: Medicare Other | Source: Home / Self Care

## 2022-04-28 ENCOUNTER — Emergency Department (INDEPENDENT_AMBULATORY_CARE_PROVIDER_SITE_OTHER): Payer: Medicare Other

## 2022-04-28 ENCOUNTER — Encounter: Payer: Self-pay | Admitting: Emergency Medicine

## 2022-04-28 DIAGNOSIS — M7022 Olecranon bursitis, left elbow: Secondary | ICD-10-CM

## 2022-04-28 DIAGNOSIS — M25522 Pain in left elbow: Secondary | ICD-10-CM

## 2022-04-28 DIAGNOSIS — M7989 Other specified soft tissue disorders: Secondary | ICD-10-CM | POA: Diagnosis not present

## 2022-04-28 MED ORDER — AMOXICILLIN-POT CLAVULANATE 875-125 MG PO TABS: 1.0000 | ORAL_TABLET | Freq: Two times a day (BID) | ORAL | 0 refills | Status: AC

## 2022-04-28 MED ORDER — PREDNISONE 10 MG (21) PO TBPK: ORAL_TABLET | Freq: Every day | ORAL | 0 refills | Status: DC

## 2022-04-28 NOTE — Discharge Instructions (Addendum)
Instructed patient to take medication as directed with food to completion.  Advised patient to take prednisone with first dose of Augmentin for the next 10 days.  Encouraged patient to increase daily water intake while taking this medication.  Advised patient if symptoms worsen and/or unresolved please follow-up with [facility] health orthopedic provider for further evaluation.  Contact information for this provider is below.

## 2022-04-28 NOTE — ED Triage Notes (Signed)
Patient states that he hit his left elbow a couple of days ago, got up this morning to his left elbow red, swollen and tender to touch.  Patient has taken Ibuprofen.

## 2022-04-28 NOTE — ED Provider Notes (Signed)
Tim[facility] Wall[facility]    CSN: [id number] Arrival date & time: 04/28/22  1351      History   Chief Complaint Chief Complaint  Patient presents with   Joint Swelling    HPI Timothy Waller is a 88 y.o. male.   HPI Very pleasant 88 year old male presents with left elbow pain and swelling.  Reports bumping left elbow roughly 7 to 10 days ago when getting out of his car.  Patient reports redness and swelling has increased over this time period.  PMH significant for HTN, COPD, and hyperlipidemia.  Past Medical History:  Diagnosis Date   Abnormal weight loss    COPD (chronic obstructive pulmonary disease) ([place])    Hyperlipidemia    Osteoporosis 09/01/2010   Qualifier: Diagnosis of  By: Renae Fickle CMA, Deborra Medina), Andrea      Patient Active Problem List   Diagnosis Date Noted   Acquired renal cyst of right kidney 04/12/2022   Right kidney stone 04/12/2022   Stress and adjustment reaction 12/08/2021   Lung nodule 11/29/2020   Pleural effusion 11/29/2020   Cachexia ([place]) 11/29/2020   Centrilobular emphysema ([place]) 11/29/2020   Empyema ([place]) 11/25/2020   Benign localized prostatic hyperplasia with lower urinary tract symptoms (LUTS) 08/25/2020   Skin lesion of face 10/29/2019   Lightheadedness 10/29/2019   Proteinuria 01/31/2018   Frequent urination 01/31/2018   Orthostasis 06/12/2017   Heavy tobacco smoker 06/12/2017   Benign colonic polyp 11/02/2016   Osteopenia 02/17/2016   Essential hypertension 02/09/2016   Special screening for malignant neoplasms, colon 03/05/2015   Hyperlipidemia 12/16/2014   COPD with emphysema ([place]) 12/15/2014   SHOULDER PAIN 08/24/2010   HEMATURIA UNSPECIFIED 06/11/2009   ERECTILE DYSFUNCTION, ORGANIC 06/11/2009   SEBORRHEIC KERATOSIS 06/11/2009   LUMBAGO 06/11/2009    Past Surgical History:  Procedure Laterality Date   COLONOSCOPY     INNER EAR SURGERY     hole in eardrum 30 years ago       Home Medications    Prior to Admission  medications   Medication Sig Start Date End Date Taking? Authorizing Provider  amLODipine (NORVASC) 10 MG tablet Take 1 tablet (10 mg total) by mouth daily. 09/10/21  Yes Luetta Nutting, DO  amoxicillin-clavulanate (AUGMENTIN) 875-125 MG tablet Take 1 tablet by mouth 2 (two) times daily for 10 days. 04/28/22 05/08/22 Yes Eliezer Lofts, FNP  Ascorbic Acid (VITAMIN C) 1000 MG tablet Take 1,000 mg by mouth daily.   Yes [provider]  atorvastatin (LIPITOR) 10 MG tablet TAKE 1 TABLET BY MOUTH EVERY DAY/LABS FOR REFILLS 08/05/21  Yes Luetta Nutting, DO  escitalopram (LEXAPRO) 5 MG tablet Take 1 tablet (5 mg total) by mouth daily. 01/03/22  Yes Hali Marry, MD  fluticasone (FLONASE) 50 MCG/ACT nasal spray Place 2 sprays into both nostrils daily. 11/16/21  Yes Luetta Nutting, DO  Multiple Vitamins-Minerals (CENTRUM PO) Take 1 tablet by mouth daily.   Yes [provider]  predniSONE (STERAPRED UNI-PAK 21 TAB) 10 MG (21) TBPK tablet Take by mouth daily. Take 6 tabs by mouth daily  for 2 days, then 5 tabs for 2 days, then 4 tabs for 2 days, then 3 tabs for 2 days, 2 tabs for 2 days, then 1 tab by mouth daily for 2 days 04/28/22  Yes Eliezer Lofts, FNP  Salicylic Acid 3 % SHAM Apply 1 application topically 3 (three) times a week. 03/10/21  Yes Emeterio Reeve, DO  Tiotropium Bromide-Olodaterol (STIOLTO RESPIMAT) 2.5-2.5 MCG/ACT AERS Inhale 2  puffs into the lungs daily. 03/02/22  Yes Icard, Octavio Graves, DO    Family History Family History  Problem Relation Age of Onset   Cancer Father        mouth cancer     Social History Social History   Tobacco Use   Smoking status: Former    Packs/day: 0.50    Types: Cigarettes    Quit date: 10/24/2020    Years since quitting: 1.5   Smokeless tobacco: Never  Vaping Use   Vaping Use: Never used  Substance Use Topics   Alcohol use: Yes    Alcohol/week: 0.0 standard drinks of alcohol    Comment: 2-3 drinks per week    Drug use: No      Allergies   Patient has no known allergies.   Review of Systems Review of Systems  Musculoskeletal:        Left elbow pain, redness, and swelling 7 to 10 days  All other systems reviewed and are negative.    Physical Exam Triage Vital Signs ED Triage Vitals  Enc Vitals Group     BP 04/28/22 1403 (!) 126/56     Pulse Rate 04/28/22 1403 78     Resp 04/28/22 1403 18     Temp 04/28/22 1403 98.1 F (36.7 C)     Temp Source 04/28/22 1403 Oral     SpO2 04/28/22 1403 93 %     Weight 04/28/22 1405 141 lb (64 kg)     Height 04/28/22 1405 '5\' 9"'$  (1.753 m)     Head Circumference --      Peak Flow --      Pain Score 04/28/22 1404 5     Pain Loc --      Pain Edu? --      Excl. in [place]? --    No data found.  Updated Vital Signs BP (!) 126/56 (BP Location: Right Arm)   Pulse 78   Temp 98.1 F (36.7 C) (Oral)   Resp 18   Ht '5\' 9"'$  (1.753 m)   Wt 141 lb (64 kg)   SpO2 93%   BMI 20.82 kg/m    Physical Exam Vitals and nursing note reviewed.  Constitutional:      General: He is not in acute distress.    Appearance: Normal appearance. He is normal weight. He is not ill-appearing.  HENT:     Head: Normocephalic and atraumatic.     Mouth/Throat:     Mouth: Mucous membranes are moist.     Pharynx: Oropharynx is clear.  Eyes:     Extraocular Movements: Extraocular movements intact.     Conjunctiva/sclera: Conjunctivae normal.     Pupils: Pupils are equal, round, and reactive to light.  Cardiovascular:     Rate and Rhythm: Normal rate and regular rhythm.     Pulses: Normal pulses.     Heart sounds: Normal heart sounds.  Pulmonary:     Effort: Pulmonary effort is normal.     Breath sounds: Normal breath sounds. No wheezing, rhonchi or rales.  Musculoskeletal:     Cervical back: Normal range of motion and neck supple.     Comments: Left elbow (lateral aspect of olecranon): TTP, erythematous, with moderate soft tissue swelling noted  Skin:    General: Skin is warm and  dry.  Neurological:     General: No focal deficit present.     Mental Status: He is alert and oriented to person, place, and time.  UC Treatments / Results  Labs (all labs ordered are listed, but only abnormal results are displayed) Labs Reviewed - No data to display  EKG   Radiology DG Elbow Complete Left  Result Date: 04/28/2022 CLINICAL DATA:  Pain EXAM: LEFT ELBOW - COMPLETE 3 VIEW COMPARISON:  None Available. FINDINGS: There is no evidence of fracture, dislocation, or joint effusion. There is no evidence of arthropathy or other focal bone abnormality. Marked elbow soft tissue swelling overlying the olecranon process. IMPRESSION: 1. No acute osseous abnormality. 2. Soft tissue swelling. Electronically Signed   By: Yetta Glassman M.D.   On: 04/28/2022 15:09    Procedures Procedures (including critical care time)  Medications Ordered in UC Medications - No data to display  Initial Impression / Assessment and Plan / UC Course  I have reviewed the triage vital signs and the nursing notes.  Pertinent labs & imaging results that were available during my care of the patient were reviewed by me and considered in my medical decision making (see chart for details).     MDM: 1.  Olecranon bursitis of left elbow-left elbow x-ray revealed above, Rx'd Augmentin, Sterapred Unipak. Instructed patient to take medication as directed with food to completion.  Advised patient to take prednisone with first dose of Augmentin for the next 10 days.  Encouraged patient to increase daily water intake while taking this medication.  Advised patient if symptoms worsen and/or unresolved please follow-up with [facility] health orthopedic provider for further evaluation.  Contact information for this provider is below.   Final Clinical Impressions(s) / UC Diagnoses   Final diagnoses:  Olecranon bursitis of left elbow     Discharge Instructions      Instructed patient to take medication as directed  with food to completion.  Advised patient to take prednisone with first dose of Augmentin for the next 10 days.  Encouraged patient to increase daily water intake while taking this medication.  Advised patient if symptoms worsen and/or unresolved please follow-up with [facility] health orthopedic provider for further evaluation.  Contact information for this provider is below.     ED Prescriptions     Medication Sig Dispense Auth. Provider   amoxicillin-clavulanate (AUGMENTIN) 875-125 MG tablet Take 1 tablet by mouth 2 (two) times daily for 10 days. 20 tablet Eliezer Lofts, FNP   predniSONE (STERAPRED UNI-PAK 21 TAB) 10 MG (21) TBPK tablet Take by mouth daily. Take 6 tabs by mouth daily  for 2 days, then 5 tabs for 2 days, then 4 tabs for 2 days, then 3 tabs for 2 days, 2 tabs for 2 days, then 1 tab by mouth daily for 2 days 42 tablet Eliezer Lofts, FNP      PDMP not reviewed this encounter.   Baden, [place], [place] 04/28/22 [phone]

## 2022-05-02 ENCOUNTER — Encounter: Payer: Self-pay | Admitting: Family Medicine

## 2022-05-15 ENCOUNTER — Encounter: Payer: Self-pay | Admitting: Family Medicine

## 2022-05-16 ENCOUNTER — Encounter: Payer: Self-pay | Admitting: Family Medicine

## 2022-05-16 ENCOUNTER — Ambulatory Visit (INDEPENDENT_AMBULATORY_CARE_PROVIDER_SITE_OTHER): Payer: Medicare Other | Admitting: Family Medicine

## 2022-05-16 VITALS — BP 126/53 | HR 66 | Wt 137.0 lb

## 2022-05-16 DIAGNOSIS — M7022 Olecranon bursitis, left elbow: Secondary | ICD-10-CM | POA: Diagnosis not present

## 2022-05-16 NOTE — Progress Notes (Signed)
   Established Patient Office Visit  Subjective   Patient ID: Timothy Waller, male    DOB: 27-Jan-1934  Age: 88 y.o. MRN: [id number]  Chief Complaint  Patient presents with   Follow-up    Olecranon bursitis of left elbow pt was seen in the ED on 7/6 for this    HPI  Olecranon bursitis of left elbow pt was seen in the ED on 7/6 for this. Given ABS and prednisone and has finished them.       ROS    Objective:     BP (!) 126/53   Pulse 66   Wt 137 lb (62.1 kg)   SpO2 93%   BMI 20.23 kg/m     Physical Exam   No results found for any visits on 05/16/22.    The ASCVD Risk score (Arnett DK, et al., 2019) failed to calculate for the following reasons:   The 2019 ASCVD risk score is only valid for ages 30 to 70    Assessment & Plan:   Problem List Items Addressed This Visit   None Visit Diagnoses     Olecranon bursitis, left elbow    -  Primary      Liken and bursitis of the left elbow-he really has not had any improvement in the swelling in over 2-1/2 weeks.  So we discussed options including draining the bursa.  We also discussed the increased risk of infection by poking a hole in the bursa.  Bursa Aspiration without Injection Procedure Note  Pre-operative Diagnosis: left Olecranon bursitis  Post-operative Diagnosis: same  Indications: Diagnosis and treatment of symptomatic bursal effusion  Anesthesia: Lidocaine 1% with epinephrine without added sodium bicarbonate  Procedure Details   After a discussion of the risks and benefits with the patient (including the possibility that any manipulation of the bursa could introduce infection, worsening the current situation significantly), verbal consent was obtained for the procedure. The joint was prepped with Betadine and a small wheel of local anesthesia was injected into the subcutaneous tissue. An 18 gauge needle was introduced into the fluid collection and 7 ml of clear yellow fluid was removed and discarded. No  corticosteroids were given. The injection site was cleansed with topical isopropyl alcohol and a dressing was applied.  Complications:  None; patient tolerated the procedure well.     No follow-ups on file.    Beatrice Lecher, MD

## 2022-05-16 NOTE — Patient Instructions (Signed)
Keep wound covered in the shower with a waterproof bandage.  Remove the bandage after the shower pat dry and apply a small amount of gauze for any drainage and then apply your Ace wrap for compression.  You will need to wear the ACE wrap for a week.

## 2022-05-16 NOTE — Telephone Encounter (Signed)
Scheduled patient for this afternoon with PCP. AMUCK

## 2022-06-13 ENCOUNTER — Ambulatory Visit
Admission: EM | Admit: 2022-06-13 | Discharge: 2022-06-13 | Disposition: A | Discharge status: Home / Self Care | Payer: Medicare Other

## 2022-06-13 DIAGNOSIS — H6123 Impacted cerumen, bilateral: Secondary | ICD-10-CM

## 2022-06-13 NOTE — ED Provider Notes (Signed)
[facility]    CSN: [id number] Arrival date & time: 06/13/22  0801      History   Chief Complaint Chief Complaint  Patient presents with   Ear Fullness    HPI Timothy Waller is a 88 y.o. male.   HPI Very pleasant 88 year old male presents with bilateral ear fullness for 2 days.  PMH significant for COPD, HLD, and history of nephrolithiasis.  Past Medical History:  Diagnosis Date   Abnormal weight loss    COPD (chronic obstructive pulmonary disease) ([place])    Hyperlipidemia    Osteoporosis 09/01/2010   Qualifier: Diagnosis of  By: Renae Fickle CMA, Deborra Medina), Andrea      Patient Active Problem List   Diagnosis Date Noted   Acquired renal cyst of right kidney 04/12/2022   Right kidney stone 04/12/2022   Stress and adjustment reaction 12/08/2021   Lung nodule 11/29/2020   Pleural effusion 11/29/2020   Cachexia ([place]) 11/29/2020   Centrilobular emphysema ([place]) 11/29/2020   Empyema ([place]) 11/25/2020   Benign localized prostatic hyperplasia with lower urinary tract symptoms (LUTS) 08/25/2020   Skin lesion of face 10/29/2019   Lightheadedness 10/29/2019   Proteinuria 01/31/2018   Frequent urination 01/31/2018   Orthostasis 06/12/2017   Heavy tobacco smoker 06/12/2017   Benign colonic polyp 11/02/2016   Osteopenia 02/17/2016   Essential hypertension 02/09/2016   Special screening for malignant neoplasms, colon 03/05/2015   Hyperlipidemia 12/16/2014   COPD with emphysema ([place]) 12/15/2014   SHOULDER PAIN 08/24/2010   HEMATURIA UNSPECIFIED 06/11/2009   ERECTILE DYSFUNCTION, ORGANIC 06/11/2009   SEBORRHEIC KERATOSIS 06/11/2009   LUMBAGO 06/11/2009    Past Surgical History:  Procedure Laterality Date   COLONOSCOPY     INNER EAR SURGERY     hole in eardrum 30 years ago       Home Medications    Prior to Admission medications   Medication Sig Start Date End Date Taking? Authorizing Provider  amLODipine (NORVASC) 10 MG tablet Take 1 tablet (10 mg total) by  mouth daily. 09/10/21   Luetta Nutting, DO  Ascorbic Acid (VITAMIN C) 1000 MG tablet Take 1,000 mg by mouth daily.    [provider]  atorvastatin (LIPITOR) 10 MG tablet TAKE 1 TABLET BY MOUTH EVERY DAY/LABS FOR REFILLS 08/05/21   Luetta Nutting, DO  escitalopram (LEXAPRO) 5 MG tablet Take 1 tablet (5 mg total) by mouth daily. 01/03/22   Hali Marry, MD  fluticasone (FLONASE) 50 MCG/ACT nasal spray Place 2 sprays into both nostrils daily. 11/16/21   Luetta Nutting, DO  Multiple Vitamins-Minerals (CENTRUM PO) Take 1 tablet by mouth daily.    [provider]  Salicylic Acid 3 % SHAM Apply 1 application topically 3 (three) times a week. 03/10/21   Emeterio Reeve, DO  Tiotropium Bromide-Olodaterol (STIOLTO RESPIMAT) 2.5-2.5 MCG/ACT AERS Inhale 2 puffs into the lungs daily. 03/02/22   Garner Nash, DO    Family History Family History  Problem Relation Age of Onset   Cancer Father        mouth cancer     Social History Social History   Tobacco Use   Smoking status: Former    Packs/day: 0.50    Types: Cigarettes    Quit date: 10/24/2020    Years since quitting: 1.6   Smokeless tobacco: Never  Vaping Use   Vaping Use: Never used  Substance Use Topics   Alcohol use: Yes    Alcohol/week: 0.0 standard drinks of alcohol  Comment: 2-3 drinks per week    Drug use: No     Allergies   Patient has no known allergies.   Review of Systems Review of Systems  HENT:         Bilateral ear fullness x 2 days  All other systems reviewed and are negative.    Physical Exam Triage Vital Signs ED Triage Vitals  Enc Vitals Group     BP 06/13/22 0809 (!) 169/79     Pulse Rate 06/13/22 0809 68     Resp 06/13/22 0809 14     Temp 06/13/22 0809 97.6 F (36.4 C)     Temp Source 06/13/22 0809 Oral     SpO2 06/13/22 0809 96 %     Weight --      Height --      Head Circumference --      Peak Flow --      Pain Score 06/13/22 0811 0     Pain Loc --      Pain  Edu? --      Excl. in [place]? --    No data found.  Updated Vital Signs BP (!) 169/79 (BP Location: Right Arm)   Pulse 68   Temp 97.6 F (36.4 C) (Oral)   Resp 14   SpO2 96%    Physical Exam Vitals and nursing note reviewed.  Constitutional:      General: He is not in acute distress.    Appearance: Normal appearance. He is normal weight. He is not ill-appearing.  HENT:     Head: Normocephalic and atraumatic.     Right Ear: External ear normal.     Left Ear: External ear normal.     Ears:     Comments: Bilateral EAC's occluded with cerumen unable to visualize either TM; post bilateral ear lavage: EAC's are clear; bilateral TM's are clear slightly retracted    Mouth/Throat:     Mouth: Mucous membranes are moist.     Pharynx: Oropharynx is clear.  Eyes:     Extraocular Movements: Extraocular movements intact.     Conjunctiva/sclera: Conjunctivae normal.     Pupils: Pupils are equal, round, and reactive to light.  Cardiovascular:     Rate and Rhythm: Normal rate and regular rhythm.     Pulses: Normal pulses.     Heart sounds: Normal heart sounds. No murmur heard. Pulmonary:     Effort: Pulmonary effort is normal.     Breath sounds: Normal breath sounds. No wheezing, rhonchi or rales.  Musculoskeletal:        General: Normal range of motion.     Cervical back: Normal range of motion and neck supple.  Skin:    General: Skin is warm and dry.  Neurological:     General: No focal deficit present.     Mental Status: He is alert and oriented to person, place, and time.      UC Treatments / Results  Labs (all labs ordered are listed, but only abnormal results are displayed) Labs Reviewed - No data to display  EKG   Radiology No results found.  Procedures Procedures (including critical care time)  Medications Ordered in UC Medications - No data to display  Initial Impression / Assessment and Plan / UC Course  I have reviewed the triage vital signs and the nursing  notes.  Pertinent labs & imaging results that were available during my care of the patient were reviewed by me and considered in my  medical decision making (see chart for details).     MDM: 1.  Bilateral cerumen impaction-resolved with bilateral ear lavage. Advised patient if ear fullness returns please follow-up with PCP or here for further evaluation.  Patient discharged home, hemodynamically stable. Final Clinical Impressions(s) / UC Diagnoses   Final diagnoses:  Bilateral impacted cerumen     Discharge Instructions      Advised patient if ear fullness returns please follow-up with PCP or here for further evaluation.     ED Prescriptions   None    PDMP not reviewed this encounter.   Viral, [place], [place] 06/13/22 [phone]

## 2022-06-13 NOTE — ED Triage Notes (Signed)
Pt presents with bilateral ear fullness.

## 2022-06-13 NOTE — Discharge Instructions (Addendum)
Advised patient if ear fullness returns please follow-up with PCP or here for further evaluation.

## 2022-06-15 ENCOUNTER — Encounter: Payer: Self-pay | Admitting: General Practice

## 2022-06-16 ENCOUNTER — Encounter: Payer: Self-pay | Admitting: Pulmonary Disease

## 2022-06-16 NOTE — Telephone Encounter (Signed)
Dr. Valeta Harms, please advise.   Dr. Valeta Harms, my dad was wondering if you are recommending the RSV vaccine for your older patients such as him with COPD? My mom is not a patient of yours but she's my dad's age and does not have COPD. Just wondering what your recommendations are? Thank you! [name]    Message routed to Dr. Valeta Harms

## 2022-06-20 DIAGNOSIS — H25813 Combined forms of age-related cataract, bilateral: Secondary | ICD-10-CM | POA: Diagnosis not present

## 2022-06-20 DIAGNOSIS — H524 Presbyopia: Secondary | ICD-10-CM | POA: Diagnosis not present

## 2022-06-20 DIAGNOSIS — H5213 Myopia, bilateral: Secondary | ICD-10-CM | POA: Diagnosis not present

## 2022-06-20 DIAGNOSIS — H52223 Regular astigmatism, bilateral: Secondary | ICD-10-CM | POA: Diagnosis not present

## 2022-06-20 DIAGNOSIS — H04123 Dry eye syndrome of bilateral lacrimal glands: Secondary | ICD-10-CM | POA: Diagnosis not present

## 2022-07-05 ENCOUNTER — Ambulatory Visit (INDEPENDENT_AMBULATORY_CARE_PROVIDER_SITE_OTHER): Payer: Medicare Other | Admitting: Family Medicine

## 2022-07-05 DIAGNOSIS — Z23 Encounter for immunization: Secondary | ICD-10-CM

## 2022-08-13 IMAGING — US US RENAL
1 series · 14 of 25 positions shown · non-contrast
Comparison: CT 11/23/2020

CLINICAL DATA: Elevated creatinine

EXAM:
RENAL / URINARY TRACT ULTRASOUND COMPLETE

[Series 1: us renal · 14 of 54 slices shown]
[im 1/54]
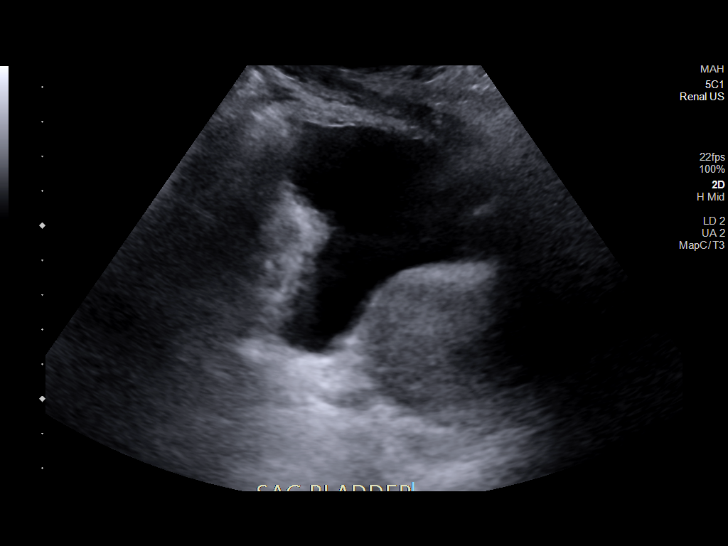
[im 5/54]
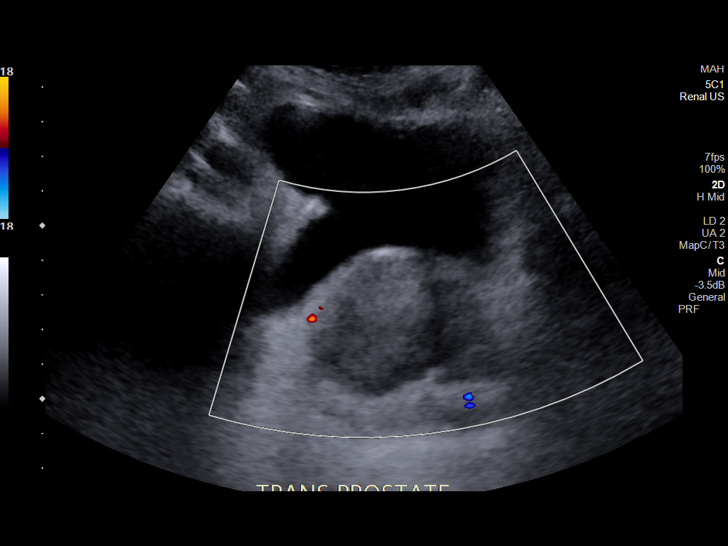
[im 9/54]
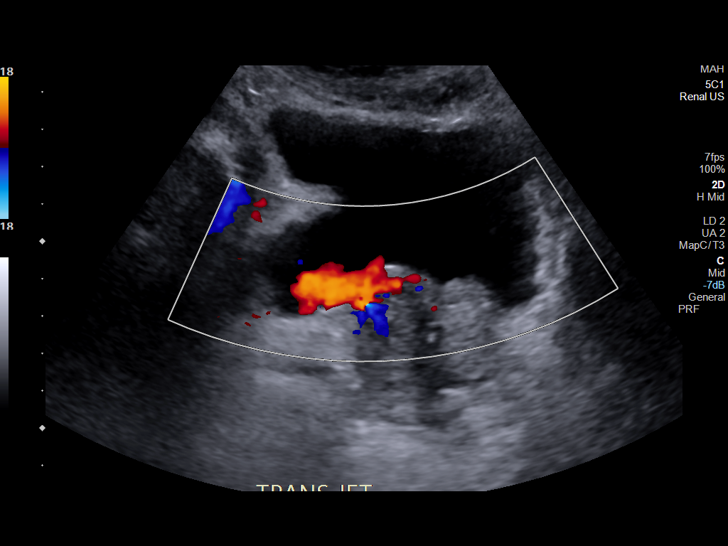
[im 14/54]
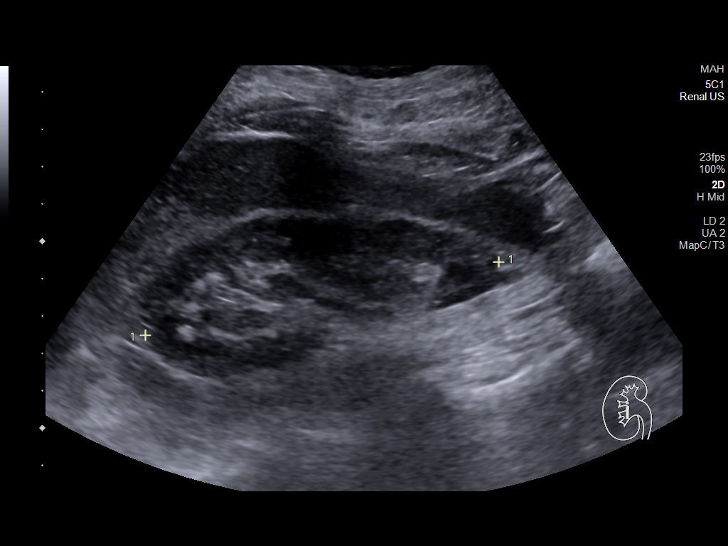
[im 18/54]
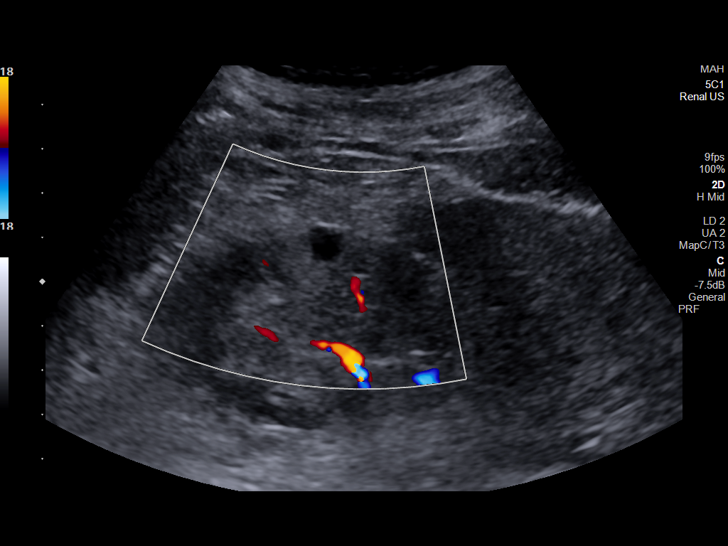
[im 20/54]
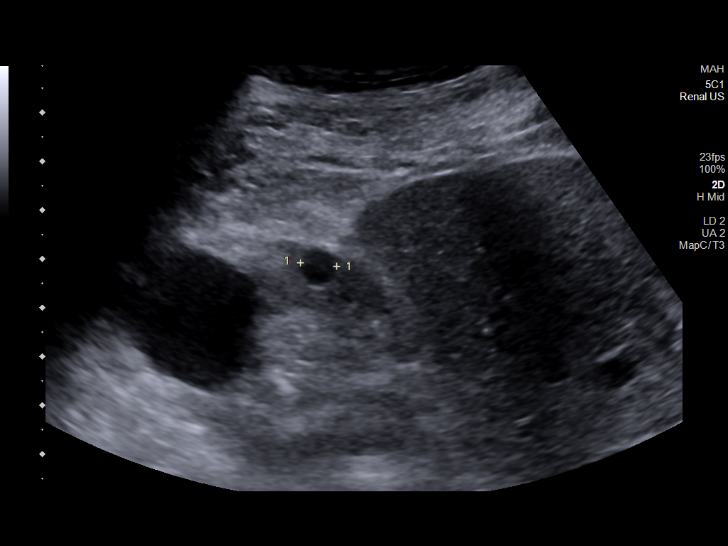
[im 25/54]
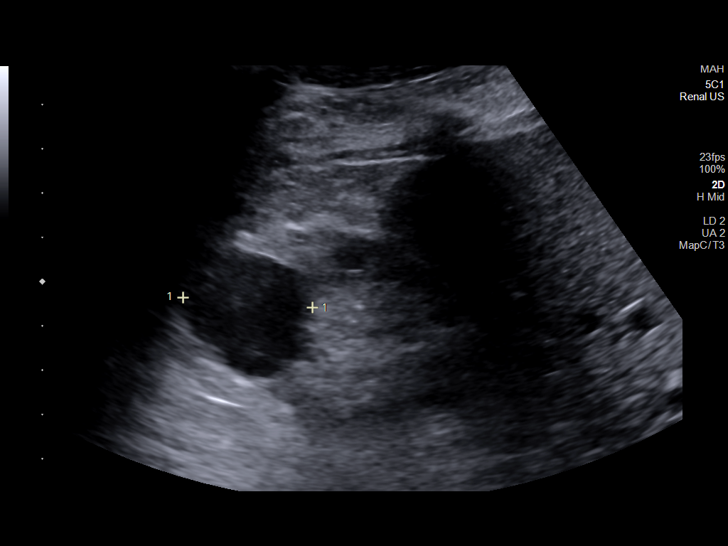
[im 29/54]
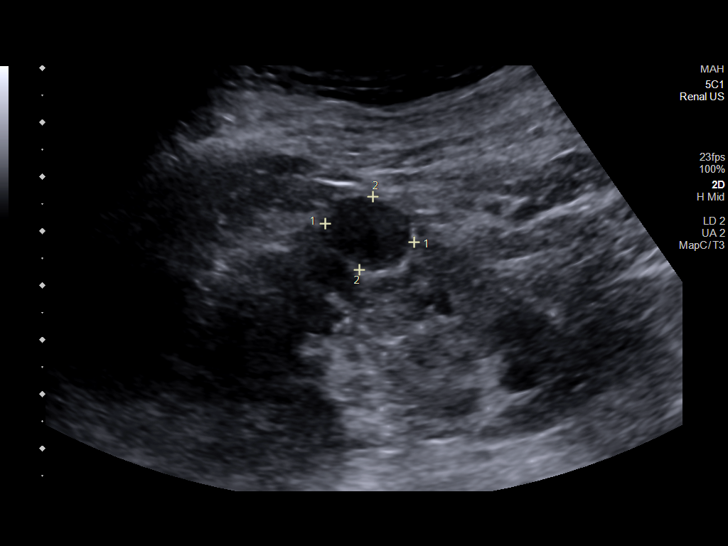
[im 34/54]
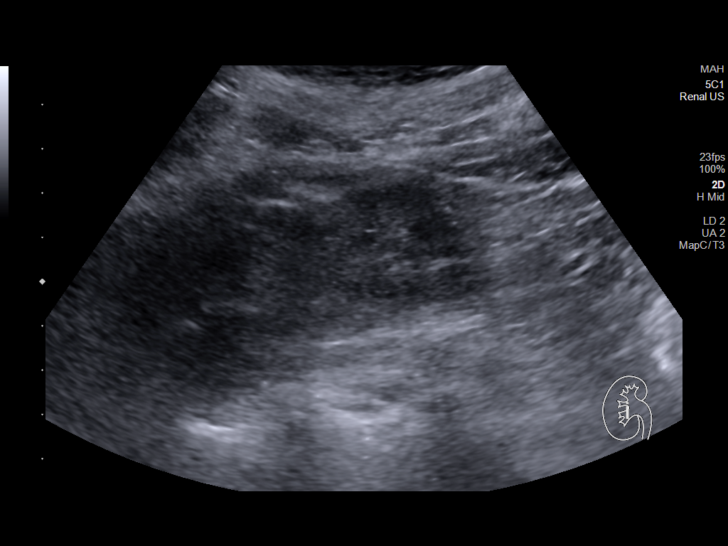
[im 36/54]
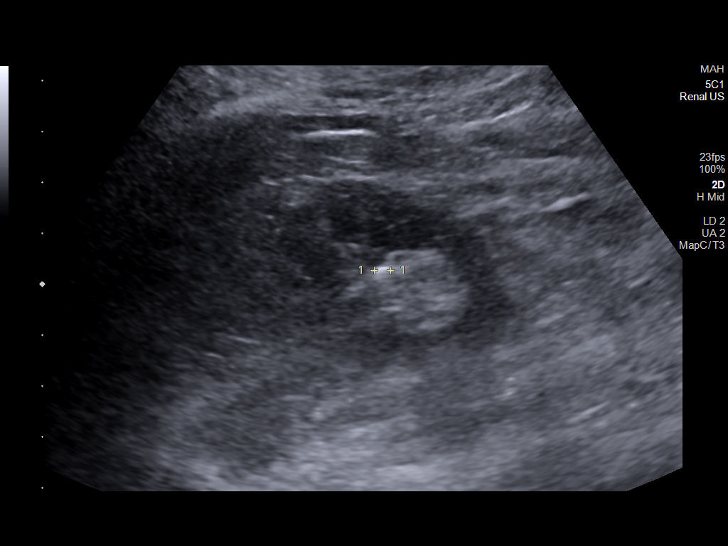
[im 40/54]
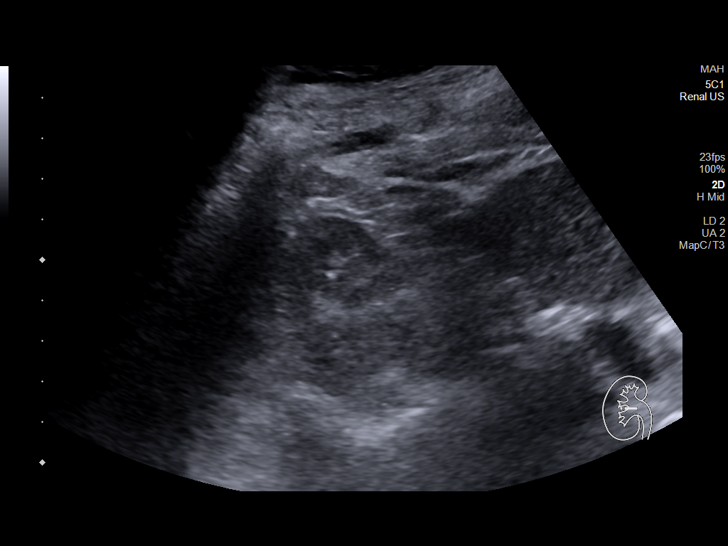
[im 45/54]
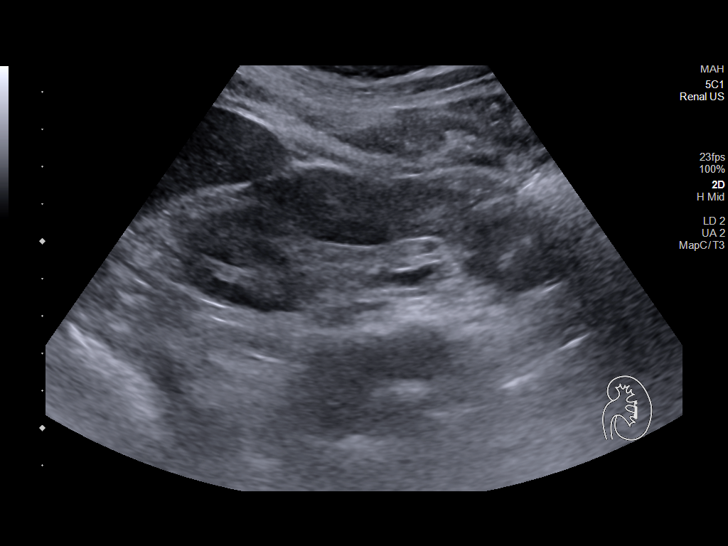
[im 49/54]
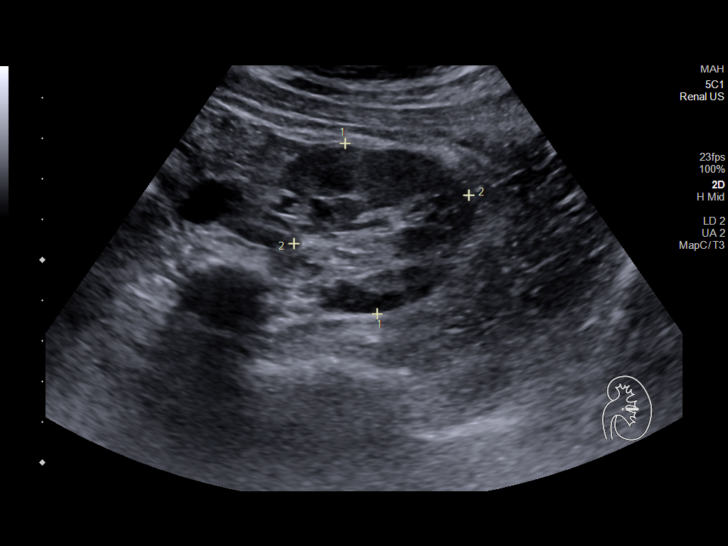
[im 54/54]
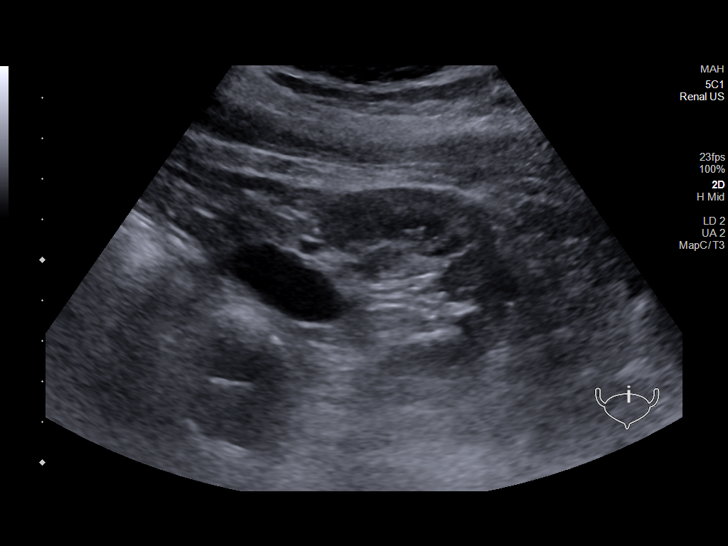

[14 of 25 positions shown; findings below may reference images not displayed]

FINDINGS: Right Kidney:

Renal measurements: 9.7 x 4.2 x 5.1 cm = volume: 108.1 mL.
Echogenicity within normal limits. No hydronephrosis. Echogenic
focus lower pole measuring 3 mm, possible stone. Multiple cysts in
the right kidney. The largest is seen in the upper pole and measures
2.9 cm. Complex cyst mid pole measures 17 x 14 x 15 mm

Left Kidney:

Renal measurements: 11.4 x 4.3 x 4.5 cm = volume: 114.4 mL.
Echogenicity within normal limits. Enlarged extrarenal left pelvis
but no calyceal dilatation.

Bladder:

Appears normal for degree of bladder distention.

Other:

Enlarged prostate with lobulated contour measuring up to 5.3 cm.
IMPRESSION: 1. Negative for hydronephrosis.
2. Small right kidney stone. Slightly complex appearing cyst midpole
right kidney measuring up to 17 mm, recommend 6 month sonographic
follow-up.
3. Enlarged prostate with lobulated contour.

## 2022-09-07 ENCOUNTER — Ambulatory Visit: Payer: Medicare Other | Admitting: Family Medicine

## 2022-09-15 ENCOUNTER — Other Ambulatory Visit: Payer: Self-pay | Admitting: Family Medicine

## 2022-11-02 ENCOUNTER — Encounter: Payer: Self-pay | Admitting: Family Medicine

## 2022-12-19 ENCOUNTER — Encounter: Payer: Self-pay | Admitting: Family Medicine

## 2022-12-19 MED ORDER — STIOLTO RESPIMAT 2.5-2.5 MCG/ACT IN AERS: 2.0000 | INHALATION_SPRAY | Freq: Every day | RESPIRATORY_TRACT | 5 refills | Status: DC

## 2022-12-29 ENCOUNTER — Other Ambulatory Visit: Payer: Self-pay | Admitting: Family Medicine

## 2022-12-29 DIAGNOSIS — N281 Cyst of kidney, acquired: Secondary | ICD-10-CM

## 2022-12-29 NOTE — Telephone Encounter (Signed)
Patient scheduled for 01/16/2023 at 7:50, thanks.

## 2022-12-29 NOTE — Telephone Encounter (Signed)
Plz call pt to schedule a f/u on bp,cholesterol and fasting labs

## 2023-01-16 ENCOUNTER — Telehealth: Payer: Self-pay | Admitting: Family Medicine

## 2023-01-16 ENCOUNTER — Encounter: Payer: Self-pay | Admitting: Family Medicine

## 2023-01-16 ENCOUNTER — Ambulatory Visit (INDEPENDENT_AMBULATORY_CARE_PROVIDER_SITE_OTHER): Payer: Medicare Other

## 2023-01-16 ENCOUNTER — Ambulatory Visit (INDEPENDENT_AMBULATORY_CARE_PROVIDER_SITE_OTHER): Payer: Medicare Other | Admitting: Family Medicine

## 2023-01-16 VITALS — BP 132/64 | HR 72 | Ht 68.0 in | Wt 128.0 lb

## 2023-01-16 DIAGNOSIS — E559 Vitamin D deficiency, unspecified: Secondary | ICD-10-CM | POA: Diagnosis not present

## 2023-01-16 DIAGNOSIS — I10 Essential (primary) hypertension: Secondary | ICD-10-CM | POA: Diagnosis not present

## 2023-01-16 DIAGNOSIS — R972 Elevated prostate specific antigen [PSA]: Secondary | ICD-10-CM

## 2023-01-16 DIAGNOSIS — R7989 Other specified abnormal findings of blood chemistry: Secondary | ICD-10-CM

## 2023-01-16 DIAGNOSIS — J432 Centrilobular emphysema: Secondary | ICD-10-CM | POA: Diagnosis not present

## 2023-01-16 DIAGNOSIS — L570 Actinic keratosis: Secondary | ICD-10-CM | POA: Diagnosis not present

## 2023-01-16 DIAGNOSIS — R058 Other specified cough: Secondary | ICD-10-CM | POA: Diagnosis not present

## 2023-01-16 DIAGNOSIS — R64 Cachexia: Secondary | ICD-10-CM

## 2023-01-16 DIAGNOSIS — J439 Emphysema, unspecified: Secondary | ICD-10-CM

## 2023-01-16 DIAGNOSIS — R059 Cough, unspecified: Secondary | ICD-10-CM | POA: Diagnosis not present

## 2023-01-16 MED ORDER — ANORO ELLIPTA 62.5-25 MCG/ACT IN AEPB: 1.0000 | INHALATION_SPRAY | Freq: Every morning | RESPIRATORY_TRACT | 5 refills | Status: DC

## 2023-01-16 NOTE — Assessment & Plan Note (Signed)
And concerned that he continues to lose weight.  Working to start with a plain film chest x-ray and get some updated labs today.  Consider CT and up-to-date spirometry for further evaluation as well.

## 2023-01-16 NOTE — Telephone Encounter (Signed)
Called patient to schedule Medicare Annual Wellness Visit (AWV). No voicemail available to leave a message.  Last date of AWV: 03/23/2021  Please schedule an appointment at any time with [facility].  If any questions, please contact me at [phone].  Thank you ,  Lin Givens Patient Access Advocate II Direct Dial: [phone]

## 2023-01-16 NOTE — Assessment & Plan Note (Signed)
It looks like Anoro might be better covered than the Stiolto.  I will send over new prescription and have the pharmacist run it if it still expensive encouraged him to check with his insurance to see what might be better covered.

## 2023-01-16 NOTE — Assessment & Plan Note (Addendum)
I am concerned that he is having increased cough and fatigue and he has also lost 6 pounds since I last saw him.  Did encourage him to try to increase his protein intake and make sure he is getting in enough calories.  Consider getting updated spirometry as well and possibly doing a walk test when he comes back in 3 to 4 weeks.  It may be that his emphysema is progressing.

## 2023-01-16 NOTE — Progress Notes (Signed)
Established Patient Office Visit  Subjective   Patient ID: Timothy Waller, male    DOB: Sep 15, 1934  Age: 89 y.o. MRN: [id number]  Chief Complaint  Patient presents with   Hypertension   Hyperlipidemia    HPI  Hypertension- Pt denies chest pain, SOB, dizziness, or heart palpitations.  Taking meds as directed w/o problems.  Denies medication side effects.     Hyperlipidemia - tolerating stating well with no myalgias or significant side effects.  Lab Results  Component Value Date   CHOL 188 12/08/2021   HDL 108 12/08/2021   LDLCALC 66 12/08/2021   TRIG 55 12/08/2021   CHOLHDL 1.7 12/08/2021    F/U COPD -  the Stiolto is over $100 and too expensive. Has been coughing more and he is lost more weight.  Cough is occasionally productive but not often.  He just notices he gets more tired with activity.  He also has a new skin lesion on his forehead he would like me to look at today it is very raised.  ROS    Objective:     BP 132/64   Pulse 72   Ht 5\' 8"  (1.727 m)   Wt 128 lb (58.1 kg)   SpO2 95%   BMI 19.46 kg/m     Physical Exam Constitutional:      Appearance: He is well-developed.  HENT:     Head: Normocephalic and atraumatic.     Right Ear: External ear normal.     Left Ear: External ear normal.  Cardiovascular:     Rate and Rhythm: Normal rate and regular rhythm.     Heart sounds: Normal heart sounds.  Pulmonary:     Effort: Pulmonary effort is normal.     Breath sounds: Normal breath sounds.     Comments: Rhonchi in the left lung base.  Skin:    General: Skin is warm and dry.     Comments: Hyperkeratotic lesion that is almost a small horn on the left forehead.   Neurological:     Mental Status: He is alert and oriented to person, place, and time.  Psychiatric:        Behavior: Behavior normal.      No results found for any visits on 01/16/23.     The ASCVD Risk score (Arnett DK, et al., 2019) failed to calculate for the following reasons:    The 2019 ASCVD risk score is only valid for ages 11 to 36    Assessment & Plan:   Problem List Items Addressed This Visit       Cardiovascular and Mediastinum   Essential hypertension (Chronic)   Relevant Orders   COMPLETE METABOLIC PANEL WITH GFR   Lipid Panel w/reflex Direct LDL   CBC   PSA   VITAMIN D 25 Hydroxy (Vit-D Deficiency, Fractures)     Respiratory   COPD with emphysema ([facility]) - Primary (Chronic)    It looks like Anoro might be better covered than the Stiolto.  I will send over new prescription and have the pharmacist run it if it still expensive encouraged him to check with his insurance to see what might be better covered.      Relevant Medications   umeclidinium-vilanterol (ANORO ELLIPTA) 62.5-25 MCG/ACT AEPB   Other Relevant Orders   COMPLETE METABOLIC PANEL WITH GFR   Lipid Panel w/reflex Direct LDL   CBC   PSA   VITAMIN D 25 Hydroxy (Vit-D Deficiency, Fractures)   DG Chest 2 View  Centrilobular emphysema ([place])    I am concerned that he is having increased cough and fatigue and he has also lost 6 pounds since I last saw him.  Did encourage him to try to increase his protein intake and make sure he is getting in enough calories.  Consider getting updated spirometry as well and possibly doing a walk test when he comes back in 3 to 4 weeks.  It may be that his emphysema is progressing.      Relevant Medications   umeclidinium-vilanterol (ANORO ELLIPTA) 62.5-25 MCG/ACT AEPB     Other   Cachexia ([place])    And concerned that he continues to lose weight.  Working to start with a plain film chest x-ray and get some updated labs today.  Consider CT and up-to-date spirometry for further evaluation as well.      Other Visit Diagnoses     Elevated serum creatinine       Relevant Orders   COMPLETE METABOLIC PANEL WITH GFR   Lipid Panel w/reflex Direct LDL   CBC   PSA   VITAMIN D 25 Hydroxy (Vit-D Deficiency, Fractures)   Vitamin D deficiency       Relevant  Orders   COMPLETE METABOLIC PANEL WITH GFR   Lipid Panel w/reflex Direct LDL   CBC   PSA   VITAMIN D 25 Hydroxy (Vit-D Deficiency, Fractures)   Elevated PSA       Relevant Orders   PSA   Keratotic lesion       Relevant Orders   Ambulatory referral to Dermatology       Return in about 3 weeks (around 02/06/2023) for COPD walk test .    Beatrice Lecher, MD

## 2023-01-17 LAB — COMPLETE METABOLIC PANEL WITH GFR
AG Ratio: 1.5 (calc) (ref 1.0–2.5)
ALT: 30 U/L (ref 9–46)
AST: 34 U/L (ref 10–35)
Albumin: 4.9 g/dL (ref 3.6–5.1)
Alkaline phosphatase (APISO): 57 U/L (ref 35–144)
BUN/Creatinine Ratio: 23 (calc) — ABNORMAL HIGH (ref 6–22)
BUN: 29 mg/dL — ABNORMAL HIGH (ref 7–25)
CO2: 26 mmol/L (ref 20–32)
Calcium: 9.7 mg/dL (ref 8.6–10.3)
Chloride: 103 mmol/L (ref 98–110)
Creat: 1.25 mg/dL — ABNORMAL HIGH (ref 0.70–1.22)
Globulin: 3.2 g/dL (calc) (ref 1.9–3.7)
Glucose, Bld: 96 mg/dL (ref 65–99)
Potassium: 4.7 mmol/L (ref 3.5–5.3)
Sodium: 141 mmol/L (ref 135–146)
Total Bilirubin: 0.6 mg/dL (ref 0.2–1.2)
Total Protein: 8.1 g/dL (ref 6.1–8.1)
eGFR: 56 mL/min/{1.73_m2} — ABNORMAL LOW (ref >=60)

## 2023-01-17 LAB — CBC
HCT: 44.3 % (ref 38.5–50.0)
Hemoglobin: 14.9 g/dL (ref 13.2–17.1)
MCH: 29.5 pg (ref 27.0–33.0)
MCHC: 33.6 g/dL (ref 32.0–36.0)
MCV: 87.7 fL (ref 80.0–100.0)
MPV: 10.1 fL (ref 7.5–12.5)
Platelets: 221 10*3/uL (ref 140–400)
RBC: 5.05 10*6/uL (ref 4.20–5.80)
RDW: 13.7 % (ref 11.0–15.0)
WBC: 5 10*3/uL (ref 3.8–10.8)

## 2023-01-17 LAB — LIPID PANEL W/REFLEX DIRECT LDL
Cholesterol: 158 mg/dL (ref <200)
HDL: 80 mg/dL (ref >=40)
LDL Cholesterol (Calc): 59 mg/dL (calc)
Non-HDL Cholesterol (Calc): 78 mg/dL (calc) (ref <130)
Total CHOL/HDL Ratio: 2 (calc) (ref <5.0)
Triglycerides: 108 mg/dL (ref <150)

## 2023-01-17 LAB — VITAMIN D 25 HYDROXY (VIT D DEFICIENCY, FRACTURES): Vit D, 25-Hydroxy: 66 ng/mL (ref 30–100)

## 2023-01-17 LAB — PSA: PSA: 6.3 ng/mL — ABNORMAL HIGH (ref <=4.00)

## 2023-01-17 NOTE — Progress Notes (Signed)
HI Timothy Waller, your serum Creatinine is 1.25.  This actually looks better than 9 months ago similar to what it was a year ago.  That is reassuring.  Liver function is normal.  PSA is up to 6.3.  Will plan to recheck again in 6 months to make sure that it stable.  Blood count and cholesterol look great.  Vitamin D looks great.  Please try to get scheduled for Medicare wellness exam with our Medicare wellness nurse.  It can be done in person here in our office or via a call.  But it looks like you are due.

## 2023-01-18 NOTE — Progress Notes (Signed)
Chest xray is normal. No pneumonia.  I would like to get you schedule for a breathing test to get update measure of your lung function. If that is [place] please let me know.

## 2023-01-24 NOTE — Telephone Encounter (Signed)
Task completed. Patient has been informed of [place] order. No other inquiries asked during call.

## 2023-01-24 NOTE — Telephone Encounter (Signed)
-----   Message from Donella Stade, [place] sent at 04/12/2022  1:00 PM EDT ----- Follow up renal u/s for complext right renal cyst.

## 2023-01-30 DIAGNOSIS — C44329 Squamous cell carcinoma of skin of other parts of face: Secondary | ICD-10-CM | POA: Diagnosis not present

## 2023-01-30 DIAGNOSIS — L821 Other seborrheic keratosis: Secondary | ICD-10-CM | POA: Diagnosis not present

## 2023-01-30 DIAGNOSIS — L578 Other skin changes due to chronic exposure to nonionizing radiation: Secondary | ICD-10-CM | POA: Diagnosis not present

## 2023-01-30 DIAGNOSIS — L814 Other melanin hyperpigmentation: Secondary | ICD-10-CM | POA: Diagnosis not present

## 2023-01-31 ENCOUNTER — Ambulatory Visit (INDEPENDENT_AMBULATORY_CARE_PROVIDER_SITE_OTHER): Payer: Medicare Other

## 2023-01-31 DIAGNOSIS — N281 Cyst of kidney, acquired: Secondary | ICD-10-CM

## 2023-01-31 DIAGNOSIS — N200 Calculus of kidney: Secondary | ICD-10-CM | POA: Diagnosis not present

## 2023-01-31 NOTE — Progress Notes (Signed)
Route to PCP

## 2023-02-01 ENCOUNTER — Telehealth: Payer: Self-pay | Admitting: Family Medicine

## 2023-02-01 NOTE — Progress Notes (Signed)
Hi Tramond, the ultrasound does show a complex cystic mass/growth on the right kidney.  It looks like they were able to compare it to prior imaging done back in January 2022 and the renal ultrasound done April 11, 2022.  They did recommend further workup with MRI even though the size has not really changed much.  They did get slightly different measurements but they felt like that was due to it being slightly different imaging.  So it does look like at least it has not grown larger which is again very reassuring.  They also see a second mass in the right kidney measuring 6 x 5 x 6 mm and it also previously measured a little bit larger.  Again they recommend MRI to take a better look at this as well.  They did see increased echogenicity meaning that there is some component of medical renal disease.  This can be secondary to high blood pressure, diabetes and other conditions.  We can certainly address that separately.  Also a kidney stone in the right kidney but it is not causing a problem right now.  If you are okay with moving forward with MRI then please let me know and we will go ahead and get that ordered, approved with the insurance, and scheduled.

## 2023-02-01 NOTE — Telephone Encounter (Signed)
Elnita Maxwell from [facility] radiology called wanting to speak someone about results from an ultrasound. Please give them a call back at [phone].

## 2023-02-01 NOTE — Telephone Encounter (Signed)
Spoke w/Cheryl and she wanted to be sure that his results were seen. I informed her that Dr. Linford Arnold will review this as soon as she could she had meetings this afternoon but was aware of the [place] that had been ordered.

## 2023-02-03 ENCOUNTER — Encounter: Payer: Self-pay | Admitting: Family Medicine

## 2023-02-03 ENCOUNTER — Telehealth: Payer: Self-pay

## 2023-02-03 DIAGNOSIS — N2889 Other specified disorders of kidney and ureter: Secondary | ICD-10-CM

## 2023-02-03 NOTE — Telephone Encounter (Signed)
Spoke with patient daughter Dewayne Hatch,  She wanted to make Dr. Linford Arnold aware  States patient  is very depressed  due to his wife's demenita and paranoia.  He is coping by drinking a lot of liquor.  Probably about 4 Brandy's per day and this is replacing his eating sometimes.  She is concerned. States patient is resistant to change.  She does not want patient to be aware that she called and gave this information.

## 2023-02-06 MED ORDER — LORAZEPAM 0.5 MG PO TABS: ORAL_TABLET | ORAL | 0 refills | Status: DC

## 2023-02-06 NOTE — Telephone Encounter (Signed)
Patient daughter Dewayne Hatch informed.

## 2023-02-13 ENCOUNTER — Ambulatory Visit: Payer: Medicare Other

## 2023-02-13 DIAGNOSIS — N2889 Other specified disorders of kidney and ureter: Secondary | ICD-10-CM

## 2023-02-13 DIAGNOSIS — D0439 Carcinoma in situ of skin of other parts of face: Secondary | ICD-10-CM | POA: Diagnosis not present

## 2023-02-13 DIAGNOSIS — K8020 Calculus of gallbladder without cholecystitis without obstruction: Secondary | ICD-10-CM | POA: Diagnosis not present

## 2023-02-13 MED ORDER — GADOBUTROL 1 MMOL/ML IV SOLN
6.0000 mL | Freq: Once | INTRAVENOUS | Status: AC | PRN
  Administered 2023-02-13: 6 mL via INTRAVENOUS

## 2023-02-14 ENCOUNTER — Encounter: Payer: Self-pay | Admitting: Family Medicine

## 2023-02-14 ENCOUNTER — Ambulatory Visit (INDEPENDENT_AMBULATORY_CARE_PROVIDER_SITE_OTHER): Payer: Medicare Other | Admitting: Family Medicine

## 2023-02-14 VITALS — BP 112/46 | HR 74 | Ht 68.0 in | Wt 129.0 lb

## 2023-02-14 DIAGNOSIS — I10 Essential (primary) hypertension: Secondary | ICD-10-CM | POA: Diagnosis not present

## 2023-02-14 DIAGNOSIS — R64 Cachexia: Secondary | ICD-10-CM | POA: Diagnosis not present

## 2023-02-14 DIAGNOSIS — Z636 Dependent relative needing care at home: Secondary | ICD-10-CM | POA: Diagnosis not present

## 2023-02-14 DIAGNOSIS — J432 Centrilobular emphysema: Secondary | ICD-10-CM

## 2023-02-14 DIAGNOSIS — F4329 Adjustment disorder with other symptoms: Secondary | ICD-10-CM

## 2023-02-14 NOTE — Assessment & Plan Note (Signed)
Encouraged him to really eat make sure he is increasing his p.o. intake.  He is quite thin and a BMI of 19 today.  He even has swallowing in his cheeks.  Encouraged him to make sure that he is getting enough protein in his diet also want him to really try to work on increasing his activity level has been very sedentary.  Work on healthy habits drinking mostly water.  Try to get enough sleep and rest at night.

## 2023-02-14 NOTE — Assessment & Plan Note (Addendum)
Currently on Anoro.  Former smoker quit 2 years ago.  Previously smoked a half a pack a day. From a tree today shows FVC 65%, FEV1 38% with a ratio 40.  20% improvement in FVC and 13% improvement in FEV1 after albuterol.  Attempt was fair.  He coughed a lot during the procedure. Will continue with Anoro.  He would be a candidate for Trelegy but he is not having a lot of flares or exacerbations that are requiring acute treatment. He did pass the 6-minute walk test today.  So no need for supplemental oxygen. Commend repeat testing with spirometry and walk test in 1 year.

## 2023-02-14 NOTE — Assessment & Plan Note (Signed)
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  He does not want to go up on the medication.

## 2023-02-14 NOTE — Assessment & Plan Note (Signed)
BP at goal though diastolic is a little bit low.  His blood pressure was normal a month ago so for now we will just monitor but we definitely have room to decrease the amlodipine down.

## 2023-02-14 NOTE — Progress Notes (Signed)
Established Patient Office Visit  Subjective   Patient ID: Timothy Waller, male    DOB: 1933/11/25  Age: 89 y.o. MRN: [id number]  Chief Complaint  Patient presents with   walk test    HPI  Didn't use his Anoro.  Today for 6-minute walk test. Dropped to 92%, from 96%  He did have his MRI and with and without contrast to follow-up on the kidney lesion on the right side measuring 6 x 5 x 6.  [facility] not back in yet.  He did have a skin lesion removed from his forehead and said that it was skin cancer is not sure if it was basal or squamous.  He went back yesterday for them to do some extra scraping.    ROS    Objective:     BP (!) 112/46   Pulse 74   Ht  (1.727 m)   Wt 129 lb (58.5 kg)   SpO2 96%   BMI 19.61 kg/m    Physical Exam Constitutional:      Appearance: He is well-developed.  HENT:     Head: Normocephalic and atraumatic.  Cardiovascular:     Rate and Rhythm: Normal rate and regular rhythm.     Heart sounds: Normal heart sounds.  Pulmonary:     Effort: Pulmonary effort is normal.     Comments: Prolonged coarse breath sounds. Skin:    General: Skin is warm and dry.  Neurological:     Mental Status: He is alert and oriented to person, place, and time.  Psychiatric:        Behavior: Behavior normal.      No results found for any visits on 02/14/23.    The ASCVD Risk score (Arnett DK, et al., 2019) failed to calculate for the following reasons:   The 2019 ASCVD risk score is only valid for ages 106 to 72    Assessment & Plan:   Problem List Items Addressed This Visit       Cardiovascular and Mediastinum   Essential hypertension (Chronic)    BP at goal though diastolic is a little bit low.  His blood pressure was normal a month ago so for now we will just monitor but we definitely have room to decrease the amlodipine down.        Respiratory   Centrilobular emphysema    Currently on Anoro.  Former smoker quit 2 years ago.  Previously  smoked a half a pack a day. From a tree today shows FVC 65%, FEV1 38% with a ratio 40.  20% improvement in FVC and 13% improvement in FEV1 after albuterol.  Attempt was fair.  He coughed a lot during the procedure. Will continue with Anoro.  He would be a candidate for Trelegy but he is not having a lot of flares or exacerbations that are requiring acute treatment. He did pass the 6-minute walk test today.  So no need for supplemental oxygen. Commend repeat testing with spirometry and walk test in 1 year.        Other   Stress and adjustment reaction    Is primary caregiver for his wife who is developed dementia it has been stressful I did offer to refer for therapy/counseling he is currently on Lexapro 5 mg and would like to stay on the medication he does feel like it is helpful he does report some irritability at times.  He does not want to go up on the medication.  Cachexia - Primary    Encouraged him to really eat make sure he is increasing his p.o. intake.  He is quite thin and a BMI of 19 today.  He even has swallowing in his cheeks.  Encouraged him to make sure that he is getting enough protein in his diet also want him to really try to work on increasing his activity level has been very sedentary.  Work on healthy habits drinking mostly water.  Try to get enough sleep and rest at night.      Other Visit Diagnoses     Caregiver stress           Also discussed need for Prevnar 20.  He says he will think about it and maybe get it done next time he is here.  Also encouraged him to consider getting the shingles vaccine now that there is no co-pay at the pharmacy with his Medicare part D.  Return in about 6 months (around 08/16/2023).    Nani Gasser, MD

## 2023-02-15 ENCOUNTER — Telehealth: Payer: Self-pay

## 2023-02-15 NOTE — Telephone Encounter (Signed)
Ruby with [facility] states call report for MRI abdomen results in patient chart for provider review.

## 2023-02-16 NOTE — Progress Notes (Signed)
Call patient: There is what looks like a lesion on the right kidney measuring about 1-1/2 x 1.3 cm so it is fairly small.  This is what they saw on the ultrasound.  It is possibly concerning for a renal cell carcinoma he has several other smaller cysts in the kidneys but these appear to be benign and do not have any concerning features.  These look normal.  He does have a small hiatal hernia which can sometimes cause some more heartburn or reflux.  And some gallstones.  In regards to the kidney lesion I would like to refer him to a specialist for further evaluation.  If he feels comfortable with moving forward then I would like to go ahead and expedite referral knowing it may take a little while to get an appointment.

## 2023-02-17 ENCOUNTER — Encounter: Payer: Self-pay | Admitting: Family Medicine

## 2023-02-17 DIAGNOSIS — N2889 Other specified disorders of kidney and ureter: Secondary | ICD-10-CM

## 2023-02-17 NOTE — Telephone Encounter (Signed)
Hi Kim, can you try to call Timothy Waller today about his results.  I know his daughter reached out but I do want to call him personally as well.  You are going ahead and placing the referral.

## 2023-03-03 ENCOUNTER — Encounter: Payer: Self-pay | Admitting: Emergency Medicine

## 2023-03-03 ENCOUNTER — Ambulatory Visit
Admission: EM | Admit: 2023-03-03 | Discharge: 2023-03-03 | Disposition: A | Discharge status: Home / Self Care | Payer: Medicare Other | Attending: Family Medicine | Admitting: Family Medicine

## 2023-03-03 DIAGNOSIS — D49511 Neoplasm of unspecified behavior of right kidney: Secondary | ICD-10-CM | POA: Diagnosis not present

## 2023-03-03 DIAGNOSIS — H6121 Impacted cerumen, right ear: Secondary | ICD-10-CM

## 2023-03-03 DIAGNOSIS — H6123 Impacted cerumen, bilateral: Secondary | ICD-10-CM | POA: Diagnosis not present

## 2023-03-03 NOTE — Discharge Instructions (Signed)
To prevent recurrent ear wax blockage, try the following: Soak two cotton balls with mineral oil, and gently place in each ear canal once weekly.  Leave the cotton balls in place for 10 to 20 minutes.  This will help liquefy the ear wax and aid your body's normal elimination process.  If applicable, do not use a hearing aid for 8 hours overnight.  Have your ears cleaned by a health professional every 6 to 12 months.  Avoid using "Q-tips" and ear wax softening solutions  

## 2023-03-03 NOTE — ED Provider Notes (Signed)
[facility]    CSN: [id number] Arrival date & time: 03/03/23  1113      History   Chief Complaint Chief Complaint  Patient presents with   Ear Fullness    bilateral    HPI Timothy Waller is a 89 y.o. male.   Patient complains of fullness in both ears, without pain, for about a week.  He has a history of recurring cerumen impaction that occurs about once per year.  The history is provided by the patient.    Past Medical History:  Diagnosis Date   Abnormal weight loss    COPD (chronic obstructive pulmonary disease) (HCC)    Hyperlipidemia    Osteoporosis 09/01/2010   Qualifier: Diagnosis of  By: Nicholaus Corolla CMA, Duncan Dull), Andrea      Patient Active Problem List   Diagnosis Date Noted   Acquired renal cyst of right kidney 04/12/2022   Right kidney stone 04/12/2022   Stress and adjustment reaction 12/08/2021   Lung nodule 11/29/2020   Pleural effusion 11/29/2020   Cachexia (HCC) 11/29/2020   Centrilobular emphysema (HCC) 11/29/2020   Benign localized prostatic hyperplasia with lower urinary tract symptoms (LUTS) 08/25/2020   Skin lesion of face 10/29/2019   Proteinuria 01/31/2018   Frequent urination 01/31/2018   Orthostasis 06/12/2017   Heavy tobacco smoker 06/12/2017   Benign colonic polyp 11/02/2016   Osteopenia 02/17/2016   Essential hypertension 02/09/2016   Special screening for malignant neoplasms, colon 03/05/2015   Hyperlipidemia 12/16/2014   COPD with emphysema (HCC) 12/15/2014   SHOULDER PAIN 08/24/2010   HEMATURIA UNSPECIFIED 06/11/2009   ERECTILE DYSFUNCTION, ORGANIC 06/11/2009   SEBORRHEIC KERATOSIS 06/11/2009   LUMBAGO 06/11/2009    Past Surgical History:  Procedure Laterality Date   COLONOSCOPY     INNER EAR SURGERY     hole in eardrum 30 years ago       Home Medications    Prior to Admission medications   Medication Sig Start Date End Date Taking? Authorizing Provider  amLODipine (NORVASC) 10 MG tablet TAKE 1 TABLET BY  MOUTH EVERY DAY 12/29/22   Agapito Games, MD  Ascorbic Acid (VITAMIN C) 1000 MG tablet Take 1,000 mg by mouth daily.    [provider]  atorvastatin (LIPITOR) 10 MG tablet TAKE 1 TABLET BY MOUTH EVERY DAY 09/20/22   Agapito Games, MD  escitalopram (LEXAPRO) 5 MG tablet Take 1 tablet (5 mg total) by mouth daily. 01/03/22   Agapito Games, MD  fluticasone (FLONASE) 50 MCG/ACT nasal spray Place 2 sprays into both nostrils daily. 11/16/21   Everrett Coombe, DO  Multiple Vitamins-Minerals (CENTRUM PO) Take 1 tablet by mouth daily.    [provider]  Salicylic Acid 3 % SHAM Apply 1 application topically 3 (three) times a week. 03/10/21   Sunnie Nielsen, DO  umeclidinium-vilanterol [facility] ELLIPTA) 62.5-25 MCG/ACT AEPB Inhale 1 puff into the lungs in the morning. 01/16/23   Agapito Games, MD    Family History Family History  Problem Relation Age of Onset   Cancer Father        mouth cancer     Social History Social History   Tobacco Use   Smoking status: Former    Packs/day: .5    Types: Cigarettes    Quit date: 10/24/2020    Years since quitting: 2.3   Smokeless tobacco: Never  Vaping Use   Vaping Use: Never used  Substance Use Topics   Alcohol use: Yes  Alcohol/week: 0.0 standard drinks of alcohol    Comment: 2-3 drinks per week    Drug use: No     Allergies   Patient has no known allergies.   Review of Systems Review of Systems No sore throat No cough No pleuritic pain No wheezing No nasal congestion No post-nasal drainage No sinus pain/pressure No itchy/red eyes No earache, but ears feel full No hemoptysis No SOB No fever/chills No nausea No vomiting No abdominal pain No diarrhea No urinary symptoms No skin rash No fatigue No myalgias No headache   Physical Exam Triage Vital Signs ED Triage Vitals  Enc Vitals Group     BP 03/03/23 1153 (!) 145/75     Pulse Rate 03/03/23 1153 74     Resp 03/03/23 1153 16      Temp 03/03/23 1153 97.7 F (36.5 C)     Temp Source 03/03/23 1153 Oral     SpO2 03/03/23 1153 94 %     Weight 03/03/23 1156 129 lb (58.5 kg)     Height 03/03/23 1156 5\' 8"  (1.727 m)     Head Circumference --      Peak Flow --      Pain Score 03/03/23 1156 0     Pain Loc --      Pain Edu? --      Excl. in GC? --    No data found.  Updated Vital Signs BP (!) 145/75 (BP Location: Left Arm)   Pulse 74   Temp 97.7 F (36.5 C) (Oral)   Resp 16   Ht 5\' 8"  (1.727 m)   Wt 58.5 kg   SpO2 94%   BMI 19.61 kg/m   Visual Acuity Right Eye Distance:   Left Eye Distance:   Bilateral Distance:    Right Eye Near:   Left Eye Near:    Bilateral Near:     Physical Exam Vitals and nursing note reviewed.  Constitutional:      General: He is not in acute distress. HENT:     Head: Normocephalic.     Right Ear: There is impacted cerumen.     Left Ear: There is no impacted cerumen.     Ears:     Comments: Post lavage, both tympanic membranes and canals appear normal.    Nose: Rhinorrhea present.     Mouth/Throat:     Mouth: Mucous membranes are moist.  Eyes:     Pupils: Pupils are equal, round, and reactive to light.  Cardiovascular:     Rate and Rhythm: Normal rate.  Pulmonary:     Effort: Pulmonary effort is normal.  Skin:    General: Skin is warm and dry.  Neurological:     Mental Status: He is alert and oriented to person, place, and time.      UC Treatments / Results  Labs (all labs ordered are listed, but only abnormal results are displayed) Labs Reviewed - No data to display  EKG   Radiology No results found.  Procedures Procedures  Bilateral cerumen impaction removal by nurse.  Medications Ordered in UC Medications - No data to display  Initial Impression / Assessment and Plan / UC Course  I have reviewed the triage vital signs and the nursing notes.  Pertinent labs & imaging results that were available during my care of the patient were reviewed by  me and considered in my medical decision making (see chart for details).    No evidence otitis media or externa.  Final Clinical Impressions(s) / UC Diagnoses   Final diagnoses:  Bilateral impacted cerumen     Discharge Instructions      To prevent recurrent ear wax blockage, try the following: Soak two cotton balls with mineral oil, and gently place in each ear canal once weekly.  Leave the cotton balls in place for 10 to 20 minutes.  This will help liquefy the ear wax and aid your body's normal elimination process.  If applicable, do not use a hearing aid for 8 hours overnight.  Have your ears cleaned by a health professional every 6 to 12 months.  Avoid using "Q-tips" and ear wax softening solutions     ED Prescriptions   None       Lattie Haw, MD 03/04/23 1531

## 2023-03-03 NOTE — ED Triage Notes (Signed)
Fullness to both ears x 1 week  Hx of cerumen impaction

## 2023-03-30 ENCOUNTER — Other Ambulatory Visit: Payer: Self-pay | Admitting: Family Medicine

## 2023-07-05 ENCOUNTER — Ambulatory Visit: Payer: Medicare Other

## 2023-07-06 ENCOUNTER — Other Ambulatory Visit: Payer: Self-pay | Admitting: *Deleted

## 2023-07-06 ENCOUNTER — Ambulatory Visit (INDEPENDENT_AMBULATORY_CARE_PROVIDER_SITE_OTHER): Payer: Medicare Other

## 2023-07-06 DIAGNOSIS — Z23 Encounter for immunization: Secondary | ICD-10-CM | POA: Diagnosis not present

## 2023-07-06 DIAGNOSIS — F4329 Adjustment disorder with other symptoms: Secondary | ICD-10-CM

## 2023-07-06 MED ORDER — ESCITALOPRAM OXALATE 5 MG PO TABS: 5.0000 mg | ORAL_TABLET | Freq: Every day | ORAL | 1 refills | Status: DC

## 2023-07-06 MED ORDER — AMLODIPINE BESYLATE 10 MG PO TABS: 10.0000 mg | ORAL_TABLET | Freq: Every day | ORAL | 0 refills | Status: DC

## 2023-09-18 ENCOUNTER — Other Ambulatory Visit: Payer: Self-pay | Admitting: *Deleted

## 2023-09-18 ENCOUNTER — Other Ambulatory Visit: Payer: Self-pay | Admitting: Internal Medicine

## 2023-09-18 ENCOUNTER — Ambulatory Visit: Payer: Medicare Other | Attending: Internal Medicine

## 2023-09-18 DIAGNOSIS — I499 Cardiac arrhythmia, unspecified: Secondary | ICD-10-CM

## 2023-09-18 DIAGNOSIS — R002 Palpitations: Secondary | ICD-10-CM

## 2023-09-18 NOTE — Progress Notes (Unsigned)
ZIO serial # [id number] from office inventory given to daughter Enzo Bi.

## 2023-09-18 NOTE — Progress Notes (Signed)
Per Dr. Tenny Craw, patient is reporting frequent irregular heart beats and noticed pulse ox saying irregular heart beat.  Dr. Tenny Craw ordered for patient to wear 14 day Zio monitor.

## 2023-10-10 DIAGNOSIS — R002 Palpitations: Secondary | ICD-10-CM | POA: Diagnosis not present

## 2023-10-10 DIAGNOSIS — I499 Cardiac arrhythmia, unspecified: Secondary | ICD-10-CM | POA: Diagnosis not present

## 2023-11-25 ENCOUNTER — Encounter: Payer: Self-pay | Admitting: Family Medicine

## 2023-12-18 ENCOUNTER — Other Ambulatory Visit: Payer: Self-pay | Admitting: *Deleted

## 2023-12-18 DIAGNOSIS — J439 Emphysema, unspecified: Secondary | ICD-10-CM

## 2023-12-18 DIAGNOSIS — F4329 Adjustment disorder with other symptoms: Secondary | ICD-10-CM

## 2023-12-18 MED ORDER — ESCITALOPRAM OXALATE 5 MG PO TABS: 5.0000 mg | ORAL_TABLET | Freq: Every day | ORAL | 1 refills | Status: DC

## 2023-12-18 MED ORDER — FLUTICASONE PROPIONATE 50 MCG/ACT NA SUSP: 2.0000 | Freq: Every day | NASAL | 6 refills | Status: AC

## 2023-12-18 MED ORDER — AMLODIPINE BESYLATE 10 MG PO TABS: 10.0000 mg | ORAL_TABLET | Freq: Every day | ORAL | 0 refills | Status: DC

## 2023-12-18 MED ORDER — ANORO ELLIPTA 62.5-25 MCG/ACT IN AEPB: 1.0000 | INHALATION_SPRAY | Freq: Every morning | RESPIRATORY_TRACT | 5 refills | Status: DC

## 2023-12-18 NOTE — Progress Notes (Signed)
 Rxs sent

## 2024-01-05 ENCOUNTER — Other Ambulatory Visit: Payer: Self-pay | Admitting: Urology

## 2024-01-05 DIAGNOSIS — D49511 Neoplasm of unspecified behavior of right kidney: Secondary | ICD-10-CM

## 2024-01-08 ENCOUNTER — Ambulatory Visit: Admitting: Family Medicine

## 2024-01-08 DIAGNOSIS — I10 Essential (primary) hypertension: Secondary | ICD-10-CM

## 2024-01-08 DIAGNOSIS — R972 Elevated prostate specific antigen [PSA]: Secondary | ICD-10-CM

## 2024-01-21 ENCOUNTER — Ambulatory Visit
Admission: EM | Admit: 2024-01-21 | Discharge: 2024-01-21 | Disposition: A | Discharge status: Home / Self Care | Attending: Family Medicine | Admitting: Family Medicine

## 2024-01-21 ENCOUNTER — Other Ambulatory Visit: Payer: Self-pay

## 2024-01-21 DIAGNOSIS — H6123 Impacted cerumen, bilateral: Secondary | ICD-10-CM

## 2024-01-21 NOTE — Discharge Instructions (Signed)
 Return as needed

## 2024-01-21 NOTE — ED Triage Notes (Signed)
 Ears feeling clogged x 2 days. Has to have them flushed every year.

## 2024-01-21 NOTE — ED Provider Notes (Signed)
 [facility]    CSN: [id number] Arrival date & time: 01/21/24  0818      History   Chief Complaint No chief complaint on file.   HPI Timothy Waller is a 89 y.o. male.   HPI  Patient says he has always had problems with ear wax buildup and has to have them rinsed about once a year.  Is here today because they feel blocked No other complaint  Past Medical History:  Diagnosis Date   Abnormal weight loss    COPD (chronic obstructive pulmonary disease) (HCC)    Hyperlipidemia    Osteoporosis 09/01/2010   Qualifier: Diagnosis of  By: Nicholaus Corolla CMA, Duncan Dull), Andrea      Patient Active Problem List   Diagnosis Date Noted   Acquired renal cyst of right kidney 04/12/2022   Right kidney stone 04/12/2022   Stress and adjustment reaction 12/08/2021   Lung nodule 11/29/2020   Pleural effusion 11/29/2020   Cachexia (HCC) 11/29/2020   Centrilobular emphysema (HCC) 11/29/2020   Benign localized prostatic hyperplasia with lower urinary tract symptoms (LUTS) 08/25/2020   Skin lesion of face 10/29/2019   Proteinuria 01/31/2018   Frequent urination 01/31/2018   Orthostasis 06/12/2017   Heavy tobacco smoker 06/12/2017   Benign colonic polyp 11/02/2016   Osteopenia 02/17/2016   Essential hypertension 02/09/2016   Special screening for malignant neoplasms, colon 03/05/2015   Hyperlipidemia 12/16/2014   COPD with emphysema (HCC) 12/15/2014   SHOULDER PAIN 08/24/2010   HEMATURIA UNSPECIFIED 06/11/2009   ERECTILE DYSFUNCTION, ORGANIC 06/11/2009   SEBORRHEIC KERATOSIS 06/11/2009   LUMBAGO 06/11/2009    Past Surgical History:  Procedure Laterality Date   COLONOSCOPY     INNER EAR SURGERY     hole in eardrum 30 years ago       Home Medications    Prior to Admission medications   Medication Sig Start Date End Date Taking? Authorizing Provider  amLODipine (NORVASC) 10 MG tablet Take 1 tablet (10 mg total) by mouth daily. 12/18/23   Agapito Games, MD  Ascorbic  Acid (VITAMIN C) 1000 MG tablet Take 1,000 mg by mouth daily.    [provider]  atorvastatin (LIPITOR) 10 MG tablet TAKE 1 TABLET BY MOUTH EVERY DAY 09/20/22   Agapito Games, MD  escitalopram (LEXAPRO) 5 MG tablet Take 1 tablet (5 mg total) by mouth daily. 12/18/23   Agapito Games, MD  fluticasone (FLONASE) 50 MCG/ACT nasal spray Place 2 sprays into both nostrils daily. 12/18/23   Agapito Games, MD  Multiple Vitamins-Minerals (CENTRUM PO) Take 1 tablet by mouth daily.    [provider]  umeclidinium-vilanterol [facility] ELLIPTA) 62.5-25 MCG/ACT AEPB Inhale 1 puff into the lungs in the morning. 12/18/23   Agapito Games, MD    Family History Family History  Problem Relation Age of Onset   Cancer Father        mouth cancer     Social History Social History   Tobacco Use   Smoking status: Former    Current packs/day: 0.00    Types: Cigarettes    Quit date: 10/24/2020    Years since quitting: 3.2   Smokeless tobacco: Never  Vaping Use   Vaping status: Never Used  Substance Use Topics   Alcohol use: Yes    Alcohol/week: 0.0 standard drinks of alcohol    Comment: 2-3 drinks per week    Drug use: No     Allergies   Patient has no known  allergies.   Review of Systems Review of Systems See HPI  Physical Exam Triage Vital Signs ED Triage Vitals  Encounter Vitals Group     BP 01/21/24 0827 (!) 176/75     Systolic BP Percentile --      Diastolic BP Percentile --      Pulse Rate 01/21/24 0827 76     Resp 01/21/24 0827 16     Temp 01/21/24 0827 97.8 F (36.6 C)     Temp src --      SpO2 01/21/24 0827 94 %     Weight --      Height --      Head Circumference --      Peak Flow --      Pain Score 01/21/24 0830 0     Pain Loc --      Pain Education --      Exclude from Growth Chart --    No data found.  Updated Vital Signs BP (!) 176/75   Pulse 76   Temp 97.8 F (36.6 C)   Resp 16   SpO2 94%      Physical  Exam Constitutional:      General: He is not in acute distress.    Appearance: He is well-developed.  HENT:     Head: Normocephalic and atraumatic.     Right Ear: There is impacted cerumen.     Left Ear: There is impacted cerumen.  Eyes:     Conjunctiva/sclera: Conjunctivae normal.     Pupils: Pupils are equal, round, and reactive to light.  Cardiovascular:     Rate and Rhythm: Normal rate.  Pulmonary:     Effort: Pulmonary effort is normal. No respiratory distress.  Musculoskeletal:        General: Normal range of motion.     Cervical back: Normal range of motion.  Skin:    General: Skin is warm and dry.  Neurological:     Mental Status: He is alert.      UC Treatments / Results  Labs (all labs ordered are listed, but only abnormal results are displayed) Labs Reviewed - No data to display  EKG   Radiology No results found.  Procedures ::after irrigation and curettage the ears are clear.  TMs clear  Medications Ordered in UC Medications - No data to display  Initial Impression / Assessment and Plan / UC Course  I have reviewed the triage vital signs and the nursing notes.  Pertinent labs & imaging results that were available during my care of the patient were reviewed by me and considered in my medical decision making (see chart for details).      Final Clinical Impressions(s) / UC Diagnoses   Final diagnoses:  Bilateral hearing loss due to cerumen impaction     Discharge Instructions      Return as needed     ED Prescriptions   None    PDMP not reviewed this encounter.   Eustace Moore, MD 01/21/24 [phone]

## 2024-02-08 ENCOUNTER — Other Ambulatory Visit: Payer: Self-pay | Admitting: Family Medicine

## 2024-02-08 DIAGNOSIS — J439 Emphysema, unspecified: Secondary | ICD-10-CM

## 2024-04-17 ENCOUNTER — Ambulatory Visit

## 2024-04-18 ENCOUNTER — Ambulatory Visit

## 2024-04-25 ENCOUNTER — Encounter: Payer: Self-pay | Admitting: Family Medicine

## 2024-04-25 ENCOUNTER — Ambulatory Visit

## 2024-04-25 ENCOUNTER — Ambulatory Visit (INDEPENDENT_AMBULATORY_CARE_PROVIDER_SITE_OTHER): Admitting: Family Medicine

## 2024-04-25 VITALS — BP 122/56 | HR 68 | Ht 68.0 in | Wt 131.0 lb

## 2024-04-25 DIAGNOSIS — M85861 Other specified disorders of bone density and structure, right lower leg: Secondary | ICD-10-CM | POA: Diagnosis not present

## 2024-04-25 DIAGNOSIS — J432 Centrilobular emphysema: Secondary | ICD-10-CM

## 2024-04-25 DIAGNOSIS — M25561 Pain in right knee: Secondary | ICD-10-CM | POA: Diagnosis not present

## 2024-04-25 DIAGNOSIS — F4329 Adjustment disorder with other symptoms: Secondary | ICD-10-CM | POA: Diagnosis not present

## 2024-04-25 DIAGNOSIS — M25461 Effusion, right knee: Secondary | ICD-10-CM | POA: Diagnosis not present

## 2024-04-25 DIAGNOSIS — E7849 Other hyperlipidemia: Secondary | ICD-10-CM | POA: Diagnosis not present

## 2024-04-25 DIAGNOSIS — I10 Essential (primary) hypertension: Secondary | ICD-10-CM

## 2024-04-25 DIAGNOSIS — J439 Emphysema, unspecified: Secondary | ICD-10-CM

## 2024-04-25 DIAGNOSIS — R790 Abnormal level of blood mineral: Secondary | ICD-10-CM | POA: Diagnosis not present

## 2024-04-25 MED ORDER — ATORVASTATIN CALCIUM 10 MG PO TABS: 10.0000 mg | ORAL_TABLET | Freq: Every day | ORAL | 3 refills | Status: AC

## 2024-04-25 MED ORDER — ESCITALOPRAM OXALATE 5 MG PO TABS: 5.0000 mg | ORAL_TABLET | Freq: Every day | ORAL | 3 refills | Status: AC

## 2024-04-25 MED ORDER — AMLODIPINE BESYLATE 10 MG PO TABS: 10.0000 mg | ORAL_TABLET | Freq: Every day | ORAL | 3 refills | Status: AC

## 2024-04-25 NOTE — Assessment & Plan Note (Signed)
 To rrecheck recheck lipid levels.  Based on his refill history I suspect his levels will probably be elevated again but I did go ahead and refill the medication today his last cardiovascular risk or was 25% which is quite significant.

## 2024-04-25 NOTE — Assessment & Plan Note (Signed)
 He does feel like the citalopram is helpful and would like a refill on it today just takes 5 mg.  He is the primary caregiver for his wife who has pretty advanced dementia and is sundowning.  His daughter is helping him look for a place for her to stay that has a memory care unit as it is getting overwhelming in fact they had someone come in this week to see if they could come in a couple days a week to help him out.

## 2024-04-25 NOTE — Progress Notes (Signed)
 Acute Office Visit  Subjective:     Patient ID: Timothy Waller, male    DOB: 1934-04-15, 89 y.o.   MRN: [id number]  Chief Complaint  Patient presents with   Knee Pain    R knee     HPI Patient is in today for   Right knee swelling.  No specific injury or trauma but he said it started getting really swollen a couple of days ago just feels like a dull ache it makes it hard to fall asleep he is mostly been using Tylenol and has iced it a little bit.  He says he has had problems with that knee before but it always seem to go away on its own but this seemed more than his usual issues with the knee.  Hypertension- Pt denies chest pain, SOB, dizziness, or heart palpitations.  Taking meds as directed w/o problems.  Denies medication side effects.    Last time I saw him he had lost a significant amount of weight but he is actually stabilized and is up about 2 pounds since then.  ROS      Objective:    BP (!) 122/56   Pulse 68   Ht 5' 8 (1.727 m)   Wt 131 lb (59.4 kg)   SpO2 95%   BMI 19.92 kg/m    Physical Exam Vitals and nursing note reviewed.  Constitutional:      Appearance: Normal appearance.  HENT:     Head: Normocephalic and atraumatic.  Eyes:     Conjunctiva/sclera: Conjunctivae normal.  Cardiovascular:     Rate and Rhythm: Normal rate and regular rhythm.  Pulmonary:     Effort: Pulmonary effort is normal.     Breath sounds: Normal breath sounds.  Musculoskeletal:     Comments: Right knee with an effusion tender over the medial joint line and medially along the edge of the patella.  Unable to fully extend the knee.  No crepitus.  No increased laxity.  Skin:    General: Skin is warm and dry.  Neurological:     Mental Status: He is alert.  Psychiatric:        Mood and Affect: Mood normal.     No results found for any visits on 04/25/24.      Assessment & Plan:   Problem List Items Addressed This Visit       Cardiovascular and Mediastinum    Essential hypertension (Chronic)   Well controlled. Continue current regimen. Follow up in  59mo       Relevant Medications   atorvastatin  (LIPITOR) 10 MG tablet   amLODipine  (NORVASC ) 10 MG tablet   Other Relevant Orders   CMP14+EGFR   Lipid panel   CBC   Fe+TIBC+Fer     Respiratory   COPD with emphysema (HCC) (Chronic)   Relevant Orders   CMP14+EGFR   Lipid panel   CBC   Fe+TIBC+Fer   Centrilobular emphysema (HCC)   He is not using the Anoro every day uses it occasionally he has a little bit of a chronic cough but no recent increase or change in symptoms.      Relevant Orders   CMP14+EGFR   Lipid panel   CBC   Fe+TIBC+Fer     Other   Hyperlipidemia (Chronic)   To rrecheck recheck lipid levels.  Based on his refill history I suspect his levels will probably be elevated again but I did go ahead and refill the medication today his last cardiovascular  risk or was 25% which is quite significant.      Relevant Medications   atorvastatin  (LIPITOR) 10 MG tablet   amLODipine  (NORVASC ) 10 MG tablet   Stress and adjustment reaction   He does feel like the citalopram is helpful and would like a refill on it today just takes 5 mg.  He is the primary caregiver for his wife who has pretty advanced dementia and is sundowning.  His daughter is helping him look for a place for her to stay that has a memory care unit as it is getting overwhelming in fact they had someone come in this week to see if they could come in a couple days a week to help him out.      Relevant Medications   escitalopram  (LEXAPRO ) 5 MG tablet   Other Visit Diagnoses       Low ferritin    -  Primary   Relevant Orders   CMP14+EGFR   Lipid panel   CBC   Fe+TIBC+Fer     Acute pain of right knee       Relevant Orders   DG Knee Complete 4 Views Right      Acute left knee pain and swelling-will get x-rays today recommend compression okay to continue with Tylenol recommend regular icing and elevation during  the day.  If not improving over this next week I like for him to see Dr. Curtis for further evaluation and possibly to have the fluid drained.  Meds ordered this encounter  Medications   atorvastatin  (LIPITOR) 10 MG tablet    Sig: Take 1 tablet (10 mg total) by mouth at bedtime.    Dispense:  100 tablet    Refill:  3   escitalopram  (LEXAPRO ) 5 MG tablet    Sig: Take 1 tablet (5 mg total) by mouth daily.    Dispense:  90 tablet    Refill:  3   amLODipine  (NORVASC ) 10 MG tablet    Sig: Take 1 tablet (10 mg total) by mouth daily.    Dispense:  90 tablet    Refill:  3    No follow-ups on file.  Dorothyann Byars, MD

## 2024-04-25 NOTE — Assessment & Plan Note (Signed)
 He is not using the Anoro every day uses it occasionally he has a little bit of a chronic cough but no recent increase or change in symptoms.

## 2024-04-25 NOTE — Assessment & Plan Note (Signed)
 Well controlled. Continue current regimen. Follow up in  6 mo

## 2024-04-26 LAB — CMP14+EGFR
ALT: 12 IU/L (ref 0–44)
AST: 19 IU/L (ref 0–40)
Albumin: 4.2 g/dL (ref 3.7–4.7)
Alkaline Phosphatase: 63 IU/L (ref 44–121)
BUN/Creatinine Ratio: 21 (ref 10–24)
BUN: 28 mg/dL — ABNORMAL HIGH (ref 8–27)
Bilirubin Total: 0.5 mg/dL (ref 0.0–1.2)
CO2: 16 mmol/L — ABNORMAL LOW (ref 20–29)
Calcium: 9.1 mg/dL (ref 8.6–10.2)
Chloride: 102 mmol/L (ref 96–106)
Creatinine, Ser: 1.34 mg/dL — ABNORMAL HIGH (ref 0.76–1.27)
Globulin, Total: 2.7 g/dL (ref 1.5–4.5)
Glucose: 85 mg/dL (ref 70–99)
Potassium: 4.5 mmol/L (ref 3.5–5.2)
Sodium: 141 mmol/L (ref 134–144)
Total Protein: 6.9 g/dL (ref 6.0–8.5)
eGFR: 51 mL/min/1.73 — ABNORMAL LOW (ref >59)

## 2024-04-26 LAB — IRON,TIBC AND FERRITIN PANEL
Ferritin: 105 ng/mL (ref 30–400)
Iron Saturation: 9 % — CL (ref 15–55)
Iron: 27 ug/dL — ABNORMAL LOW (ref 38–169)
Total Iron Binding Capacity: 294 ug/dL (ref 250–450)
UIBC: 267 ug/dL (ref 111–343)

## 2024-04-26 LAB — CBC
Hematocrit: 36.5 % — ABNORMAL LOW (ref 37.5–51.0)
Hemoglobin: 11.9 g/dL — ABNORMAL LOW (ref 13.0–17.7)
MCH: 29.5 pg (ref 26.6–33.0)
MCHC: 32.6 g/dL (ref 31.5–35.7)
MCV: 90 fL (ref 79–97)
Platelets: 351 x10E3/uL (ref 150–450)
RBC: 4.04 x10E6/uL — ABNORMAL LOW (ref 4.14–5.80)
RDW: 13.1 % (ref 11.6–15.4)
WBC: 7.4 x10E3/uL (ref 3.4–10.8)

## 2024-04-26 LAB — LIPID PANEL
Chol/HDL Ratio: 2 ratio (ref 0.0–5.0)
Cholesterol, Total: 178 mg/dL (ref 100–199)
HDL: 90 mg/dL (ref >39)
LDL Chol Calc (NIH): 75 mg/dL (ref 0–99)
Triglycerides: 66 mg/dL (ref 0–149)
VLDL Cholesterol Cal: 13 mg/dL (ref 5–40)

## 2024-04-28 ENCOUNTER — Ambulatory Visit: Payer: Self-pay | Admitting: Family Medicine

## 2024-04-28 NOTE — Progress Notes (Signed)
 Hi Sir, your blood work shows that you are anemic and your iron is quite low.  Normal is 38 269.  Would like to have you somewhere in the middle and years is 19.  Are you losing any blood in your urine or stool?  If not, we may still need to consider getting a stool sample with a little test kit that you can do at home just to evaluate for blood loss.  Otherwise I do not have a great reason for you to have low iron. Kidney function is stable.  Liver function is normal.  Looks good.

## 2024-04-28 NOTE — Progress Notes (Signed)
 Malone, x-ray looked okay as far as no underlying fracture or dislocation.  They could see the fluid and commented on that.  If that swelling has not gone down after this weekend then please let us  know and we will get you in with Dr. ONEIDA he may need to draw sample from that knee and possibly even send it off for further evaluation.

## 2024-04-30 ENCOUNTER — Ambulatory Visit (INDEPENDENT_AMBULATORY_CARE_PROVIDER_SITE_OTHER): Admitting: Sports Medicine

## 2024-04-30 ENCOUNTER — Encounter: Payer: Self-pay | Admitting: Sports Medicine

## 2024-04-30 DIAGNOSIS — M25461 Effusion, right knee: Secondary | ICD-10-CM | POA: Diagnosis not present

## 2024-04-30 NOTE — Patient Instructions (Signed)
 Timothy Waller

## 2024-04-30 NOTE — Progress Notes (Signed)
    Procedures performed today:    None.  Independent interpretation of notes and tests performed by another provider:   X-rays personally reviewed, I do not see much arthritis, he does have some posterior vascular calcifications as well as only minimal spurring tibial spines.  Brief History, Exam, Impression, and Recommendations:    Swelling of right knee Very pleasant previously healthy 89 year old male, he had rapid onset swelling right knee, saw his PCP, x-rays were done that showed an effusion but no significant osteoarthritis. Symptoms have improved considerably since he has seen his PCP. On exam he still has an effusion, only minimal tenderness medial joint line. Otherwise good motion, good strength, some pain with terminal flexion. Suspect a meniscal tear, as he is improving we will continue conservative treatment, continue Tylenol, he will do a knee sleeve, home physical therapy, return in 6 weeks as needed.    ____________________________________________ Debby PARAS. Curtis, M.D., ABFM., CAQSM., AME. Primary Care and Sports Medicine [facility]  Adjunct Professor of [facility] Medicine  [employer]  Restaurant manager, fast food

## 2024-04-30 NOTE — Assessment & Plan Note (Signed)
 Very pleasant previously healthy 89 year old male, he had rapid onset swelling right knee, saw his PCP, x-rays were done that showed an effusion but no significant osteoarthritis. Symptoms have improved considerably since he has seen his PCP. On exam he still has an effusion, only minimal tenderness medial joint line. Otherwise good motion, good strength, some pain with terminal flexion. Suspect a meniscal tear, as he is improving we will continue conservative treatment, continue Tylenol, he will do a knee sleeve, home physical therapy, return in 6 weeks as needed.

## 2024-05-01 NOTE — Progress Notes (Signed)
 I am also going to refer him to GI doctor as well.  Does he have a preference for provider or location? Does he perefer [place]?

## 2024-05-04 ENCOUNTER — Ambulatory Visit
Admission: RE | Admit: 2024-05-04 | Discharge: 2024-05-04 | Disposition: A | Discharge status: Home / Self Care | Source: Ambulatory Visit | Attending: Family Medicine | Admitting: Family Medicine

## 2024-05-04 ENCOUNTER — Other Ambulatory Visit: Payer: Self-pay

## 2024-05-04 ENCOUNTER — Ambulatory Visit

## 2024-05-04 VITALS — BP 137/77 | HR 65 | Temp 97.7°F | Resp 19

## 2024-05-04 DIAGNOSIS — J441 Chronic obstructive pulmonary disease with (acute) exacerbation: Secondary | ICD-10-CM | POA: Diagnosis not present

## 2024-05-04 MED ORDER — PREDNISONE 20 MG PO TABS: ORAL_TABLET | ORAL | 0 refills | Status: DC

## 2024-05-04 NOTE — Discharge Instructions (Addendum)
 Advised patient to take medication as directed with food to completion.  Encouraged to increase daily water  intake to 64 ounces per day while taking his medication.  Advised if symptoms worsen and/or unresolved please follow with your PCP or here for further evaluation.

## 2024-05-04 NOTE — ED Triage Notes (Signed)
 [facility] with [facility]. Pt reports he has a history of COPD and his daughter had heard his cough yesterday and made him this appointment. Pt thinks he is okay just saw dr denita 2 weeks ago and has been using long term managament at home.

## 2024-05-04 NOTE — ED Provider Notes (Signed)
 Timothy Waller CARE    CSN: [id number] Arrival date & time: 05/04/24  0907      History   Chief Complaint Chief Complaint  Patient presents with   Cough   COPD    HPI Timothy Waller is a 89 y.o. male.   HPI Very pleasant 89 year old male presents with cough for 2 days.  Patient reports has history of COPD and just saw his PCP 2 weeks ago.  Patient reports using long term management at home.  PMH significant for COPD, abnormal weight loss, and HLD.  Past Medical History:  Diagnosis Date   Abnormal weight loss    COPD (chronic obstructive pulmonary disease) (HCC)    Hyperlipidemia    Osteoporosis 09/01/2010   Qualifier: Diagnosis of  By: Merrell CMA, LEODIS), Andrea      Patient Active Problem List   Diagnosis Date Noted   Swelling of right knee 04/30/2024   Acquired renal cyst of right kidney 04/12/2022   Right kidney stone 04/12/2022   Stress and adjustment reaction 12/08/2021   Lung nodule 11/29/2020   Pleural effusion 11/29/2020   Cachexia (HCC) 11/29/2020   Centrilobular emphysema (HCC) 11/29/2020   Benign localized prostatic hyperplasia with lower urinary tract symptoms (LUTS) 08/25/2020   Skin lesion of face 10/29/2019   Proteinuria 01/31/2018   Frequent urination 01/31/2018   Orthostasis 06/12/2017   Heavy tobacco smoker 06/12/2017   Benign colonic polyp 11/02/2016   Osteopenia 02/17/2016   Essential hypertension 02/09/2016   Special screening for malignant neoplasms, colon 03/05/2015   Hyperlipidemia 12/16/2014   COPD with emphysema (HCC) 12/15/2014   SHOULDER PAIN 08/24/2010   HEMATURIA UNSPECIFIED 06/11/2009   ERECTILE DYSFUNCTION, ORGANIC 06/11/2009   SEBORRHEIC KERATOSIS 06/11/2009   LUMBAGO 06/11/2009    Past Surgical History:  Procedure Laterality Date   COLONOSCOPY     INNER EAR SURGERY     hole in eardrum 30 years ago       Home Medications    Prior to Admission medications   Medication Sig Start Date End Date Taking?  Authorizing Provider  predniSONE  (DELTASONE ) 20 MG tablet Take 2 tabs PO daily x 5 days. 05/04/24  Yes Teddy Ozell, FNP  amLODipine  (NORVASC ) 10 MG tablet Take 1 tablet (10 mg total) by mouth daily. 04/25/24   Alvan Dorothyann BIRCH, MD  ANORO ELLIPTA  62.5-25 MCG/ACT AEPB INHALE 1 PUFF INTO THE LUNGS IN THE MORNING. 02/08/24   Alvan Dorothyann BIRCH, MD  Ascorbic Acid (VITAMIN C) 1000 MG tablet Take 1,000 mg by mouth daily.    [provider]  atorvastatin  (LIPITOR) 10 MG tablet Take 1 tablet (10 mg total) by mouth at bedtime. 04/25/24   Alvan Dorothyann BIRCH, MD  escitalopram  (LEXAPRO ) 5 MG tablet Take 1 tablet (5 mg total) by mouth daily. 04/25/24   Alvan Dorothyann BIRCH, MD  fluticasone  (FLONASE ) 50 MCG/ACT nasal spray Place 2 sprays into both nostrils daily. 12/18/23   Alvan Dorothyann BIRCH, MD  Multiple Vitamins-Minerals (CENTRUM PO) Take 1 tablet by mouth daily.    [provider]    Family History Family History  Problem Relation Age of Onset   Cancer Father        mouth cancer     Social History Social History   Tobacco Use   Smoking status: Former    Current packs/day: 0.00    Types: Cigarettes    Quit date: 10/24/2020    Years since quitting: 3.5   Smokeless tobacco: Never  Vaping Use  Vaping status: Never Used  Substance Use Topics   Alcohol use: Yes    Alcohol/week: 0.0 standard drinks of alcohol    Comment: 2-3 drinks per week    Drug use: No     Allergies   Patient has no known allergies.   Review of Systems Review of Systems  Respiratory:  Positive for cough.   All other systems reviewed and are negative.    Physical Exam Triage Vital Signs ED Triage Vitals  Encounter Vitals Group     BP      Girls Systolic BP Percentile      Girls Diastolic BP Percentile      Boys Systolic BP Percentile      Boys Diastolic BP Percentile      Pulse      Resp      Temp      Temp src      SpO2      Weight      Height      Head Circumference       Peak Flow      Pain Score      Pain Loc      Pain Education      Exclude from Growth Chart    No data found.  Updated Vital Signs BP 137/77   Pulse 65   Temp 97.7 F (36.5 C)   Resp 19   SpO2 98%    Physical Exam Vitals and nursing note reviewed.  Constitutional:      Appearance: Normal appearance. He is normal weight.  HENT:     Head: Normocephalic and atraumatic.     Right Ear: Tympanic membrane, ear canal and external ear normal.     Left Ear: Tympanic membrane, ear canal and external ear normal.     Mouth/Throat:     Mouth: Mucous membranes are moist.     Pharynx: Oropharynx is clear.  Eyes:     Extraocular Movements: Extraocular movements intact.     Conjunctiva/sclera: Conjunctivae normal.     Pupils: Pupils are equal, round, and reactive to light.  Cardiovascular:     Rate and Rhythm: Normal rate and regular rhythm.     Pulses: Normal pulses.     Heart sounds: Normal heart sounds.  Pulmonary:     Effort: Pulmonary effort is normal.     Breath sounds: Normal breath sounds. No wheezing, rhonchi or rales.     Comments: Mild diffuse scattered rhonchi noted Musculoskeletal:        General: Normal range of motion.     Cervical back: Normal range of motion and neck supple.  Skin:    General: Skin is warm and dry.  Neurological:     General: No focal deficit present.     Mental Status: He is alert and oriented to person, place, and time. Mental status is at baseline.  Psychiatric:        Mood and Affect: Mood normal.        Behavior: Behavior normal.      UC Treatments / Results  Labs (all labs ordered are listed, but only abnormal results are displayed) Labs Reviewed - No data to display  EKG   Radiology No results found.  Procedures Procedures (including critical care time)  Medications Ordered in UC Medications - No data to display  Initial Impression / Assessment and Plan / UC Course  I have reviewed the triage vital signs and the nursing  notes.  Pertinent labs &  imaging results that were available during my care of the patient were reviewed by me and considered in my medical decision making (see chart for details).     MDM: 1.  COPD exacerbation-very mild, Rx'd prednisone  40 mg daily x 5 days. Advised patient to take medication as directed with food to completion.  Encouraged to increase daily water  intake to 64 ounces per day while taking his medication.  Advised if symptoms worsen and/or unresolved please follow with your PCP or here for further evaluation.  Patient discharged home, hemodynamically stable. Final Clinical Impressions(s) / UC Diagnoses   Final diagnoses:  COPD exacerbation [facility])     Discharge Instructions      Advised patient to take medication as directed with food to completion.  Encouraged to increase daily water  intake to 64 ounces per day while taking his medication.  Advised if symptoms worsen and/or unresolved please follow with your PCP or here for further evaluation.     ED Prescriptions     Medication Sig Dispense Auth. Provider   predniSONE  (DELTASONE ) 20 MG tablet Take 2 tabs PO daily x 5 days. 10 tablet Kamerin Axford, FNP      PDMP not reviewed this encounter.   Demaurion, Dicioccio, FNP 05/04/24 1016

## 2024-05-06 ENCOUNTER — Other Ambulatory Visit: Payer: Self-pay | Admitting: Family Medicine

## 2024-05-06 ENCOUNTER — Telehealth: Payer: Self-pay | Admitting: Family Medicine

## 2024-05-06 DIAGNOSIS — R790 Abnormal level of blood mineral: Secondary | ICD-10-CM

## 2024-05-06 DIAGNOSIS — N2889 Other specified disorders of kidney and ureter: Secondary | ICD-10-CM

## 2024-05-06 DIAGNOSIS — N281 Cyst of kidney, acquired: Secondary | ICD-10-CM

## 2024-05-06 NOTE — Progress Notes (Signed)
 Order placed for GI referral.

## 2024-05-06 NOTE — Telephone Encounter (Signed)
 We had referred him to nephrology for renal mass back in March.  Can you verify if patient went?  And if so then we need to get a copy of the office visit notes.

## 2024-05-07 NOTE — Telephone Encounter (Signed)
 His daughter states he was seen by nephrology. She will call back with the name of the provider.

## 2024-05-10 NOTE — Telephone Encounter (Signed)
 Patient's daughter advised.

## 2024-05-10 NOTE — Telephone Encounter (Signed)
 Orders Placed This Encounter  Procedures   MR Abdomen W Wo Contrast    Standing Status:   Future    Expiration Date:   05/10/2025    If indicated for the ordered procedure, I authorize the administration of contrast media per Radiology protocol:   Yes    What is the patient's sedation requirement?:   No Sedation    Does the patient have a pacemaker or implanted devices?:   No    Preferred imaging location?:   [facility] (table limit-500lbs)

## 2024-05-10 NOTE — Telephone Encounter (Signed)
 I am not sure where to send this I am not sure why we called him about who his urologist was?  I had placed a GI referral for anemia after I saw him.

## 2024-05-10 NOTE — Telephone Encounter (Signed)
 Call patient, think we got his report and I looked over it.  He is definitely due for repeat MRI of the renal mass.  It was technically due in April.  Urology had recommended a 1 year repeat.  If he has not had that done and does not have an upcoming appointment with them then I can place an order if he would like.

## 2024-06-25 ENCOUNTER — Encounter: Payer: Self-pay | Admitting: Sports Medicine

## 2024-07-12 ENCOUNTER — Telehealth: Payer: Self-pay

## 2024-07-12 DIAGNOSIS — F4329 Adjustment disorder with other symptoms: Secondary | ICD-10-CM

## 2024-07-12 NOTE — Telephone Encounter (Signed)
 Copied from CRM #[id number]. Topic: General - Other >> Jul 11, 2024  5:03 PM Corin V wrote: Reason for CRM: Patient son calling to see if patient would be able to see a Child psychotherapist in the office or if a home health social worker can be sent out to see him. He is struggling with his wife recently going into a facility for Alzheimer's and he is not coping well. He could benefit from resources with how to cope with her illness and watching her progression. He did not want to schedule as he is not sure what steps need to be taken. Please call patient at [phone]

## 2024-07-12 NOTE — Telephone Encounter (Signed)
 I think he would do well with therapy, see a counselor.  We have 2 great therapist here, a  male and male.  See if he would be ok with that. It would be here in our office.

## 2024-07-15 NOTE — Telephone Encounter (Signed)
 Attempted a return call to son at given # [phone]. Phone rang without answer. Could not leave a voice mail message.

## 2024-07-17 NOTE — Telephone Encounter (Signed)
 Attempted call to patient. Patient answered call but states the call was received at a bad time for him and requested that we call again at a later time.

## 2024-07-18 NOTE — Telephone Encounter (Signed)
 Patient's son called in, requesting we call him if patient refuses another call out to the patient. Sons number [facility] number is [phone]

## 2024-07-23 ENCOUNTER — Ambulatory Visit (INDEPENDENT_AMBULATORY_CARE_PROVIDER_SITE_OTHER)

## 2024-07-23 VITALS — Temp 98.0°F

## 2024-07-23 DIAGNOSIS — Z23 Encounter for immunization: Secondary | ICD-10-CM | POA: Diagnosis not present

## 2024-07-23 NOTE — Progress Notes (Signed)
 Patient is here today to receive High Dose Flu immunization. Temperature today is 98 degrees Farenheit. Injection was given in the left deltoid and was tolerated well by patient.

## 2024-07-25 NOTE — Addendum Note (Signed)
 Addended by: Ansley Mangiapane P on: 07/25/2024 11:57 AM   Modules accepted: Orders

## 2024-07-25 NOTE — Telephone Encounter (Signed)
 Spoke with patient and he gave verbal consent to speak with his son -Corporate treasurer. Patient will add son to [facility] at his next visit.  Spoke with christopher and he feels the counselor would be a great idea for the patient.  He lives in [place] and can not be in office with his father.  He is requesting a call to inform him once this referral has been placed.

## 2024-07-25 NOTE — Telephone Encounter (Signed)
 Referral placed for [facility] Requesting referral for nathanel collins or craig peters

## 2024-07-29 ENCOUNTER — Telehealth: Payer: Self-pay

## 2024-07-29 NOTE — Telephone Encounter (Signed)
 Patient son informed and given number to contact if he should decide to continue with referral to therapist / counselor

## 2024-07-29 NOTE — Telephone Encounter (Signed)
 Copied from CRM #[id number]. Topic: General - Other >> Jul 11, 2024  5:03 PM Corin V wrote: Reason for CRM: Patient son calling to see if patient would be able to see a Child psychotherapist in the office or if a home health social worker can be sent out to see him. He is struggling with his wife recently going into a facility for Alzheimer's and he is not coping well. He could benefit from resources with how to cope with her illness and watching her progression. He did not want to schedule as he is not sure what steps need to be taken. Please call patient at [phone] >> Jul 29, 2024 12:12 PM Montie POUR wrote: Please call [facility] back at [phone]. He missed a call and I tried to transfer to clinic but they were closed for lunch. Thanks

## 2024-07-29 NOTE — Telephone Encounter (Signed)
 Attempted call to [facility] to inform of patient denial of referral and to give number for scheduling if should change his mind in the future.

## 2024-08-10 ENCOUNTER — Other Ambulatory Visit: Payer: Self-pay

## 2024-08-10 ENCOUNTER — Ambulatory Visit
Admission: EM | Admit: 2024-08-10 | Discharge: 2024-08-10 | Disposition: A | Discharge status: Home / Self Care | Attending: Family Medicine | Admitting: Family Medicine

## 2024-08-10 ENCOUNTER — Ambulatory Visit

## 2024-08-10 DIAGNOSIS — M25431 Effusion, right wrist: Secondary | ICD-10-CM | POA: Diagnosis not present

## 2024-08-10 DIAGNOSIS — M25461 Effusion, right knee: Secondary | ICD-10-CM | POA: Diagnosis not present

## 2024-08-10 DIAGNOSIS — M7989 Other specified soft tissue disorders: Secondary | ICD-10-CM

## 2024-08-10 DIAGNOSIS — M25531 Pain in right wrist: Secondary | ICD-10-CM

## 2024-08-10 MED ORDER — OXYCODONE-ACETAMINOPHEN 5-325 MG PO TABS: 1.0000 | ORAL_TABLET | Freq: Three times a day (TID) | ORAL | 0 refills | Status: AC | PRN

## 2024-08-10 MED ORDER — PREDNISONE 20 MG PO TABS: ORAL_TABLET | ORAL | 0 refills | Status: AC

## 2024-08-10 NOTE — ED Provider Notes (Signed)
 [name]    CSN: [id number] Arrival date & time: 08/10/24  0940      History   Chief Complaint Chief Complaint  Patient presents with   right wrist pain    HPI Timothy Waller is a 89 y.o. male.   HPI Very pleasant 89 year old man presents with right wrist swelling.  PMH significant for COPD, osteoporosis, and HLD  Past Medical History:  Diagnosis Date   Abnormal weight loss    COPD (chronic obstructive pulmonary disease) (HCC)    Hyperlipidemia    Osteoporosis 09/01/2010   Qualifier: Diagnosis of  By: Merrell CMA, LEODIS), Andrea      Patient Active Problem List   Diagnosis Date Noted   Swelling of right knee 04/30/2024   Acquired renal cyst of right kidney 04/12/2022   Right kidney stone 04/12/2022   Stress and adjustment reaction 12/08/2021   Lung nodule 11/29/2020   Pleural effusion 11/29/2020   Cachexia 11/29/2020   Centrilobular emphysema (HCC) 11/29/2020   Benign localized prostatic hyperplasia with lower urinary tract symptoms (LUTS) 08/25/2020   Skin lesion of face 10/29/2019   Proteinuria 01/31/2018   Frequent urination 01/31/2018   Orthostasis 06/12/2017   Heavy tobacco smoker 06/12/2017   Benign colonic polyp 11/02/2016   Osteopenia 02/17/2016   Essential hypertension 02/09/2016   Special screening for malignant neoplasms, colon 03/05/2015   Hyperlipidemia 12/16/2014   COPD with emphysema (HCC) 12/15/2014   SHOULDER PAIN 08/24/2010   HEMATURIA UNSPECIFIED 06/11/2009   ERECTILE DYSFUNCTION, ORGANIC 06/11/2009   SEBORRHEIC KERATOSIS 06/11/2009   LUMBAGO 06/11/2009    Past Surgical History:  Procedure Laterality Date   COLONOSCOPY     INNER EAR SURGERY     hole in eardrum 30 years ago       Home Medications    Prior to Admission medications   Medication Sig Start Date End Date Taking? Authorizing Provider  oxyCODONE -acetaminophen (PERCOCET/ROXICET) 5-325 MG tablet Take 1 tablet by mouth every 8 (eight) hours as needed  for severe pain (pain score 7-10). 08/10/24  Yes Teddy Ozell, FNP  predniSONE  (DELTASONE ) 20 MG tablet Take 3 tabs PO daily x 5 days. 08/10/24  Yes Teddy Ozell, FNP  amLODipine  (NORVASC ) 10 MG tablet Take 1 tablet (10 mg total) by mouth daily. 04/25/24   Alvan Dorothyann BIRCH, MD  ANORO ELLIPTA  62.5-25 MCG/ACT AEPB INHALE 1 PUFF INTO THE LUNGS IN THE MORNING. 02/08/24   Alvan Dorothyann BIRCH, MD  Ascorbic Acid (VITAMIN C) 1000 MG tablet Take 1,000 mg by mouth daily.    [provider]  atorvastatin  (LIPITOR) 10 MG tablet Take 1 tablet (10 mg total) by mouth at bedtime. 04/25/24   Alvan Dorothyann BIRCH, MD  escitalopram  (LEXAPRO ) 5 MG tablet Take 1 tablet (5 mg total) by mouth daily. 04/25/24   Alvan Dorothyann BIRCH, MD  fluticasone  (FLONASE ) 50 MCG/ACT nasal spray Place 2 sprays into both nostrils daily. 12/18/23   Alvan Dorothyann BIRCH, MD  Multiple Vitamins-Minerals (CENTRUM PO) Take 1 tablet by mouth daily.    [provider]    Family History Family History  Problem Relation Age of Onset   Cancer Father        mouth cancer     Social History Social History   Tobacco Use   Smoking status: Former    Current packs/day: 0.00    Types: Cigarettes    Quit date: 10/24/2020    Years since quitting: 3.7   Smokeless tobacco: Never  Vaping Use  Vaping status: Never Used  Substance Use Topics   Alcohol use: Yes    Alcohol/week: 0.0 standard drinks of alcohol    Comment: 2-3 drinks per week    Drug use: No     Allergies   Patient has no known allergies.   Review of Systems Review of Systems  Musculoskeletal:        Right hand pain/right wrist swelling x 2 days, patient denies injury or insult     Physical Exam Triage Vital Signs ED Triage Vitals  Encounter Vitals Group     BP      Girls Systolic BP Percentile      Girls Diastolic BP Percentile      Boys Systolic BP Percentile      Boys Diastolic BP Percentile      Pulse      Resp      Temp      Temp  src      SpO2      Weight      Height      Head Circumference      Peak Flow      Pain Score      Pain Loc      Pain Education      Exclude from Growth Chart    No data found.  Updated Vital Signs BP (!) 173/74   Pulse 76   Temp 97.8 F (36.6 C)   Resp 19   SpO2 98%    Physical Exam Vitals and nursing note reviewed.  Constitutional:      Appearance: Normal appearance. He is normal weight.  HENT:     Head: Normocephalic and atraumatic.     Mouth/Throat:     Mouth: Mucous membranes are moist.     Pharynx: Oropharynx is clear.  Eyes:     Extraocular Movements: Extraocular movements intact.     Conjunctiva/sclera: Conjunctivae normal.     Pupils: Pupils are equal, round, and reactive to light.  Cardiovascular:     Rate and Rhythm: Normal rate and regular rhythm.     Pulses: Normal pulses.     Heart sounds: Normal heart sounds.  Pulmonary:     Effort: Pulmonary effort is normal.     Breath sounds: Normal breath sounds. No wheezing, rhonchi or rales.  Musculoskeletal:        General: Normal range of motion.     Comments: Right hand/right wrist (dorsum): Moderate soft tissue swelling noted please see image below, grip is 1/5, neurovascular/neurosensory intact, brisk cap refill  Skin:    General: Skin is warm and dry.  Neurological:     General: No focal deficit present.     Mental Status: He is alert and oriented to person, place, and time. Mental status is at baseline.  Psychiatric:        Mood and Affect: Mood normal.        Behavior: Behavior normal.      UC Treatments / Results  Labs (all labs ordered are listed, but only abnormal results are displayed) Labs Reviewed - No data to display  EKG   Radiology DG Hand Complete Right Result Date: 08/10/2024 CLINICAL DATA:  Right hand swelling x 2 days; Right wrist pain x 2 days EXAM: RIGHT HAND - COMPLETE 3+ VIEW; RIGHT WRIST - COMPLETE 3+ VIEW COMPARISON:  None Available. FINDINGS: Osteopenia. There is an  osseous fleck along the dorsum of the rest most consistent with age-indeterminate sequela of triquetral fracture. There  is osseous remodeling of the radial carpal joint with extensive subcortical cyst formation throughout the scaphoid. There is extensive joint space narrowing throughout the carpal bones. There is joint space narrowing of the second and third MCP with subcortical cyst formation along the third MCP. There is a possible erosive lucency at the radial base of the radius and scaphoid. There is erosion of the radial head of the middle third phalanx with adjacent subcortical cyst formation and apex ulnar angulation of the DIP. There is joint space narrowing throughout multiple DIPs, most predominately the second, third and fifth. Joint space narrowing subcortical cyst formation of the second PIP. No unexpected radiopaque foreign body. Soft tissue swelling abutting the wrist. IMPRESSION: 1. There is an osseous fleck along the dorsum of the wrist most consistent with age-indeterminate sequela of triquetral fracture. Recommend correlation with point tenderness. 2. There is multifocal joint space narrowing throughout the carpal bones and second and third MCP. There is possible erosive lucency at the radial base of the scaphoid. There is also a possible erosion of the radial head of the middle third phalanx with adjacent subcortical cyst formation and apex ulnar angulation of the DIP. Findings are favored to reflect sequela of inflammatory arthropathy such as rheumatoid arthritis although differential considerations would inflammatory osteoarthritis or other nonspecific inflammatory arthropathy. Recommend correlation with laboratory values. 3. If there is a persistent clinical concern for scaphoid fracture, recommend immobilization and follow-up radiographs in 2 weeks versus MRI. Electronically Signed   By: Corean Salter M.D.   On: 08/10/2024 11:10   DG Wrist Complete Right Result Date:  08/10/2024 CLINICAL DATA:  Right hand swelling x 2 days; Right wrist pain x 2 days EXAM: RIGHT HAND - COMPLETE 3+ VIEW; RIGHT WRIST - COMPLETE 3+ VIEW COMPARISON:  None Available. FINDINGS: Osteopenia. There is an osseous fleck along the dorsum of the rest most consistent with age-indeterminate sequela of triquetral fracture. There is osseous remodeling of the radial carpal joint with extensive subcortical cyst formation throughout the scaphoid. There is extensive joint space narrowing throughout the carpal bones. There is joint space narrowing of the second and third MCP with subcortical cyst formation along the third MCP. There is a possible erosive lucency at the radial base of the radius and scaphoid. There is erosion of the radial head of the middle third phalanx with adjacent subcortical cyst formation and apex ulnar angulation of the DIP. There is joint space narrowing throughout multiple DIPs, most predominately the second, third and fifth. Joint space narrowing subcortical cyst formation of the second PIP. No unexpected radiopaque foreign body. Soft tissue swelling abutting the wrist. IMPRESSION: 1. There is an osseous fleck along the dorsum of the wrist most consistent with age-indeterminate sequela of triquetral fracture. Recommend correlation with point tenderness. 2. There is multifocal joint space narrowing throughout the carpal bones and second and third MCP. There is possible erosive lucency at the radial base of the scaphoid. There is also a possible erosion of the radial head of the middle third phalanx with adjacent subcortical cyst formation and apex ulnar angulation of the DIP. Findings are favored to reflect sequela of inflammatory arthropathy such as rheumatoid arthritis although differential considerations would inflammatory osteoarthritis or other nonspecific inflammatory arthropathy. Recommend correlation with laboratory values. 3. If there is a persistent clinical concern for scaphoid  fracture, recommend immobilization and follow-up radiographs in 2 weeks versus MRI. Electronically Signed   By: Corean Salter M.D.   On: 08/10/2024 11:10    Procedures Procedures (  including critical care time)  Medications Ordered in UC Medications - No data to display  Initial Impression / Assessment and Plan / UC Course  I have reviewed the triage vital signs and the nursing notes.  Pertinent labs & imaging results that were available during my care of the patient were reviewed by me and considered in my medical decision making (see chart for details).     MDM: 1.  Swelling of right hand-right hand x-ray results revealed above, patient advised concerning for possible triquetral fracture, Rx'd prednisone  20 mg tablet: Take 3 tablets p.o. daily x 5 days, Rx'd Percocet 5/325 mg tablet: Take 1 tablet every 8 hours, as needed for severe right hand pain; 2.  Right wrist pain-right wrist x-ray results revealed above, right hand wrapped in ace wrap prior to discharge today, Rx Percocet 5/325 mg tablet: Take 1 tablet every 8 hours as needed for severe right wrist pain/swelling. Advised patient of right wrist and right hand x-ray results with hardcopy and images provided.  Advised patient take medication as directed with food to completion.  Advised may use Percocet for acute/severe right wrist/right hand pain.  Patient advised of sedative effects of this medication.  Encouraged increase daily water  intake to 64 ounces per day while taking these medications.  Advised patient to follow-up with [facility] orthopedics for further evaluation of possible triquetral fracture.  Contact information provided with this AVS today.   Please note diagnosis of swelling of the right knee entered in error and accidentally associated with left hand/left wrist x-rays today. Final Clinical Impressions(s) / UC Diagnoses   Final diagnoses:  Right wrist pain  Swelling of right hand  Swelling of right knee      Discharge Instructions      Advised patient of right wrist and right hand x-ray results with hardcopy and images provided.  Advised patient take medication as directed with food to completion.  Advised may use Percocet for acute/severe right wrist/right hand pain.  Patient advised of sedative effects of this medication.  Encouraged increase daily water  intake to 64 ounces per day while taking these medications.  Advised patient to follow-up with [facility] orthopedics for further evaluation of possible triquetral fracture.  Contact information provided with this AVS today.     ED Prescriptions     Medication Sig Dispense Auth. Provider   predniSONE  (DELTASONE ) 20 MG tablet Take 3 tabs PO daily x 5 days. 15 tablet Tayleigh Wetherell, FNP   oxyCODONE -acetaminophen (PERCOCET/ROXICET) 5-325 MG tablet Take 1 tablet by mouth every 8 (eight) hours as needed for severe pain (pain score 7-10). 15 tablet Tevis Conger, FNP      I have reviewed the PDMP during this encounter.   Silas, Muff, FNP 08/10/24 1156

## 2024-08-10 NOTE — Discharge Instructions (Addendum)
 Advised patient of right wrist and right hand x-ray results with hardcopy and images provided.  Advised patient take medication as directed with food to completion.  Advised may use Percocet for acute/severe right wrist/right hand pain.  Patient advised of sedative effects of this medication.  Encouraged increase daily water  intake to 64 ounces per day while taking these medications.  Advised patient to follow-up with [facility] orthopedics for further evaluation of possible triquetral fracture.  Contact information provided with this AVS today.

## 2024-08-10 NOTE — ED Triage Notes (Signed)
 Pt presents to uc with right wrist pain and swelling for 2 days. Pt reports no known injury. Pt presents with redness and mild nonpitting edema to right wrist. Denies history of gout. Has been taking motrin  for pain.

## 2024-08-27 ENCOUNTER — Encounter: Payer: Self-pay | Admitting: Emergency Medicine

## 2024-08-27 ENCOUNTER — Ambulatory Visit
Admission: EM | Admit: 2024-08-27 | Discharge: 2024-08-27 | Disposition: A | Discharge status: Home / Self Care | Attending: Family Medicine | Admitting: Family Medicine

## 2024-08-27 DIAGNOSIS — H6123 Impacted cerumen, bilateral: Secondary | ICD-10-CM

## 2024-08-27 NOTE — ED Provider Notes (Signed)
 [name]    CSN: [id number] Arrival date & time: 08/27/24  0956      History   Chief Complaint Chief Complaint  Patient presents with   Cerumen Impaction    HPI Timothy Waller is a 89 y.o. male.   Pleasant 89 year old gentleman.  States he has had problems with his ears all my life.  Comes in every year or so for cerumen impaction.  Has been unable to relieve this at home.  Would like to have ears irrigated.  Hearing is diminished.  Otherwise feeling well.    Past Medical History:  Diagnosis Date   Abnormal weight loss    COPD (chronic obstructive pulmonary disease) (HCC)    Hyperlipidemia    Osteoporosis 09/01/2010   Qualifier: Diagnosis of  By: Merrell CMA, LEODIS), Andrea      Patient Active Problem List   Diagnosis Date Noted   Swelling of right knee 04/30/2024   Acquired renal cyst of right kidney 04/12/2022   Right kidney stone 04/12/2022   Stress and adjustment reaction 12/08/2021   Lung nodule 11/29/2020   Pleural effusion 11/29/2020   Cachexia 11/29/2020   Centrilobular emphysema (HCC) 11/29/2020   Benign localized prostatic hyperplasia with lower urinary tract symptoms (LUTS) 08/25/2020   Skin lesion of face 10/29/2019   Proteinuria 01/31/2018   Frequent urination 01/31/2018   Orthostasis 06/12/2017   Heavy tobacco smoker 06/12/2017   Benign colonic polyp 11/02/2016   Osteopenia 02/17/2016   Essential hypertension 02/09/2016   Special screening for malignant neoplasms, colon 03/05/2015   Hyperlipidemia 12/16/2014   COPD with emphysema (HCC) 12/15/2014   SHOULDER PAIN 08/24/2010   HEMATURIA UNSPECIFIED 06/11/2009   ERECTILE DYSFUNCTION, ORGANIC 06/11/2009   SEBORRHEIC KERATOSIS 06/11/2009   LUMBAGO 06/11/2009    Past Surgical History:  Procedure Laterality Date   COLONOSCOPY     INNER EAR SURGERY     hole in eardrum 30 years ago       Home Medications    Prior to Admission medications   Medication Sig Start Date End  Date Taking? Authorizing Provider  amLODipine  (NORVASC ) 10 MG tablet Take 1 tablet (10 mg total) by mouth daily. 04/25/24  Yes Alvan Dorothyann BIRCH, MD  ANORO ELLIPTA  62.5-25 MCG/ACT AEPB INHALE 1 PUFF INTO THE LUNGS IN THE MORNING. 02/08/24  Yes Alvan Dorothyann BIRCH, MD  Ascorbic Acid (VITAMIN C) 1000 MG tablet Take 1,000 mg by mouth daily.   Yes [provider]  atorvastatin  (LIPITOR) 10 MG tablet Take 1 tablet (10 mg total) by mouth at bedtime. 04/25/24  Yes Alvan Dorothyann BIRCH, MD  escitalopram  (LEXAPRO ) 5 MG tablet Take 1 tablet (5 mg total) by mouth daily. 04/25/24  Yes Alvan Dorothyann BIRCH, MD  fluticasone  (FLONASE ) 50 MCG/ACT nasal spray Place 2 sprays into both nostrils daily. 12/18/23  Yes Alvan Dorothyann BIRCH, MD  Multiple Vitamins-Minerals (CENTRUM PO) Take 1 tablet by mouth daily.   Yes [provider]  oxyCODONE -acetaminophen (PERCOCET/ROXICET) 5-325 MG tablet Take 1 tablet by mouth every 8 (eight) hours as needed for severe pain (pain score 7-10). 08/10/24  Yes Teddy Ozell, FNP  predniSONE  (DELTASONE ) 20 MG tablet Take 3 tabs PO daily x 5 days. 08/10/24  Yes Teddy Ozell, FNP    Family History Family History  Problem Relation Age of Onset   Cancer Father        mouth cancer     Social History Social History   Tobacco Use   Smoking status: Former  Current packs/day: 0.00    Types: Cigarettes    Quit date: 10/24/2020    Years since quitting: 3.8   Smokeless tobacco: Never  Vaping Use   Vaping status: Never Used  Substance Use Topics   Alcohol use: Yes    Alcohol/week: 0.0 standard drinks of alcohol    Comment: 2-3 drinks per week    Drug use: No     Allergies   Patient has no known allergies.   Review of Systems Review of Systems See HPI  Physical Exam Triage Vital Signs ED Triage Vitals  Encounter Vitals Group     BP 08/27/24 1011 (!) 161/83     Girls Systolic BP Percentile --      Girls Diastolic BP Percentile --      Boys  Systolic BP Percentile --      Boys Diastolic BP Percentile --      Pulse Rate 08/27/24 1011 75     Resp 08/27/24 1011 18     Temp 08/27/24 1011 97.7 F (36.5 C)     Temp Source 08/27/24 1011 Oral     SpO2 08/27/24 1011 93 %     Weight 08/27/24 1010 110 lb (49.9 kg)     Height 08/27/24 1010 5' 9 (1.753 m)     Head Circumference --      Peak Flow --      Pain Score 08/27/24 1010 0     Pain Loc --      Pain Education --      Exclude from Growth Chart --    No data found.  Updated Vital Signs BP (!) 161/83 (BP Location: Right Arm)   Pulse 75   Temp 97.7 F (36.5 C) (Oral)   Resp 18   Ht 5' 9 (1.753 m)   Wt 49.9 kg   SpO2 93%   BMI 16.24 kg/m       Physical Exam Constitutional:      General: He is not in acute distress.    Appearance: He is well-developed.     Comments: Appears thin and somewhat frail.  HENT:     Head: Normocephalic and atraumatic.     Right Ear: There is impacted cerumen.     Left Ear: There is impacted cerumen.  Eyes:     Conjunctiva/sclera: Conjunctivae normal.     Pupils: Pupils are equal, round, and reactive to light.  Cardiovascular:     Rate and Rhythm: Normal rate.  Pulmonary:     Effort: Pulmonary effort is normal. No respiratory distress.  Musculoskeletal:        General: Normal range of motion.     Cervical back: Normal range of motion.  Skin:    General: Skin is warm and dry.  Neurological:     Mental Status: He is alert.      UC Treatments / Results  Labs (all labs ordered are listed, but only abnormal results are displayed) Labs Reviewed - No data to display  EKG   Radiology No results found.  Procedures Procedures (including critical care time)  Medications Ordered in UC Medications - No data to display  Initial Impression / Assessment and Plan / UC Course  I have reviewed the triage vital signs and the nursing notes.  Pertinent labs & imaging results that were available during my care of the patient were  reviewed by me and considered in my medical decision making (see chart for details).     After irrigation TMs  are clear Final Clinical Impressions(s) / UC Diagnoses   Final diagnoses:  Bilateral hearing loss due to cerumen impaction     Discharge Instructions      Return as needed     ED Prescriptions   None    PDMP not reviewed this encounter.   Maranda Jamee Jacob, MD 08/27/24 1040

## 2024-08-27 NOTE — Discharge Instructions (Signed)
 Return as needed

## 2024-08-27 NOTE — ED Triage Notes (Signed)
 Patient c/o bilateral cerumen impaction in both ears.  States he has this done yearly.  Denies any other sx's.

## 2024-11-04 ENCOUNTER — Encounter: Payer: Self-pay | Admitting: Family Medicine

## 2024-11-12 ENCOUNTER — Telehealth: Payer: Self-pay | Admitting: Family Medicine

## 2024-11-12 NOTE — Telephone Encounter (Signed)
 Copied from CRM [id number]. Topic: General - Other >> Nov 12, 2024  3:23 PM Winona R wrote: Pt son calling to ask for any social work help as his mom just past and his dad is not processing it well. Pt son Medford Biedermann does not believe he will willing speak to anyone but he really want to know know what's avail so he can try to convince the pt to accept the help. Pt isnt eating properly

## 2024-11-26 ENCOUNTER — Telehealth: Payer: Self-pay | Admitting: Family Medicine

## 2024-11-27 ENCOUNTER — Ambulatory Visit: Admitting: Family Medicine

## 2024-11-27 NOTE — Telephone Encounter (Signed)
 Attempted to contact the patient's son Medford at [phone]. No answer, unable to leave a vm msg. Vm was full.

## 2024-12-24 ENCOUNTER — Ambulatory Visit: Admitting: Family Medicine
